# Patient Record
Sex: Male | Born: 1960 | Race: White | Hispanic: No | Smoking: Former smoker
Health system: Southern US, Community
[De-identification: ages and names within clinical notes are randomized; demographics above are authoritative.]

## PROBLEM LIST (undated history)

## (undated) DIAGNOSIS — N529 Male erectile dysfunction, unspecified: Secondary | ICD-10-CM

## (undated) DIAGNOSIS — T7840XA Allergy, unspecified, initial encounter: Secondary | ICD-10-CM

## (undated) DIAGNOSIS — M109 Gout, unspecified: Secondary | ICD-10-CM

## (undated) DIAGNOSIS — M1990 Unspecified osteoarthritis, unspecified site: Secondary | ICD-10-CM

## (undated) DIAGNOSIS — I10 Essential (primary) hypertension: Secondary | ICD-10-CM

## (undated) DIAGNOSIS — E785 Hyperlipidemia, unspecified: Secondary | ICD-10-CM

## (undated) DIAGNOSIS — K219 Gastro-esophageal reflux disease without esophagitis: Secondary | ICD-10-CM

## (undated) DIAGNOSIS — C4491 Basal cell carcinoma of skin, unspecified: Secondary | ICD-10-CM

## (undated) DIAGNOSIS — G4700 Insomnia, unspecified: Secondary | ICD-10-CM

## (undated) DIAGNOSIS — F419 Anxiety disorder, unspecified: Secondary | ICD-10-CM

## (undated) DIAGNOSIS — Z9889 Other specified postprocedural states: Secondary | ICD-10-CM

## (undated) DIAGNOSIS — R112 Nausea with vomiting, unspecified: Secondary | ICD-10-CM

## (undated) DIAGNOSIS — M25532 Pain in left wrist: Secondary | ICD-10-CM

## (undated) DIAGNOSIS — C801 Malignant (primary) neoplasm, unspecified: Secondary | ICD-10-CM

## (undated) HISTORY — PX: OTHER SURGICAL HISTORY: SHX169

## (undated) HISTORY — PX: COLONOSCOPY: SHX174

## (undated) HISTORY — DX: Unspecified osteoarthritis, unspecified site: M19.90

## (undated) HISTORY — PX: JOINT REPLACEMENT: SHX530

## (undated) HISTORY — DX: Hyperlipidemia, unspecified: E78.5

## (undated) HISTORY — DX: Male erectile dysfunction, unspecified: N52.9

## (undated) HISTORY — DX: Gastro-esophageal reflux disease without esophagitis: K21.9

## (undated) HISTORY — DX: Pain in left wrist: M25.532

## (undated) HISTORY — DX: Essential (primary) hypertension: I10

## (undated) HISTORY — DX: Insomnia, unspecified: G47.00

## (undated) HISTORY — DX: Allergy, unspecified, initial encounter: T78.40XA

## (undated) HISTORY — PX: TOTAL HIP ARTHROPLASTY: SHX124

## (undated) HISTORY — DX: Malignant (primary) neoplasm, unspecified: C80.1

## (undated) HISTORY — DX: Basal cell carcinoma of skin, unspecified: C44.91

## (undated) HISTORY — DX: Anxiety disorder, unspecified: F41.9

## (undated) HISTORY — DX: Gout, unspecified: M10.9

## (undated) HISTORY — PX: TONSILLECTOMY: SUR1361

---

## 1998-06-10 ENCOUNTER — Encounter (HOSPITAL_COMMUNITY): Admission: RE | Admit: 1998-06-10 | Discharge: 1998-09-08 | Payer: Self-pay | Admitting: Psychiatry

## 2002-03-16 ENCOUNTER — Encounter (INDEPENDENT_AMBULATORY_CARE_PROVIDER_SITE_OTHER): Payer: Self-pay

## 2002-03-16 ENCOUNTER — Ambulatory Visit (HOSPITAL_COMMUNITY): Admission: RE | Admit: 2002-03-16 | Discharge: 2002-03-16 | Payer: Self-pay | Admitting: Gastroenterology

## 2003-03-26 ENCOUNTER — Encounter: Payer: Self-pay | Admitting: Internal Medicine

## 2004-03-15 ENCOUNTER — Ambulatory Visit: Payer: Self-pay | Admitting: Internal Medicine

## 2004-07-05 ENCOUNTER — Ambulatory Visit: Payer: Self-pay | Admitting: Internal Medicine

## 2004-07-20 ENCOUNTER — Ambulatory Visit: Payer: Self-pay | Admitting: Internal Medicine

## 2004-09-13 ENCOUNTER — Ambulatory Visit: Payer: Self-pay | Admitting: Internal Medicine

## 2004-10-09 ENCOUNTER — Ambulatory Visit: Payer: Self-pay | Admitting: Internal Medicine

## 2005-04-19 ENCOUNTER — Encounter: Admission: RE | Admit: 2005-04-19 | Discharge: 2005-04-19 | Payer: Self-pay | Admitting: General Surgery

## 2005-06-08 ENCOUNTER — Ambulatory Visit (HOSPITAL_COMMUNITY): Admission: RE | Admit: 2005-06-08 | Discharge: 2005-06-08 | Payer: Self-pay | Admitting: Surgery

## 2006-04-15 ENCOUNTER — Ambulatory Visit: Payer: Self-pay | Admitting: Internal Medicine

## 2006-04-23 ENCOUNTER — Ambulatory Visit: Payer: Self-pay | Admitting: Internal Medicine

## 2006-04-23 LAB — CONVERTED CEMR LAB
BUN: 15 mg/dL (ref 6–23)
Chol/HDL Ratio, serum: 6.6
Cholesterol: 228 mg/dL (ref 0–200)
Glucose, Bld: 95 mg/dL (ref 70–99)
HDL: 34.4 mg/dL — ABNORMAL LOW (ref >39.0)
LDL DIRECT: 175.7 mg/dL
Triglyceride fasting, serum: 89 mg/dL (ref 0–149)
VLDL: 18 mg/dL (ref 0–40)

## 2006-05-07 HISTORY — PX: HERNIA REPAIR: SHX51

## 2006-05-15 ENCOUNTER — Ambulatory Visit: Payer: Self-pay | Admitting: Internal Medicine

## 2006-05-15 LAB — CONVERTED CEMR LAB: Total CK: 209 units/L (ref 7–195)

## 2006-05-28 ENCOUNTER — Encounter: Admission: RE | Admit: 2006-05-28 | Discharge: 2006-05-28 | Payer: Self-pay | Admitting: Surgery

## 2006-05-30 ENCOUNTER — Ambulatory Visit: Payer: Self-pay | Admitting: Internal Medicine

## 2006-06-07 ENCOUNTER — Encounter: Admission: RE | Admit: 2006-06-07 | Discharge: 2006-06-07 | Payer: Self-pay | Admitting: Surgery

## 2006-07-16 ENCOUNTER — Encounter: Admission: RE | Admit: 2006-07-16 | Discharge: 2006-07-16 | Payer: Self-pay | Admitting: Surgery

## 2006-11-20 ENCOUNTER — Ambulatory Visit: Payer: Self-pay | Admitting: Internal Medicine

## 2006-11-20 DIAGNOSIS — R109 Unspecified abdominal pain: Secondary | ICD-10-CM | POA: Insufficient documentation

## 2006-12-02 ENCOUNTER — Telehealth (INDEPENDENT_AMBULATORY_CARE_PROVIDER_SITE_OTHER): Payer: Self-pay | Admitting: *Deleted

## 2006-12-03 ENCOUNTER — Telehealth (INDEPENDENT_AMBULATORY_CARE_PROVIDER_SITE_OTHER): Payer: Self-pay | Admitting: *Deleted

## 2006-12-05 ENCOUNTER — Encounter: Payer: Self-pay | Admitting: Internal Medicine

## 2006-12-19 ENCOUNTER — Encounter (INDEPENDENT_AMBULATORY_CARE_PROVIDER_SITE_OTHER): Payer: Self-pay | Admitting: *Deleted

## 2007-01-16 ENCOUNTER — Encounter: Payer: Self-pay | Admitting: Internal Medicine

## 2007-04-17 ENCOUNTER — Encounter: Payer: Self-pay | Admitting: Internal Medicine

## 2007-08-07 ENCOUNTER — Telehealth (INDEPENDENT_AMBULATORY_CARE_PROVIDER_SITE_OTHER): Payer: Self-pay | Admitting: *Deleted

## 2008-01-09 ENCOUNTER — Encounter
Admission: RE | Admit: 2008-01-09 | Discharge: 2008-04-08 | Payer: Self-pay | Admitting: Physical Medicine & Rehabilitation

## 2008-01-13 ENCOUNTER — Ambulatory Visit: Payer: Self-pay | Admitting: Physical Medicine & Rehabilitation

## 2008-02-05 ENCOUNTER — Ambulatory Visit: Payer: Self-pay | Admitting: Physical Medicine & Rehabilitation

## 2008-02-26 ENCOUNTER — Encounter: Payer: Self-pay | Admitting: Internal Medicine

## 2008-04-23 ENCOUNTER — Ambulatory Visit: Payer: Self-pay | Admitting: Internal Medicine

## 2008-04-23 DIAGNOSIS — G5600 Carpal tunnel syndrome, unspecified upper limb: Secondary | ICD-10-CM | POA: Insufficient documentation

## 2008-04-23 DIAGNOSIS — E785 Hyperlipidemia, unspecified: Secondary | ICD-10-CM | POA: Insufficient documentation

## 2008-04-23 LAB — CONVERTED CEMR LAB
Free T4: 0.8 ng/dL (ref 0.6–1.6)
TSH: 3 microintl units/mL (ref 0.35–5.50)

## 2008-04-26 ENCOUNTER — Encounter (INDEPENDENT_AMBULATORY_CARE_PROVIDER_SITE_OTHER): Payer: Self-pay | Admitting: *Deleted

## 2008-06-09 ENCOUNTER — Ambulatory Visit: Payer: Self-pay | Admitting: Internal Medicine

## 2008-06-09 LAB — CONVERTED CEMR LAB
AST: 20 units/L (ref 0–37)
Alkaline Phosphatase: 64 units/L (ref 39–117)
Bilirubin, Direct: 0.1 mg/dL (ref 0.0–0.3)
Total Protein: 7.2 g/dL (ref 6.0–8.3)
VLDL: 26 mg/dL (ref 0–40)

## 2008-06-15 ENCOUNTER — Ambulatory Visit: Payer: Self-pay | Admitting: Internal Medicine

## 2008-06-15 DIAGNOSIS — F528 Other sexual dysfunction not due to a substance or known physiological condition: Secondary | ICD-10-CM | POA: Insufficient documentation

## 2008-06-15 DIAGNOSIS — M25551 Pain in right hip: Secondary | ICD-10-CM | POA: Insufficient documentation

## 2008-06-15 DIAGNOSIS — M2550 Pain in unspecified joint: Secondary | ICD-10-CM | POA: Insufficient documentation

## 2008-06-15 LAB — CONVERTED CEMR LAB
HDL goal, serum: 40 mg/dL
LDL Goal: 100 mg/dL

## 2008-06-18 ENCOUNTER — Telehealth (INDEPENDENT_AMBULATORY_CARE_PROVIDER_SITE_OTHER): Payer: Self-pay | Admitting: *Deleted

## 2008-06-18 ENCOUNTER — Encounter (INDEPENDENT_AMBULATORY_CARE_PROVIDER_SITE_OTHER): Payer: Self-pay | Admitting: *Deleted

## 2008-06-18 LAB — CONVERTED CEMR LAB
Free T4: 0.8 ng/dL (ref 0.6–1.6)
Sed Rate: 7 mm/hr (ref 0–16)
TSH: 3.39 microintl units/mL (ref 0.35–5.50)
Uric Acid, Serum: 8.4 mg/dL — ABNORMAL HIGH (ref 4.0–7.8)

## 2008-06-21 ENCOUNTER — Telehealth (INDEPENDENT_AMBULATORY_CARE_PROVIDER_SITE_OTHER): Payer: Self-pay | Admitting: *Deleted

## 2008-06-23 ENCOUNTER — Telehealth (INDEPENDENT_AMBULATORY_CARE_PROVIDER_SITE_OTHER): Payer: Self-pay | Admitting: *Deleted

## 2008-09-17 ENCOUNTER — Ambulatory Visit: Payer: Self-pay | Admitting: Internal Medicine

## 2008-09-30 LAB — CONVERTED CEMR LAB
LDL Cholesterol: 134 mg/dL — ABNORMAL HIGH (ref 0–99)
Uric Acid, Serum: 8 mg/dL — ABNORMAL HIGH (ref 4.0–7.8)
VLDL: 34 mg/dL (ref 0.0–40.0)

## 2008-10-07 ENCOUNTER — Telehealth (INDEPENDENT_AMBULATORY_CARE_PROVIDER_SITE_OTHER): Payer: Self-pay | Admitting: *Deleted

## 2008-10-11 ENCOUNTER — Ambulatory Visit: Payer: Self-pay | Admitting: Internal Medicine

## 2008-10-11 DIAGNOSIS — M109 Gout, unspecified: Secondary | ICD-10-CM | POA: Insufficient documentation

## 2008-10-11 LAB — CONVERTED CEMR LAB: LDL Goal: 115 mg/dL

## 2009-04-19 ENCOUNTER — Encounter: Payer: Self-pay | Admitting: Internal Medicine

## 2009-04-19 ENCOUNTER — Telehealth (INDEPENDENT_AMBULATORY_CARE_PROVIDER_SITE_OTHER): Payer: Self-pay | Admitting: *Deleted

## 2009-06-08 ENCOUNTER — Ambulatory Visit: Payer: Self-pay | Admitting: Internal Medicine

## 2009-06-08 DIAGNOSIS — R071 Chest pain on breathing: Secondary | ICD-10-CM | POA: Insufficient documentation

## 2009-06-08 DIAGNOSIS — R1011 Right upper quadrant pain: Secondary | ICD-10-CM | POA: Insufficient documentation

## 2009-06-09 LAB — CONVERTED CEMR LAB
Basophils Absolute: 0 10*3/uL (ref 0.0–0.1)
Lymphocytes Relative: 26.3 % (ref 12.0–46.0)
Monocytes Relative: 9.3 % (ref 3.0–12.0)
Platelets: 135 10*3/uL — ABNORMAL LOW (ref 150.0–400.0)
RDW: 12.4 % (ref 11.5–14.6)

## 2009-07-11 ENCOUNTER — Telehealth (INDEPENDENT_AMBULATORY_CARE_PROVIDER_SITE_OTHER): Payer: Self-pay | Admitting: *Deleted

## 2009-10-16 ENCOUNTER — Encounter: Payer: Self-pay | Admitting: Internal Medicine

## 2009-10-19 ENCOUNTER — Telehealth (INDEPENDENT_AMBULATORY_CARE_PROVIDER_SITE_OTHER): Payer: Self-pay | Admitting: *Deleted

## 2009-10-24 ENCOUNTER — Telehealth (INDEPENDENT_AMBULATORY_CARE_PROVIDER_SITE_OTHER): Payer: Self-pay | Admitting: *Deleted

## 2009-12-19 ENCOUNTER — Telehealth (INDEPENDENT_AMBULATORY_CARE_PROVIDER_SITE_OTHER): Payer: Self-pay | Admitting: *Deleted

## 2009-12-30 ENCOUNTER — Ambulatory Visit: Payer: Self-pay | Admitting: Internal Medicine

## 2009-12-30 ENCOUNTER — Telehealth (INDEPENDENT_AMBULATORY_CARE_PROVIDER_SITE_OTHER): Payer: Self-pay | Admitting: *Deleted

## 2009-12-30 DIAGNOSIS — R079 Chest pain, unspecified: Secondary | ICD-10-CM | POA: Insufficient documentation

## 2009-12-30 DIAGNOSIS — R002 Palpitations: Secondary | ICD-10-CM | POA: Insufficient documentation

## 2010-01-02 ENCOUNTER — Ambulatory Visit: Payer: Self-pay | Admitting: Internal Medicine

## 2010-01-03 LAB — CONVERTED CEMR LAB
Albumin: 4.4 g/dL (ref 3.5–5.2)
Alkaline Phosphatase: 61 units/L (ref 39–117)
Basophils Relative: 0.3 % (ref 0.0–3.0)
CO2: 28 meq/L (ref 19–32)
Chloride: 103 meq/L (ref 96–112)
Eosinophils Absolute: 0 10*3/uL (ref 0.0–0.7)
Free T4: 0.9 ng/dL (ref 0.60–1.60)
Hemoglobin: 14.9 g/dL (ref 13.0–17.0)
Lymphocytes Relative: 35.2 % (ref 12.0–46.0)
MCHC: 34.4 g/dL (ref 30.0–36.0)
MCV: 95.2 fL (ref 78.0–100.0)
Monocytes Absolute: 0.4 10*3/uL (ref 0.1–1.0)
Neutro Abs: 2.2 10*3/uL (ref 1.4–7.7)
Potassium: 3.9 meq/L (ref 3.5–5.1)
RBC: 4.54 M/uL (ref 4.22–5.81)
Sodium: 141 meq/L (ref 135–145)
Total CHOL/HDL Ratio: 6
Total Protein: 6.6 g/dL (ref 6.0–8.3)
Triglycerides: 118 mg/dL (ref 0.0–149.0)

## 2010-01-20 ENCOUNTER — Telehealth (INDEPENDENT_AMBULATORY_CARE_PROVIDER_SITE_OTHER): Payer: Self-pay | Admitting: *Deleted

## 2010-03-11 ENCOUNTER — Encounter: Payer: Self-pay | Admitting: Internal Medicine

## 2010-05-27 ENCOUNTER — Encounter: Payer: Self-pay | Admitting: General Surgery

## 2010-06-08 NOTE — Progress Notes (Signed)
Summary: refuse ED appt today  Phone Note Call from Patient   Caller: Patient Complaint: Urinary/GYN Problems Summary of Call: pt c/o dizziness, sob, chest pain x1week. pt denies any numbness,or tingling. pt BP today was 150/80. pt advise ED/UC pt decline stating that he would like to schedule ov with dr hopper early this week. Pt states that he does not feel that it is an emergency and in the past he has had issue with acid reflux. Advise pt with symptoms he needs to be seen some where today. . pt schedule for ov today with hopper......................Marland KitchenFelecia Deloach CMA  December 30, 2009 2:33 PM   Follow-up for Phone Call        Noted, patient in office Follow-up by: Shonna Chock CMA,  December 30, 2009 3:13 PM

## 2010-06-08 NOTE — Medication Information (Signed)
Summary: Noncompliance with Crestor/BCBS  Noncompliance with Crestor/BCBS   Imported By: Lanelle Bal 11/28/2009 12:46:31  _____________________________________________________________________  External Attachment:    Type:   Image     Comment:   External Document

## 2010-06-08 NOTE — Progress Notes (Signed)
Summary: REFILL REQUEST  Phone Note Refill Request Call back at [phone] Message from:  Pharmacy on December 19, 2009 9:48 AM  Refills Requested: Medication #1:  CRESTOR 20 MG TABS 1 tab once daily**ADDITIONAL REFILLS REQUIRE AN APPOINTMENT**   Dosage confirmed as above?Dosage Confirmed   Supply Requested: 1 month   Last Refilled: 10/24/2009 [facility]  Next Appointment Scheduled: NONE Initial call taken by: Lavell Islam,  December 19, 2009 9:49 AM    Prescriptions: CRESTOR 20 MG TABS (ROSUVASTATIN CALCIUM) 1 tab once daily**ADDITIONAL REFILLS REQUIRE AN APPOINTMENT**  #30 x 0   Entered by:   Shonna Chock CMA   Authorized by:   Marga Melnick MD   Signed by:   Shonna Chock CMA on 12/19/2009   Method used:   Electronically to        [facility]* (retail)       [address]       [place], [place]  [id number]       Ph: [phone]       Fax: [phone]   RxID:   [id number]

## 2010-06-08 NOTE — Progress Notes (Signed)
Summary: refill  Phone Note Refill Request Message from:  Fax from Pharmacy on January 20, 2010 9:28 AM  Refills Requested: Medication #1:  CRESTOR 20 MG TABS 1 tab once daily**ADDITIONAL REFILLS REQUIRE AN APPOINTMENT** [place] - fax [phone]  Next Appointment Scheduled: none Initial call taken by: Okey Regal Spring,  January 20, 2010 9:29 AM    New/Updated Medications: CRESTOR 20 MG TABS (ROSUVASTATIN CALCIUM) 1 tab once daily Prescriptions: CRESTOR 20 MG TABS (ROSUVASTATIN CALCIUM) 1 tab once daily  #30 x 11   Entered by:   Shonna Chock CMA   Authorized by:   Marga Melnick MD   Signed by:   Shonna Chock CMA on 01/20/2010   Method used:   Electronically to        [facility]* (retail)       [address]       [place], [place]  [id number]       Ph: [phone]       Fax: [phone]   RxID:   [id number]

## 2010-06-08 NOTE — Medication Information (Signed)
Summary: Noncompliance with Crestor/BCBS  Noncompliance with Crestor/BCBS   Imported By: Lanelle Bal 04/07/2010 09:55:16  _____________________________________________________________________  External Attachment:    Type:   Image     Comment:   External Document

## 2010-06-08 NOTE — Progress Notes (Signed)
Summary: refill  Phone Note Refill Request Message from:  Fax from Pharmacy on October 19, 2009 12:46 PM  Refills Requested: Medication #1:  VIAGRA 100 MG TABS 1/2 to 1 once daily as needed [place] [id number] ---phone [phone]  Initial call taken by: Okey Regal Spring,  October 19, 2009 12:48 PM    Prescriptions: VIAGRA 100 MG TABS (SILDENAFIL CITRATE) 1/2 to 1 once daily as needed  #6 Each x 2   Entered by:   Shonna Chock   Authorized by:   Marga Melnick MD   Signed by:   Shonna Chock on 10/19/2009   Method used:   Electronically to        [facility]* (retail)       [address]       [place], [place]  [id number]       Ph: [phone]       Fax: [phone]   RxID:   [id number]

## 2010-06-08 NOTE — Progress Notes (Signed)
Summary: refill  Phone Note Refill Request Message from:  Fax from Pharmacy on July 11, 2009 8:23 AM  Refills Requested: Medication #1:  VIAGRA 100 MG TABS 1/2 to 1 once daily as needed [place] fax [phone]   Method Requested: Fax to Local Pharmacy Next Appointment Scheduled: no appt Initial call taken by: Barb Merino,  July 11, 2009 8:24 AM    Prescriptions: VIAGRA 100 MG TABS (SILDENAFIL CITRATE) 1/2 to 1 once daily as needed  #6 Each x 0   Entered by:   Shonna Chock   Authorized by:   Marga Melnick MD   Signed by:   Shonna Chock on 07/11/2009   Method used:   Electronically to        [facility]* (retail)       [address]       [place], [place]  [address]       Ph: [phone]       Fax: [phone]   RxID:   [id number]
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
## 2010-06-08 NOTE — Assessment & Plan Note (Signed)
Summary: ABDOMINAL PAIN/KDC   Vital Signs:  Patient profile:   50 year old male Weight:      190 pounds Temp:     98.6 degrees F oral Pulse rate:   72 / minute Resp:     16 per minute BP sitting:   130 / 80  (left arm)  Vitals Entered By: Jeremy Johann CMA (June 08, 2009 12:38 PM) CC: fell hurt rt side skiing, tenderness to touch, no bruising or swelling, Abdominal pain Comments REVIEWED MED LIST, PATIENT AGREED DOSE AND INSTRUCTION CORRECT     CC:  fell hurt rt side skiing, tenderness to touch, no bruising or swelling, and Abdominal pain.  History of Present Illness:  Abdominal Pain      This is a 50 year old man who presents with Abdominal pain which began 1 week ago. He fell skiing & bruised R buttock 2weeks ago. Dermatologist removed lesion from RUQ   16  ago, "probably not related".  The patient denies nausea, vomiting, diarrhea, constipation, melena, hematochezia, anorexia, and hematemesis.  The location of the pain is right upper quadrant.  The pain is described as constant and sharp.  Associated symptoms include chest pain as of 01/27 with inspiratory pain.  The patient denies the following symptoms: fever, weight loss, dysuria, jaundice, and dark urine.  The pain is worse with sitting &  jarring of the abdomen.Pain better standing  & with NSAIDS  Allergies: 1)  Prozac 2)  Effexor 3)  Lipitor 4)  * Vytorin 5)  * Crestor 6)  * Citalopram  Review of Systems General:  Denies chills, fever, and sweats. Resp:  Complains of chest pain with inspiration and pleuritic; denies coughing up blood, shortness of breath, and wheezing. GU:  Denies discharge and hematuria.  Physical Exam  General:  Well-developed,well-nourished,in no acute distress; alert,appropriate and cooperative throughout examination Eyes:  No corneal or conjunctival inflammation noted.No icterus. Perrla. Mouth:  Oral mucosa and oropharynx without lesions or exudates.  Teeth in good repair. No pharyngeal  erythema.   Chest Wall:  Very tender R inferior rib cage Lungs:  Normal respiratory effort, chest expands symmetrically but some splinting. Lungs are clear to auscultation, no crackles or wheezes. Heart:  regular rhythm and bradycardia.  S4 Abdomen:  Bowel sounds positive,abdomen soft but MINIMALLY tender RUQ  without masses, organomegaly or hernias noted. Msk:  No deformity or scoliosis noted of thoracic or lumbar spine.  No flank tenderness Skin:  15X 8 cm resolving ecchymosis R lateral thigh Cervical Nodes:  No lymphadenopathy noted Axillary Nodes:  No palpable lymphadenopathy Psych:  memory intact for recent and remote, normally interactive, and good eye contact.     Impression & Recommendations:  Problem # 1:  CHEST WALL PAIN, ACUTE (ICD-786.52) R/O fracture His updated medication list for this problem includes:    Aleve 220 Mg Tabs (Naproxen sodium) .Marland Kitchen... Take 2 tab once daily  Problem # 2:  ABDOMINAL PAIN, RIGHT UPPER QUADRANT (ICD-789.01)  probably referred from #1  Orders: T-Acute Abdomen (2 view w/ PA & Chest (16109UE) Venipuncture (45409) TLB-CBC Platelet - w/Differential (85025-CBCD) TLB-Udip ONLY (81003-UDIP)  Complete Medication List: 1)  Crestor 20 Mg Tabs (Rosuvastatin calcium) .Marland Kitchen.. 1 tab once daily 2)  Viagra 100 Mg Tabs (Sildenafil citrate) .... 1/2 to 1 once daily as needed 3)  Aleve 220 Mg Tabs (Naproxen sodium) .... Take 2 tab once daily 4)  Hydrocodone-acetaminophen 5-500 Mg Tabs (Hydrocodone-acetaminophen) .Marland Kitchen.. 1-2 q 4-6 hrs as needed  Patient  Instructions: 1)  Report Warning Signs as discussed. Prescriptions: HYDROCODONE-ACETAMINOPHEN 5-500 MG TABS (HYDROCODONE-ACETAMINOPHEN) 1-2 q 4-6 hrs as needed  #30 x 0   Entered and Authorized by:   Marga Melnick MD   Signed by:   Marga Melnick MD on 06/08/2009   Method used:   Print then Give to Patient   RxID:   [id number]   Appended Document: ABDOMINAL PAIN/KDC  Laboratory Results   Urine  Tests   Date/Time Reported: June 08, 2009 1:46 PM   Routine Urinalysis   Color: yellow Appearance: Clear Glucose: negative   (Normal Range: Negative) Bilirubin: negative   (Normal Range: Negative) Ketone: negative   (Normal Range: Negative) Spec. Gravity: <1.005   (Normal Range: 1.003-1.035) Blood: negative   (Normal Range: Negative) pH: 7.0   (Normal Range: 5.0-8.0) Protein: negative   (Normal Range: Negative) Urobilinogen: negative   (Normal Range: 0-1) Nitrite: negative   (Normal Range: Negative) Leukocyte Esterace: negative   (Normal Range: Negative)    Comments: Floydene Flock  June 08, 2009 1:46 PM

## 2010-06-08 NOTE — Progress Notes (Signed)
Summary: Refill Request  Phone Note Refill Request Call back at [phone] Message from:  Pharmacy on October 24, 2009 1:42 PM  Refills Requested: Medication #1:  CRESTOR 20 MG TABS 1 tab once daily   Dosage confirmed as above?Dosage Confirmed   Supply Requested: 1 month   Last Refilled: 09/17/2009 [facility]  Next Appointment Scheduled: none Initial call taken by: Lavell Islam,  October 24, 2009 1:42 PM  Follow-up for Phone Call        Patient needs to schedule a CPX Follow-up by: Shonna Chock,  October 24, 2009 1:53 PM    New/Updated Medications: CRESTOR 20 MG TABS (ROSUVASTATIN CALCIUM) 1 tab once daily**ADDITIONAL REFILLS REQUIRE AN APPOINTMENT** Prescriptions: CRESTOR 20 MG TABS (ROSUVASTATIN CALCIUM) 1 tab once daily**ADDITIONAL REFILLS REQUIRE AN APPOINTMENT**  #30 x 0   Entered by:   Shonna Chock   Authorized by:   Marga Melnick MD   Signed by:   Shonna Chock on 10/24/2009   Method used:   Electronically to        [facility]* (retail)       [address]       [place], [place]  [id number]       Ph: [phone]       Fax: [phone]   RxID:   [id number]

## 2010-09-19 NOTE — Assessment & Plan Note (Signed)
The patient is scheduled for iliohypogastric ilioinguinal nerve block.  The patient signed consent.  Area marked and prepped with Betadine.  Sterile drape.  While infiltrating an area 1-inch medial and inferior  today, as the patient became nauseated and stated that he could not go  through the procedure, we did inject approximately 3 mL of 1% lidocaine  infiltrating in that area.  We terminated the procedure.   I went over treatment options, which really at this point, just include  the topical agents including his Lidoderm and Voltaren gel.  I will see  him back on a p.r.n. basis.  He has reached maximum medical improvement  from what I can do for him.      Erick Colace, M.D.  Electronically Signed     AEK/MedQ  D:  02/05/2008 10:03:33  T:  02/05/2008 23:30:30  Job #:  [id number]   cc:   Martina Sinner, MD  Fax: [phone]

## 2010-09-19 NOTE — Group Therapy Note (Signed)
CONSULT REQUESTED BY:  Martina Sinner, MD   REASON FOR CONSULTATION:  Consult requested for evaluation of left  scrotal and groin pain.   CHIEF COMPLAINT:  Left-sided scrotal and groin pain as well as lower  abdominal pain on the left and proximal thigh pain on the left.   HISTORY:  This is a 50 year old male who underwent arthroscopic inguinal  hernia repair in January 2007.  He did well for about a year, but then  had a lifting injury at work, where he was pulling on something, and he  had onset of pain in the left hemiscrotum.  He has had pain that is  described as dull, stabbing, and aching.  This occurs intermittently and  it may go 3-4 weeks between flare-ups and his flare-ups will last 5 days  to weeks.  When they are bad, they are around 6/10 and interfere with  intercourse, they interfere with enjoyment of life.  It is difficult to  lay down flat.  He uses pressure or heat on that area to make it feel  better.   He has had urologic evaluation.  He has had bladder scan residual on a 3  mL with UA negative.  Testicular ultrasound, which is normal.  He has  had abdominal MRI showing no evidence of recurrent inguinal hernia.  He  did get reevaluated by Dr. Corliss Skains from [facility].  He  prescribed anti-inflammatories, but this has caused some gastric  irritation.  He has been trialed on the Lyrica for nerve pain initially  by Dr. Logan Bores.  He has also been trialed on amitriptyline, both of these  causes to have excessive sedation.  He has been trialed on Viagra for  erectile dysfunction, and this has been helpful for him.  He was trialed  on Valium as a low-dose muscle relaxant and this gave him excessive  grogginess.  There was a question whether he may have a pudendal nerve  entrapment syndrome per Dr. Logan Bores.  He was trialed with a cord block,  but he had a hematoma after that.   PAST MEDICAL HISTORY:  The patient gives a past medical history of drug  abuse and  alcohol abuse.  He states he cannot take any kind of narcotic  analgesic or anything else that would be addicted.   He is employed 60 hours a week.  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   He continues to be active in terms of running 12 miles a week.   SOCIAL HISTORY:  He is divorced.  Lives by himself.  Nonsmoker and  nondrinker as noted above.   FAMILY HISTORY:  Heart disease, high blood pressure, and  hypercholesterolemia.   PHYSICAL EXAMINATION:  GENERAL:  Mildly anxious male in no acute  distress.  VITAL SIGNS:  Blood pressure 184/80, pulse 77, respirations 18, and O2  sat 99% on room air.  His gait is normal.  EXTREMITIES:  Without edema.  He has normal coordination.  Normal deep  tendon reflexes.  Normal sensation in the lower extremities.  He has  some hypersensitivity to pinprick over the left hemiscrotum over the  right side.  He has some sensation of the inguinal ligament area on the  left side compared to the right side.  He has normal strength in the  lower extremities.  No pain with resistant hip flexion.  Good hip range  of motion.  BACK:  Full range of  motion with flexion, extension, lateral rotation,  and bending.   IMPRESSION:  1. 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  Also in the differential, my main concern is that his      inguinal pain would be an L1 radiculopathy, although he has no back      pain to believe me to think that this is likely.  2. Given the intermittent nature of the symptoms, this can be      difficult to establish which is the diagnosis.  He will be set up      for an iliohypogastric block under fluoroscopic guidance.  He      really has no type of activities that he can reliably exacerbate      his symptoms, since he does a lot of working, but we will have to      monitor him over at least a month's time.  Should he have      discernable relief and recurrence,  he maybe a candidate for      radiofrequency neurotomy of the iliohypogastric nerve.  3. In terms of medications, he should stay away from oral agents.  We      will trial him on some Lidoderm for the inguinal region and add      some Voltaren gel over the scrotal area.  He will use the gel      q.i.d. and the Lidoderm patch, on 12 and off 12.   Thank you for this interesting consultation.  I will keep you apprised  of his progress.      Erick Colace, M.D.  Electronically Signed     AEK/MedQ  D:  01/13/2008 16:36:21  T:  01/14/2008 06:24:04  Job #:  [id number]

## 2010-09-22 NOTE — Op Note (Signed)
Raymond Long, Raymond Long              ACCOUNT NO.:  [id number]   MEDICAL RECORD NO.:  [id number]          PATIENT TYPE:  AMB   LOCATION:  DAY                          FACILITY:  [facility]   PHYSICIAN:  Wilmon Arms. Corliss Skains, M.D. DATE OF BIRTH:  02-04-1961   DATE OF PROCEDURE:  06/08/2005  DATE OF DISCHARGE:                                 OPERATIVE REPORT   PREOPERATIVE DIAGNOSIS:  Bilateral inguinal hernias.   POSTOPERATIVE DIAGNOSIS:  Bilateral inguinal hernias.   PROCEDURE PERFORMED:  Laparoscopic preperitoneal bilateral inguinal hernia  repairs with mesh.   SURGEON:  Wilmon Arms. Tsuei, M.D.   ANESTHESIA:  General endotracheal.   INDICATIONS:  The patient is a 50 year old male who presented with pain in  his left testicle as well as right lower quadrant. He was examined and was  felt to have bilateral inguinal hernias. He underwent CT scan of the pelvis  which showed a small right inguinal hernia containing only fat. The patient  also had a large left inguinal hernia containing part of the sigmoid colon  with no evidence of incarceration or obstruction. The patient was then  referred to me for consideration of laparoscopic repair.   DESCRIPTION OF PROCEDURE:  The patient was brought to the operating room and  placed supine position operating table. After an adequate level general  endotracheal anesthesia was obtained, a Foley catheter was placed under  sterile technique. The patient's lower abdomen was shaved, prepped with  Betadine and draped in sterile fashion. A time-out was taken to assure the  proper patient and proper procedure. A small incision was created just below  his umbilicus to the right of midline. Dissection was carried down to the  anterior rectus sheath. The rectus sheath was opened transversely. The  rectus muscle was retracted laterally, and the retromuscular preperitoneal  space was bluntly entered. The dissection trocar was inserted towards the  pubic symphysis.  The trocar was then inflated and held in place for several  minutes. The scope was inside the balloon as it was inflated. Despite the  appropriate number of pumps to inflate the balloon, it seemed like the  balloon did not completely unfurl. The balloon was held in place for several  minutes for hemostasis and was deflated. The balloon was removed, and the  insufflation trocar was inserted into the same retromuscular space.  Pneumopreperitoneal was obtained by insufflating CO2, maintaining maximal  pressure of 15 mmHg. The laparoscope was inserted. The balloon had dissected  the preperitoneal space near the midline, but I had not really dissected  laterally at all. Two 5 mL ports were placed in the lower midline. We began  on the left side. Blunt dissection was used to try to dissect the peritoneum  away from the posterior rectus. This peritoneum appeared densely adherent as  would be expected given the size of his hernia. A small tear was created in  the peritoneum, and we were able to visualize the sigmoid colon. This did  not appear to be incarcerated in the inguinal hernia. A 2-0 Vicryl suture on  a ski needle was then used to  close the opening in the peritoneum. The  spermatic cord was circumferentially dissected, and the peritoneal hernia  sac was dissected away from the cord. There was no direct hernia defect  noted. We then turned our attention to the right side. A small amount of  preperitoneal fat was dissected free from the spermatic cord and was  reduced. The inferior epigastric vessels on the right did not adhere to the  anterior surface of the preperitoneal space. Therefore, we had to dissect  this free from the peritoneum and retract them anteriorly. A 3 inches x 6  inches Parietex mesh was inserted and unfurled in the right preperitoneal  space. We made sure to keep the epigastric vessels anterior to the mesh. The  mesh was secured beginning at the symphysis pubis with a  total of four  tacks. The reduced hernia sac and cord lipoma were posterior to the mesh. We  then turned our attention back to the left side. A 3-inch x 6-inch piece of  Parietex mesh was used to cover this area as well and was secured in similar  fashion with four tacks. Pneumopreperitoneum was then released under direct  visualization. Both hernia sacs were seen to be posterior to the mesh. The  trocars were removed. We then used retractors to visualize the posterior  rectus sheath. This was opened, and the peritoneal cava was entered to  release whatever gas had been trapped in the peritoneal space. The fascia  was then closed with a 0 Vicryl suture in running fashion. Four-0 Monocryl  was used to close the skin. All ports sites had previously been infiltrated  0.25% Marcaine. The patient was then extubated and brought to recovery room  in stable condition. All sponge, instrument and needle counts were correct.      Wilmon Arms. Tsuei, M.D.  Electronically Signed     MKT/MEDQ  D:  06/08/2005  T:  06/08/2005  Job:  [id number]

## 2011-02-14 ENCOUNTER — Other Ambulatory Visit: Payer: Self-pay | Admitting: Internal Medicine

## 2011-02-14 NOTE — Telephone Encounter (Signed)
Lipid/Hep 272.4/995.20  

## 2011-03-23 ENCOUNTER — Telehealth: Payer: Self-pay | Admitting: Internal Medicine

## 2011-03-23 NOTE — Telephone Encounter (Signed)
Patient over due for lab per pharmacy - last lab was [phone] - he is scheduled [phone] please place lab order

## 2011-03-23 NOTE — Telephone Encounter (Signed)
Please see refill from 02/14/11 (lab codes listed)

## 2011-03-26 ENCOUNTER — Telehealth: Payer: Self-pay | Admitting: Internal Medicine

## 2011-03-26 ENCOUNTER — Other Ambulatory Visit: Payer: Self-pay | Admitting: Internal Medicine

## 2011-03-26 ENCOUNTER — Other Ambulatory Visit (INDEPENDENT_AMBULATORY_CARE_PROVIDER_SITE_OTHER): Payer: Managed Care, Other (non HMO)

## 2011-03-26 DIAGNOSIS — E785 Hyperlipidemia, unspecified: Secondary | ICD-10-CM

## 2011-03-26 DIAGNOSIS — T887XXA Unspecified adverse effect of drug or medicament, initial encounter: Secondary | ICD-10-CM

## 2011-03-26 LAB — LIPID PANEL
Cholesterol: 180 mg/dL (ref 0–200)
HDL: 34.7 mg/dL — ABNORMAL LOW (ref >39.00)
Total CHOL/HDL Ratio: 5
Triglycerides: 175 mg/dL — ABNORMAL HIGH (ref 0.0–149.0)

## 2011-03-26 LAB — HEPATIC FUNCTION PANEL
ALT: 33 U/L (ref 0–53)
AST: 29 U/L (ref 0–37)
Albumin: 4.5 g/dL (ref 3.5–5.2)
Total Protein: 6.9 g/dL (ref 6.0–8.3)

## 2011-03-26 MED ORDER — ROSUVASTATIN CALCIUM 20 MG PO TABS: ORAL_TABLET | ORAL | Status: DC

## 2011-03-26 NOTE — Telephone Encounter (Signed)
Patient had lab [phone] - he needs rx called in [facility]

## 2011-03-26 NOTE — Telephone Encounter (Signed)
Additional refills to be given once labs results populate in the system

## 2011-03-26 NOTE — Progress Notes (Signed)
12  

## 2011-04-29 ENCOUNTER — Other Ambulatory Visit: Payer: Self-pay | Admitting: Internal Medicine

## 2011-04-30 NOTE — Telephone Encounter (Signed)
Patient needs to schedule a follow-up.

## 2011-05-04 ENCOUNTER — Encounter: Payer: Self-pay | Admitting: Internal Medicine

## 2011-05-04 ENCOUNTER — Ambulatory Visit (INDEPENDENT_AMBULATORY_CARE_PROVIDER_SITE_OTHER): Payer: Managed Care, Other (non HMO) | Admitting: Internal Medicine

## 2011-05-04 DIAGNOSIS — G479 Sleep disorder, unspecified: Secondary | ICD-10-CM

## 2011-05-04 DIAGNOSIS — Z8249 Family history of ischemic heart disease and other diseases of the circulatory system: Secondary | ICD-10-CM

## 2011-05-04 DIAGNOSIS — E785 Hyperlipidemia, unspecified: Secondary | ICD-10-CM

## 2011-05-04 MED ORDER — ROSUVASTATIN CALCIUM 20 MG PO TABS: 20.0000 mg | ORAL_TABLET | Freq: Every day | ORAL | Status: DC

## 2011-05-04 NOTE — Patient Instructions (Signed)
The most common cause of elevated triglycerides is the ingestion of sugar from high fructose corn syrup sources added to processed foods & drinks.  Eat a low-fat diet with lots of fruits and vegetables, up to 7-9 servings per day. Consume less than 40 grams of sugar per day from foods & drinks with High Fructose Corn Syrup (HFCS) sugar as #1,2,3 or # 4 on label.Whole Foods, [employer] & [employer] do not carry products with HFCS. Follow a  low carb nutrition program such as [place] or The New Sugar Busters  to prevent Diabetes progression . White carbohydrates (potatoes, rice, bread, and pasta) have a high spike of sugar and a high load of sugar. For example a  baked potato has a cup of sugar and a  french fry  2 teaspoons of sugar. Yams, wild  rice, whole grained bread &  wheat pasta have been much lower spike and load of  sugar. Portions should be the size of a deck of cards or your palm.  Please  schedule fasting Labs : Boston Heat Lipid Panel in 4-6 months. PLEASE BRING THESE INSTRUCTIONS TO FOLLOW UP  LAB APPOINTMENT.This will guarantee correct labs are drawn, eliminating need for repeat blood sampling ( needle sticks ! ). Diagnoses /Codes: 272.4, V17.3  To prevent sleep dysfunction follow these instructions for sleep hygiene. Do not read, watch TV, or eat in bed. Do not get into bed until you are ready to turn off the light &  to go to sleep. Do not ingest stimulants ( decongestants, diet pills, nicotine, caffeine) after the evening meal.

## 2011-05-04 NOTE — Progress Notes (Signed)
  Subjective:    Patient ID: Raymond Long, male    DOB: 14-Jul-1960, 50 y.o.   MRN: [id number]  HPI Dyslipidemia assessment: Prior Advanced Lipid Testing: NMR Lipoprofile 2004.   Family history of premature CAD/ MI: both parents prematuely .  Nutrition: good diet .  Exercise: runs 20 mp week & gym . Diabetes : no . HTN: no. Smoking history  : quit 2003 .   Meds: Crestor 20 g daily   Lab results reviewed : LDL 110, HDL 34.7.     Review of Systems ROS: fatigue: yes due to sleep issues ; chest pain : no ;claudication: no; palpitations: no; abd pain/bowel changes: LLQ discomfort due to adhesions, better with Acupuncture ; myalgias:no;  syncope : no ; memory loss: no;skin changes: scalp itchy due to folliculitis.  He has chronic sleep issues of early awakening. He denies reported snoring or apnea or restless leg symptoms. Melatonin has been partially beneficial.      Objective:   Physical Exam Gen.: Healthy and well-nourished in appearance. Alert, appropriate and cooperative throughout exam. Eyes: No corneal or conjunctival inflammation noted.  Neck: No deformities, masses, or tenderness noted. Thyroid normal. Lungs: Normal respiratory effort; chest expands symmetrically. Lungs are clear to auscultation without rales, wheezes, or increased work of breathing. Heart: Normal rate and rhythm. Normal S1 and S2. No gallop, click, or rub. S 4 murmur.                                                                                   Musculoskeletal/extremities: No clubbing, cyanosis, edema, or deformity noted. Nail health  good. Vascular: Carotid, radial artery, dorsalis pedis and  posterior tibial pulses are full and equal. No bruits present. Neurologic: Alert and oriented x3. Deep tendon reflexes symmetrical and normal.          Skin: Intact without suspicious lesions or rashes. Lymph: No cervical, axillary lymphadenopathy present. Psych: Mood and affect are normal. Normally interactive                                                                                          Assessment & Plan:   #1 dyslipidemia; dramatic decrease in risk with Crestor at present dose. Some nutritional component suggested. Past history of intolerance to Lipitor. Very strong family history of premature coronary disease.  Plan: Nutritional intervention recommended with no change in medicines. I recommend a fasting Boston Heart Panel after 4-6 months of nutritional changes.

## 2011-05-04 NOTE — Assessment & Plan Note (Signed)
LDL has decreased from a value of 2 04/16/2003 06/07/2008. This represents greater than 100% decrease in risk. His fluctuating triglycerides suggest some dietary component to the present dyslipidemia. Nutritional interventions discussed.

## 2011-06-25 ENCOUNTER — Telehealth: Payer: Self-pay | Admitting: Internal Medicine

## 2011-06-25 ENCOUNTER — Ambulatory Visit (INDEPENDENT_AMBULATORY_CARE_PROVIDER_SITE_OTHER): Payer: Managed Care, Other (non HMO) | Admitting: Family Medicine

## 2011-06-25 ENCOUNTER — Encounter: Payer: Self-pay | Admitting: Family Medicine

## 2011-06-25 VITALS — BP 130/82 | HR 79 | Temp 98.7°F | Wt 197.0 lb

## 2011-06-25 DIAGNOSIS — J40 Bronchitis, not specified as acute or chronic: Secondary | ICD-10-CM

## 2011-06-25 DIAGNOSIS — J329 Chronic sinusitis, unspecified: Secondary | ICD-10-CM

## 2011-06-25 MED ORDER — HYDROCOD POLST-CHLORPHEN POLST 10-8 MG/5ML PO LQCR: ORAL | Status: DC

## 2011-06-25 MED ORDER — CEFUROXIME AXETIL 500 MG PO TABS: 500.0000 mg | ORAL_TABLET | Freq: Two times a day (BID) | ORAL | Status: AC

## 2011-06-25 MED ORDER — FLUTICASONE PROPIONATE 50 MCG/ACT NA SUSP: NASAL | Status: DC

## 2011-06-25 NOTE — Telephone Encounter (Signed)
Patient was seen today by [facility]

## 2011-06-25 NOTE — Progress Notes (Signed)
  Subjective:     Raymond Long is a 51 y.o. male here for evaluation of a cough. Onset of symptoms was 5 days ago. Symptoms have been gradually worsening since that time. The cough is dry and painful and is aggravated by exercise and reclining position. Associated symptoms include: change in voice, postnasal drip, shortness of breath and wheezing. Patient does not have a history of asthma. Patient does not have a history of environmental allergens. Patient has not traveled recently. Patient does not have a history of smoking. Patient has not had a previous chest x-ray. Patient has not had a PPD done.  The following portions of the patient's history were reviewed and updated as appropriate: allergies, current medications, past family history, past medical history, past social history, past surgical history and problem list.  Review of Systems Pertinent items are noted in HPI.    Objective:    Oxygen saturation 98% on room air BP 130/82  Pulse 79  Temp(Src) 98.7 F (37.1 C) (Oral)  Wt 197 lb (89.359 kg)  SpO2 98% General appearance: alert, cooperative, appears stated age and no distress Ears: normal TM's and external ear canals both ears Nose: Nares normal. Septum midline. Mucosa normal. No drainage or sinus tenderness. Throat: abnormal findings: pnd Neck: mild anterior cervical adenopathy, supple, symmetrical, trachea midline and thyroid not enlarged, symmetric, no tenderness/mass/nodules Lungs: diminished breath sounds bilaterally Heart: S1, S2 normal    Assessment:    Acute Bronchitis    Plan:    Antibiotics per medication orders. Antitussives per medication orders. Avoid exposure to tobacco smoke and fumes. Call if shortness of breath worsens, blood in sputum, change in character of cough, development of fever or chills, inability to maintain nutrition and hydration. Avoid exposure to tobacco smoke and fumes. Trial of steroid nasal spray.  Neti pot

## 2011-06-25 NOTE — Patient Instructions (Signed)

## 2011-06-25 NOTE — Telephone Encounter (Signed)
Patient had an appt this afternoon at 1:00--[facility] Triage Call Report Triage Record Num: [id number] Operator: Tomasita Crumble Patient Name: Raymond Long Call Date & Time: 06/25/2011 8:21:11AM Patient Phone: [phone] PCP: Patient Gender: Male PCP Fax : Patient DOB: 22-Jul-1960 Practice Name: [facility] Reason for Call: Caller: Koltin/Patient; PCP: Marga Melnick; CB#: [phone]; ; Call regarding Cough/Congestion; Onset 06/20/11. Relates started off as "typical flu sx". Non productive cough not responsive to OTC meds. Theraflu, Zicam, Robitussin Multi Symptom Cough, Alka -Seltzer, Cepacol Lozenges, Ricola Lozenges, Ibuprofen. Sinus pain / pressure between eyes. Appt at 13:00 w/ Dr. Laury Axon. Home care for the interim per Cough protocol. Protocol(s) Used: Cough - Adult Recommended Outcome per Protocol: See Provider within 72 Hours Reason for Outcome: Had viral illness with a cough within last month that has not responded to home care Care Advice: ~ Use a cool mist humidifier to moisten air. Be sure to clean according to manufacturer's instructions. Increase fluids to 8-12 eight oz (1.6 to 2.4 liters) glasses per day, half of them to be water. Soups, popsicles, fruit juices, non-caffeinated sodas (unless restricting sodium intake), jello, broths, decaf teas, etc. are all okay. Warm fluids can be soothing. ~ ~ Warm fluids may help, or try a mixture of honey and lemon juice in warm tea.

## 2011-07-09 ENCOUNTER — Telehealth: Payer: Self-pay | Admitting: Internal Medicine

## 2011-07-09 DIAGNOSIS — Z8249 Family history of ischemic heart disease and other diseases of the circulatory system: Secondary | ICD-10-CM

## 2011-07-09 DIAGNOSIS — E785 Hyperlipidemia, unspecified: Secondary | ICD-10-CM

## 2011-07-09 MED ORDER — ROSUVASTATIN CALCIUM 20 MG PO TABS: 20.0000 mg | ORAL_TABLET | Freq: Every day | ORAL | Status: DC

## 2011-07-09 NOTE — Telephone Encounter (Signed)
Prescription sent to patient's pharmacy.

## 2011-07-09 NOTE — Telephone Encounter (Signed)
rx refill for     rosuvastatin (CRESTOR) 20 MG tablet    Send to [facility]

## 2011-09-25 ENCOUNTER — Other Ambulatory Visit: Payer: Self-pay | Admitting: Dermatology

## 2011-10-26 ENCOUNTER — Other Ambulatory Visit: Payer: Self-pay | Admitting: Dermatology

## 2011-12-11 ENCOUNTER — Ambulatory Visit (INDEPENDENT_AMBULATORY_CARE_PROVIDER_SITE_OTHER): Payer: Managed Care, Other (non HMO) | Admitting: Internal Medicine

## 2011-12-11 ENCOUNTER — Encounter: Payer: Self-pay | Admitting: Internal Medicine

## 2011-12-11 VITALS — BP 128/84 | HR 78 | Temp 98.0°F | Wt 200.0 lb

## 2011-12-11 DIAGNOSIS — R519 Headache, unspecified: Secondary | ICD-10-CM

## 2011-12-11 DIAGNOSIS — R030 Elevated blood-pressure reading, without diagnosis of hypertension: Secondary | ICD-10-CM

## 2011-12-11 DIAGNOSIS — R350 Frequency of micturition: Secondary | ICD-10-CM

## 2011-12-11 DIAGNOSIS — H9313 Tinnitus, bilateral: Secondary | ICD-10-CM

## 2011-12-11 DIAGNOSIS — T887XXA Unspecified adverse effect of drug or medicament, initial encounter: Secondary | ICD-10-CM | POA: Insufficient documentation

## 2011-12-11 DIAGNOSIS — R51 Headache: Secondary | ICD-10-CM

## 2011-12-11 DIAGNOSIS — H9319 Tinnitus, unspecified ear: Secondary | ICD-10-CM

## 2011-12-11 LAB — POCT URINALYSIS DIPSTICK
Bilirubin, UA: NEGATIVE
Glucose, UA: NEGATIVE
Leukocytes, UA: NEGATIVE
Nitrite, UA: NEGATIVE

## 2011-12-11 LAB — BASIC METABOLIC PANEL
GFR: 60.12 mL/min (ref >60.00)
Potassium: 3.7 mEq/L (ref 3.5–5.1)
Sodium: 140 mEq/L (ref 135–145)

## 2011-12-11 MED ORDER — TRAMADOL HCL 50 MG PO TABS: 50.0000 mg | ORAL_TABLET | Freq: Three times a day (TID) | ORAL | Status: AC | PRN

## 2011-12-11 NOTE — Patient Instructions (Addendum)
Please keep a diary of your headaches . Document  each occurrence on the calendar with notation of : #1 any prodrome ( any non headache symptom such as marked fatigue,visual changes, ,etc ) which precedes actual headache ; #2) severity on 1-10 scale; #3) any triggers ( food/ drink,enviromenntal or weather changes ,physical or emotional stress) in 8-12 hour period prior to the headache; & #4) response to any medications or other intervention. Please review "Headache" @ [facility] for additional information.    Blood Pressure Goal  Ideally is an AVERAGE < 135/85. This AVERAGE should be calculated from @ least 5-7 BP readings taken @ different times of day on different days of week. You should not respond to isolated BP readings , but rather the AVERAGE for that week . Please try to go on My Chart within the next 24 hours to allow me to release the results directly to you.

## 2011-12-11 NOTE — Progress Notes (Signed)
  Subjective:    Patient ID: Raymond Long, male    DOB: 1961/02/01, 51 y.o.   MRN: [id number]  HPI He questioned hypertension because of temporal, dull headaches intermittently almost daily  over the last 6 weeks. He feels the trigger may be stress. These are non-radiating & last 5 - 10 minutes. He has been treating these with increased amounts of Advil. He describes a prodrome of that is prior to the headaches.  He's been checking his blood pressure the drug stores. Blood pressures range from 118/30-190/30-40. The systolic has been as high as 200.    Review of Systems He denies associated nausea, vomiting, photophobia, or phonophobia. He also denies tearing of eyes, rhinitis, fever, chills, sweats, neck stiffness or pain, or weakness or numbness or tingling in extremities. There has been no associated blurred vision, double vision, loss of vision. He does have some swallowing difficulties at times but is not necessarily related to the headaches. There is no personal or family history of migraines. This is a new type of headache. He denies any trauma. He has noted some erectile dysfunction recently.He describes urgency with urination. He has been a vegetarian for almost a year and questions relationship to any symptoms He has not had chest pain, persistent palpitations, dyspnea, or claudication. He denies a constellation of headache, chest pain, flushing,& diarrhea     Objective:   Physical Exam  Gen. appearance: Well-nourished, in no distress Eyes: Extraocular motion intact, field of vision normal, vision grossly intact, no nystagmus. Fundi revealed arteriolar narrowing Nose: septum deviated to L ENT: Canals clear, tympanic membranes normal, tuning fork exam normal, hearing grossly normal Neck: Normal range of motion, no masses, normal thyroid Cardiovascular: Rate and rhythm normal; no murmurs, gallops or extra heart sounds. Accentuated S1 Muscle skeletal: Range of motion, tone, &   strength normal Neuro:no cranial nerve deficit, deep tendon  reflexes normal, gait normal. Rhomberg & finger to nose bialaterally Lymph: No cervical or axillary LA Skin: Warm and dry without suspicious lesions or rashes Psych: no anxiety or mood change. Normally interactive and cooperative.         Assessment & Plan:  #1 bitemporal headaches; excess NSAID use. Nonsteroidals could be causing the chronic daily headache.  #2 elevated blood pressure at the drugstore without diagnosis of hypertension. Blood pressure today is excellent  #3 tinnitus  #4 urinary frequency  Plan: It is critical for him to obtain a blood pressure cuff and monitor this at different times of the day and week. See labs and orders.

## 2012-01-29 ENCOUNTER — Other Ambulatory Visit: Payer: Self-pay | Admitting: Dermatology

## 2012-01-29 ENCOUNTER — Other Ambulatory Visit: Payer: Self-pay | Admitting: Internal Medicine

## 2012-01-30 ENCOUNTER — Other Ambulatory Visit: Payer: Self-pay

## 2012-01-30 MED ORDER — SILDENAFIL CITRATE 100 MG PO TABS: 100.0000 mg | ORAL_TABLET | Freq: Every day | ORAL | Status: DC | PRN

## 2012-01-30 NOTE — Telephone Encounter (Signed)
Rx sent.    MW 

## 2012-02-11 ENCOUNTER — Ambulatory Visit (INDEPENDENT_AMBULATORY_CARE_PROVIDER_SITE_OTHER): Payer: Managed Care, Other (non HMO) | Admitting: Family Medicine

## 2012-02-11 ENCOUNTER — Encounter: Payer: Self-pay | Admitting: Family Medicine

## 2012-02-11 VITALS — BP 144/90 | HR 75 | Temp 98.5°F | Resp 16 | Ht 70.0 in | Wt 199.0 lb

## 2012-02-11 DIAGNOSIS — IMO0001 Reserved for inherently not codable concepts without codable children: Secondary | ICD-10-CM

## 2012-02-11 DIAGNOSIS — R1032 Left lower quadrant pain: Secondary | ICD-10-CM
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
## 2012-02-25 ENCOUNTER — Encounter (INDEPENDENT_AMBULATORY_CARE_PROVIDER_SITE_OTHER): Payer: Self-pay | Admitting: Surgery

## 2012-03-03 ENCOUNTER — Ambulatory Visit (INDEPENDENT_AMBULATORY_CARE_PROVIDER_SITE_OTHER): Payer: BC Managed Care – PPO | Admitting: General Surgery

## 2012-03-03 ENCOUNTER — Encounter (INDEPENDENT_AMBULATORY_CARE_PROVIDER_SITE_OTHER): Payer: Self-pay | Admitting: General Surgery

## 2012-03-03 VITALS — BP 160/92 | HR 71 | Temp 98.1°F | Resp 16 | Ht 72.0 in | Wt 201.8 lb

## 2012-03-03 DIAGNOSIS — R1032 Left lower quadrant pain: Secondary | ICD-10-CM

## 2012-03-03 MED ORDER — CIPROFLOXACIN HCL 500 MG PO TABS: 500.0000 mg | ORAL_TABLET | Freq: Two times a day (BID) | ORAL | Status: AC

## 2012-03-03 NOTE — Progress Notes (Signed)
Patient ID: Raymond Long, male   DOB: [birth year], 51 y.o.   MRN: [id number]  No chief complaint on file.   HPI Raymond Long is a 51 y.o. male.   HPI  He is referred by Dr. Albertine Patricia for evaluation of left groin pain and possible left inguinal hernia. He has been having intermittent pelvic fullness for about the past year or two.  This has been associated with some pain and drying up of the left testicle during sexual intercourse and ejaculation. The left testicle feels  swollen and feels firm to him.  It is riding higher.  He has changed his activities and that he started do much more strenuous activity and become involved some competitive sports. He has had a change in his urination as well.  He denies blood in his urine. Dr. Chilton Si felt he may have a recurrent left inguinal hernia and has sent him here for further evaluation. He did have a laparoscopic bilateral inguinal hernia repair back in 2008 by Dr. Corliss Skains.  Past Medical History  Diagnosis Date  . Hyperlipidemia   . Cancer     Skin  . Gout     Foot  . Erectile dysfunction   . Arthralgia of wrist, left   . Insomnia     Past Surgical History  Procedure Date  . Hernia repair 2008    Bilateral  . Tonsillectomy age 57    No family history on file.  Social History History  Substance Use Topics  . Smoking status: Former Games developer  . Smokeless tobacco: Not on file   Comment: 9 years ago as of 2012   . Alcohol Use: No    Allergies  Allergen Reactions  . Atorvastatin   . Citalopram   . Ezetimibe-Simvastatin     REACTION: up cpk  . Fluoxetine Hcl   . Rosuvastatin     REACTION: up cpk  . Venlafaxine     Current Outpatient Prescriptions  Medication Sig Dispense Refill  . doxycycline (VIBRAMYCIN) 50 MG capsule Take 50 mg by mouth daily.      . hydrOXYzine (ATARAX/VISTARIL) 25 MG tablet Take 25 mg by mouth at bedtime.      . Melatonin 3 MG CAPS Take by mouth at bedtime.        . rosuvastatin (CRESTOR) 20 MG tablet  Take 1 tablet (20 mg total) by mouth daily.  90 tablet  2  . sildenafil (VIAGRA) 100 MG tablet Take 1 tablet (100 mg total) by mouth daily as needed.  10 tablet  1  . ciprofloxacin (CIPRO) 500 MG tablet Take 1 tablet (500 mg total) by mouth 2 (two) times daily.  20 tablet  0    Review of Systems Review of Systems  Constitutional: Negative for fever and chills.  Gastrointestinal: Negative.   Genitourinary: Positive for difficulty urinating and testicular pain (left).       Pelvic pain  Neurological: Positive for headaches.    Blood pressure 160/92, pulse 71, temperature 98.1 F (36.7 C), temperature source Temporal, resp. rate 16, height 6' (1.829 m), weight 201 lb 12.8 oz (91.536 kg).  Physical Exam Physical Exam  Constitutional: He appears well-developed and well-nourished. No distress.  HENT:  Head: Normocephalic and atraumatic.  Abdominal: Soft. He exhibits no distension and no mass. There is no tenderness.       Small subumbilical scars  Genitourinary:       Right testicle is normal without masses. No right inguinal bulge. Left testicle  is higher than right testicle and is tender. No left inguinal bulge.  On rectal exam, no masses are felt, the prostate is firm but is not significantly tender.    Data Reviewed Dr. Thomasene Lot note.  Assessment    Bilateral pelvic pain left side is more prominent than the right side. Also has left testicular discomfort.  No evidence of inguinal hernia. He has some change in his urinary habits.  Question whether this might be prostatitis.    Plan    Start empiric ciprofloxacin. Start moderate activities.  Refer to Urology.       Brendyn Mclaren J 03/03/2012, 2:47 PM

## 2012-03-03 NOTE — Addendum Note (Signed)
Addended by: Milas Hock on: 03/03/2012 03:06 PM   Modules accepted: Orders

## 2012-03-03 NOTE — Patient Instructions (Signed)
Avoid dairy heavy or very strenuous activities as we discussed. Stop taking doxycycline while you are taking ciprofloxacin.

## 2012-04-21 ENCOUNTER — Telehealth: Payer: Self-pay | Admitting: Internal Medicine

## 2012-04-21 DIAGNOSIS — E785 Hyperlipidemia, unspecified: Secondary | ICD-10-CM

## 2012-04-21 DIAGNOSIS — Z8249 Family history of ischemic heart disease and other diseases of the circulatory system: Secondary | ICD-10-CM

## 2012-04-21 DIAGNOSIS — T887XXA Unspecified adverse effect of drug or medicament, initial encounter: Secondary | ICD-10-CM

## 2012-04-21 MED ORDER — ROSUVASTATIN CALCIUM 20 MG PO TABS: ORAL_TABLET | ORAL | Status: DC

## 2012-04-21 MED ORDER — ROSUVASTATIN CALCIUM 20 MG PO TABS: 20.0000 mg | ORAL_TABLET | Freq: Every day | ORAL | Status: DC

## 2012-04-21 NOTE — Telephone Encounter (Signed)
OK X1 but additional refills require fasting labs & office visit to update medical history.  Please  schedule fasting Labs : BMET,Lipids, hepatic panel, CK, TSH. Codes: [phone]

## 2012-04-21 NOTE — Telephone Encounter (Signed)
Dr.Hopper please advise on request for Crestor for it is listed on Allergy/adverse reaction list (up CK, while on medication)

## 2012-04-21 NOTE — Telephone Encounter (Signed)
Refill: Crestor 20 mg tablet. Take 1 tablet once daily. Qty 30. Last fill 03-23-12

## 2012-04-21 NOTE — Telephone Encounter (Signed)
RX printed in error (rx was voided/shredded), RX #30/0 refills, indication labs due sent to [facility] (Future orders placed)

## 2012-05-30 ENCOUNTER — Other Ambulatory Visit: Payer: Self-pay | Admitting: Internal Medicine

## 2012-06-21 ENCOUNTER — Other Ambulatory Visit: Payer: Self-pay

## 2012-07-01 ENCOUNTER — Other Ambulatory Visit: Payer: Self-pay | Admitting: Internal Medicine

## 2012-07-01 NOTE — Telephone Encounter (Signed)
Future orders placed 

## 2012-07-14 ENCOUNTER — Other Ambulatory Visit: Payer: Self-pay | Admitting: Dermatology

## 2012-07-22 ENCOUNTER — Telehealth: Payer: Self-pay | Admitting: *Deleted

## 2012-07-22 NOTE — Telephone Encounter (Signed)
Patient left message on triage requesting refill on viagra, states insur company requires prior review. Contact number for insurance company [phone].

## 2012-07-23 NOTE — Telephone Encounter (Signed)
I called to initiate PA, #6 pills allowed but still requires PA  Approved until March 19,2015 Approval Number: [id number]

## 2012-07-23 NOTE — Telephone Encounter (Signed)
I called patient and left message on VM for patient to return call when available. Reason for call ( not left on VM) PA approved through July 23, 2013, ? If patient would like rx sent to [facility]

## 2012-07-24 ENCOUNTER — Other Ambulatory Visit: Payer: Self-pay | Admitting: Internal Medicine

## 2012-07-24 MED ORDER — SILDENAFIL CITRATE 100 MG PO TABS: 100.0000 mg | ORAL_TABLET | Freq: Every day | ORAL | Status: DC | PRN

## 2012-07-24 NOTE — Telephone Encounter (Signed)
Patient aware medication approved, rx sent to [facility]

## 2012-07-24 NOTE — Addendum Note (Signed)
Addended by: Maurice Small on: 07/24/2012 09:28 AM   Modules accepted: Orders

## 2012-09-16 ENCOUNTER — Other Ambulatory Visit: Payer: Self-pay | Admitting: Internal Medicine

## 2012-09-16 NOTE — Telephone Encounter (Signed)
FUTURE ORDER PLACED FOR LABS

## 2012-10-13 ENCOUNTER — Other Ambulatory Visit: Payer: Self-pay | Admitting: Internal Medicine

## 2012-10-23 ENCOUNTER — Other Ambulatory Visit: Payer: Self-pay | Admitting: Internal Medicine

## 2012-10-23 ENCOUNTER — Other Ambulatory Visit (INDEPENDENT_AMBULATORY_CARE_PROVIDER_SITE_OTHER): Payer: BC Managed Care – PPO

## 2012-10-23 DIAGNOSIS — T887XXA Unspecified adverse effect of drug or medicament, initial encounter: Secondary | ICD-10-CM

## 2012-10-23 DIAGNOSIS — E785 Hyperlipidemia, unspecified: Secondary | ICD-10-CM

## 2012-10-23 DIAGNOSIS — R7309 Other abnormal glucose: Secondary | ICD-10-CM

## 2012-10-23 LAB — LIPID PANEL
Cholesterol: 167 mg/dL (ref 0–200)
HDL: 33.5 mg/dL — ABNORMAL LOW (ref >39.00)
Triglycerides: 190 mg/dL — ABNORMAL HIGH (ref 0.0–149.0)
VLDL: 38 mg/dL (ref 0.0–40.0)

## 2012-10-23 LAB — BASIC METABOLIC PANEL
Calcium: 9.3 mg/dL (ref 8.4–10.5)
GFR: 70.86 mL/min (ref >60.00)
Glucose, Bld: 104 mg/dL — ABNORMAL HIGH (ref 70–99)
Potassium: 4.1 mEq/L (ref 3.5–5.1)
Sodium: 141 mEq/L (ref 135–145)

## 2012-10-23 LAB — HEPATIC FUNCTION PANEL
ALT: 28 U/L (ref 0–53)
AST: 22 U/L (ref 0–37)
Albumin: 4.4 g/dL (ref 3.5–5.2)
Total Protein: 7.2 g/dL (ref 6.0–8.3)

## 2012-10-23 LAB — TSH: TSH: 3 u[IU]/mL (ref 0.35–5.50)

## 2012-10-23 LAB — CK: Total CK: 192 U/L (ref 7–232)

## 2012-10-28 ENCOUNTER — Ambulatory Visit (INDEPENDENT_AMBULATORY_CARE_PROVIDER_SITE_OTHER): Payer: BC Managed Care – PPO | Admitting: Internal Medicine

## 2012-10-28 VITALS — BP 140/80 | HR 59 | Wt 201.0 lb

## 2012-10-28 DIAGNOSIS — R7301 Impaired fasting glucose: Secondary | ICD-10-CM

## 2012-10-28 DIAGNOSIS — E785 Hyperlipidemia, unspecified: Secondary | ICD-10-CM

## 2012-10-28 DIAGNOSIS — C4491 Basal cell carcinoma of skin, unspecified: Secondary | ICD-10-CM | POA: Insufficient documentation

## 2012-10-28 MED ORDER — ROSUVASTATIN CALCIUM 20 MG PO TABS: ORAL_TABLET | ORAL | Status: DC

## 2012-10-28 NOTE — Progress Notes (Signed)
Subjective:    Patient ID: Raymond Long, male    DOB: [birth year], 52 y.o.   MRN: [id number]  HPI   The patient is here for followup of  hyperlipidemia and hyperglycemia (FBS 104)  The most recent A1c was 5.3  , which correlates to an average sugar of 106 , and no long-term risk  . Last ophthalmologic examination 3/14  revealed no retinopathy. Diet is  heart healthy. Exercise as running & cycling for 7-8 hrs/ week .  The most recent lipids   reveal LDL 96 ; HDL 33.5 ; and triglycerides  190 . There is medical compliance with the statin; Crestor 20 mg daily  No  medication adverse effects suggested.      Review of Systems  Constitutional: No  significant weight change;weight up 7 #. No  excess fatigue Eyes: No  blurred vision;  double vision ; loss of vision Cardiovascular: no chest pain ;palpitations; racing; irregular rhythm ;syncope; claudication ; edema  Respiratory: No exertional dyspnea;  paroxysmal nocturnal dyspnea Genitourinary: No dysuria; hematuria ; pyuria; frequency;   nocturia Musculoskeletal: No myalgias or muscle cramping  Dermatologic: Multiple basal cell cancers of scalp , thorax, & extremities .No other change in color or temperature of skin Neurologic: No  limb weakness. Positional L thigh tingling when supine @ night for 60 minutes. No numbness or  burning Endocrine: No change in hair, skin, nails other than basal cells. No excessive thirst; excessive hunger;  excessive urination      Objective:   Physical Exam Gen.: Healthy and well-nourished in appearance. Alert, appropriate and cooperative throughout exam. Head: Normocephalic without obvious abnormalities;  pattern alopecia  Eyes: No corneal or conjunctival inflammation noted. Extraocular motion intact. Vision grossly normal with lenses Nose: External nasal exam reveals no deformity or inflammation. Nasal mucosa are pink and moist. No lesions or exudates noted.   Mouth: Oral mucosa and oropharynx  reveal no lesions or exudates. Teeth in good repair. Neck: No deformities, masses, or tenderness noted.  Thyroid normal. Lungs: Normal respiratory effort; chest expands symmetrically. Lungs are clear to auscultation without rales, wheezes, or increased work of breathing. Heart: Normal rate and rhythm. Normal S1 and S2. No gallop, click, or rub.S4 w/o murmur. Abdomen: Bowel sounds normal; abdomen soft and nontender. No masses, organomegaly or hernias noted.                        Musculoskeletal/extremities: No deformity or scoliosis noted of  the thoracic or lumbar spine.  No clubbing, cyanosis, edema, or significant extremity  deformity noted. Range of motion normal .Tone & strength  Normal. Joints normal . Nail health good. Able to lie down & sit up w/o help.  Vascular: Carotid, radial artery, dorsalis pedis and  posterior tibial pulses are full and equal. No bruits present. Neurologic: Alert and oriented x3. Deep tendon reflexes symmetrical and normal.         Skin: Scattered red lesions especially limbs Lymph: No cervical, axillary lymphadenopathy present. Psych: Mood and affect are normal. Normally interactive  Assessment & Plan:  See Current Assessment & Plan in Problem List under specific Diagnosis

## 2012-10-28 NOTE — Assessment & Plan Note (Signed)
Options discussed[facility]

## 2012-10-28 NOTE — Assessment & Plan Note (Signed)
Nutritional interventions discussed; no change in Crestor dose

## 2012-10-28 NOTE — Patient Instructions (Addendum)
Share results with all non [facility] medical staff seen  

## 2012-12-23 ENCOUNTER — Other Ambulatory Visit: Payer: Self-pay | Admitting: Dermatology

## 2013-01-07 ENCOUNTER — Ambulatory Visit (INDEPENDENT_AMBULATORY_CARE_PROVIDER_SITE_OTHER): Payer: BC Managed Care – PPO | Admitting: General Surgery

## 2013-01-07 ENCOUNTER — Encounter (INDEPENDENT_AMBULATORY_CARE_PROVIDER_SITE_OTHER): Payer: Self-pay | Admitting: General Surgery

## 2013-01-07 VITALS — BP 142/80 | HR 64 | Temp 97.9°F | Resp 16 | Ht 73.0 in | Wt 205.0 lb

## 2013-01-07 DIAGNOSIS — C439 Malignant melanoma of skin, unspecified: Secondary | ICD-10-CM

## 2013-01-07 NOTE — Progress Notes (Signed)
Patient ID: Raymond Long, male   DOB: Sep 28, 1960, 52 y.o.   MRN: [id number]  Chief Complaint  Patient presents with  . Follow-up    Lt leg melanoma    HPI Raymond Long is a 52 y.o. male.  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  For about a year he noted a new left thigh mole that over past number of months began bleeding at times.  There was color change associated with this.  He has some left groin pain/discomfort from a prior lap bih.  He underwent biopsy of the left thigh lesion with pathology showing superficial spreading melanoma that is 0.9 mm thick, absent ulceration, lesions goes close to margin, mitotic index less than 1 per mm squared, no LVI, no regression.  He comes in today to discuss options.  Past Medical History  Diagnosis Date  . Hyperlipidemia   . Cancer     Skin  . Gout     Foot  . Erectile dysfunction   . Arthralgia of wrist, left   . Insomnia     Past Surgical History  Procedure Laterality Date  . Hernia repair  2008    Bilateral  . Tonsillectomy  age 8    History reviewed. No pertinent family history.  Social History History  Substance Use Topics  . Smoking status: Former Games developer  . Smokeless tobacco: Not on file     Comment: 9 years ago as of 2012   . Alcohol Use: No    Allergies  Allergen Reactions  . Atorvastatin   . Citalopram   . Ezetimibe-Simvastatin     REACTION: up cpk  . Fluoxetine Hcl   . Rosuvastatin     REACTION: up cpk  . Venlafaxine     Current Outpatient Prescriptions  Medication Sig Dispense Refill  . hydrOXYzine (ATARAX/VISTARIL) 25 MG tablet Take 25 mg by mouth at bedtime.      . Melatonin 3 MG CAPS Take by mouth at bedtime.        . rosuvastatin (CRESTOR) 20 MG tablet 1 by mouth daily  90 tablet  3  . sildenafil (VIAGRA) 100 MG tablet Take 1 tablet (100 mg total) by mouth daily as needed.  6 tablet  5   No  current facility-administered medications for this visit.    Review of Systems Review of Systems  Constitutional: Negative for fever, chills and unexpected weight change.  HENT: Negative for hearing loss, congestion, sore throat, trouble swallowing and voice change.   Eyes: Negative for visual disturbance.  Respiratory: Negative for cough and wheezing.   Cardiovascular: Negative for chest pain, palpitations and leg swelling.  Gastrointestinal: Negative for nausea, vomiting, abdominal pain, diarrhea, constipation, blood in stool, abdominal distention, anal bleeding and rectal pain.  Genitourinary: Negative for hematuria and difficulty urinating.  Musculoskeletal: Negative for arthralgias.  Skin: Negative for rash and wound.  Neurological: Negative for seizures, syncope, weakness and headaches.  Hematological: Negative for adenopathy. Does not bruise/bleed easily.  Psychiatric/Behavioral: Negative for confusion.    Blood pressure 142/80, pulse 64, temperature 97.9 F (36.6 C), temperature source Temporal, resp. rate 16, height 6\' 1"  (1.854 m), weight 205 lb (92.987 kg), SpO2 96.00%.  Physical Exam Physical Exam  Constitutional: He appears well-developed and well-nourished.  Neck: Neck supple.  Cardiovascular: Normal rate, regular rhythm and normal heart sounds.   Pulmonary/Chest: Effort normal and breath  sounds normal. He has no wheezes. He has no rales.  Musculoskeletal:       Legs: Lymphadenopathy:    He has no cervical adenopathy.       Right: No inguinal adenopathy present.       Left: No inguinal adenopathy present.    Data Reviewed Path reviewed  Assessment    T1c superficial spreading left thigh melanoma    Plan    WLE left thigh melanoma, left inguinal sentinel node biopsy    We discussed pathology results.  We will plan on WLE with 1 cm margins and sentinel node biopsy.  We discussed risks/benefits of procedure and time off afterwards.  Will plan on doing this  in next couple weeks.    Raymond Long 01/07/2013, 10:43 PM

## 2013-01-09 ENCOUNTER — Encounter (HOSPITAL_BASED_OUTPATIENT_CLINIC_OR_DEPARTMENT_OTHER): Payer: Self-pay | Admitting: *Deleted

## 2013-01-09 NOTE — Progress Notes (Signed)
Pt to come in for [facility] labs

## 2013-01-12 ENCOUNTER — Encounter (HOSPITAL_BASED_OUTPATIENT_CLINIC_OR_DEPARTMENT_OTHER): Payer: Self-pay | Admitting: Cardiology

## 2013-01-12 ENCOUNTER — Encounter (HOSPITAL_BASED_OUTPATIENT_CLINIC_OR_DEPARTMENT_OTHER)
Admission: RE | Admit: 2013-01-12 | Discharge: 2013-01-12 | Disposition: A | Discharge status: Home / Self Care | Payer: BC Managed Care – PPO | Source: Ambulatory Visit | Attending: General Surgery | Admitting: General Surgery

## 2013-01-12 LAB — CBC WITH DIFFERENTIAL/PLATELET
Basophils Relative: 0 % (ref 0–1)
Eosinophils Absolute: 0.1 10*3/uL (ref 0.0–0.7)
HCT: 42.9 % (ref 39.0–52.0)
Hemoglobin: 15.2 g/dL (ref 13.0–17.0)
Lymphs Abs: 1.7 10*3/uL (ref 0.7–4.0)
MCH: 32.1 pg (ref 26.0–34.0)
MCHC: 35.4 g/dL (ref 30.0–36.0)
Monocytes Absolute: 0.5 10*3/uL (ref 0.1–1.0)
Monocytes Relative: 9 % (ref 3–12)
Neutrophils Relative %: 59 % (ref 43–77)
RBC: 4.74 MIL/uL (ref 4.22–5.81)

## 2013-01-12 LAB — BASIC METABOLIC PANEL
BUN: 14 mg/dL (ref 6–23)
CO2: 26 mEq/L (ref 19–32)
Chloride: 102 mEq/L (ref 96–112)
GFR calc non Af Amer: 71 mL/min — ABNORMAL LOW (ref >90)
Glucose, Bld: 89 mg/dL (ref 70–99)
Potassium: 3.9 mEq/L (ref 3.5–5.1)
Sodium: 138 mEq/L (ref 135–145)

## 2013-01-15 ENCOUNTER — Encounter (HOSPITAL_BASED_OUTPATIENT_CLINIC_OR_DEPARTMENT_OTHER)
Admission: RE | Disposition: A | Discharge status: Home / Self Care | Payer: Self-pay | Source: Ambulatory Visit | Attending: General Surgery

## 2013-01-15 ENCOUNTER — Ambulatory Visit (HOSPITAL_BASED_OUTPATIENT_CLINIC_OR_DEPARTMENT_OTHER): Payer: BC Managed Care – PPO | Admitting: Anesthesiology

## 2013-01-15 ENCOUNTER — Encounter (HOSPITAL_BASED_OUTPATIENT_CLINIC_OR_DEPARTMENT_OTHER): Payer: Self-pay | Admitting: *Deleted

## 2013-01-15 ENCOUNTER — Ambulatory Visit (HOSPITAL_BASED_OUTPATIENT_CLINIC_OR_DEPARTMENT_OTHER)
Admission: RE | Admit: 2013-01-15 | Discharge: 2013-01-15 | Disposition: A | Discharge status: Home / Self Care | Payer: BC Managed Care – PPO | Source: Ambulatory Visit | Attending: General Surgery | Admitting: General Surgery

## 2013-01-15 ENCOUNTER — Ambulatory Visit (HOSPITAL_COMMUNITY)
Admission: RE | Admit: 2013-01-15 | Discharge: 2013-01-15 | Disposition: A | Discharge status: Home / Self Care | Payer: BC Managed Care – PPO | Source: Ambulatory Visit | Attending: General Surgery | Admitting: General Surgery

## 2013-01-15 ENCOUNTER — Encounter (HOSPITAL_BASED_OUTPATIENT_CLINIC_OR_DEPARTMENT_OTHER): Payer: Self-pay | Admitting: Anesthesiology

## 2013-01-15 DIAGNOSIS — C4370 Malignant melanoma of unspecified lower limb, including hip: Secondary | ICD-10-CM | POA: Insufficient documentation

## 2013-01-15 DIAGNOSIS — E785 Hyperlipidemia, unspecified: Secondary | ICD-10-CM | POA: Insufficient documentation

## 2013-01-15 DIAGNOSIS — C439 Malignant melanoma of skin, unspecified: Secondary | ICD-10-CM

## 2013-01-15 HISTORY — DX: Other specified postprocedural states: Z98.890

## 2013-01-15 HISTORY — PX: MELANOMA EXCISION WITH SENTINEL LYMPH NODE BIOPSY: SHX5267

## 2013-01-15 HISTORY — DX: Other specified postprocedural states: R11.2

## 2013-01-15 SURGERY — MELANOMA EXCISION WITH SENTINEL LYMPH NODE BIOPSY
Anesthesia: General | Site: Leg Upper | Laterality: Left | Wound class: Clean

## 2013-01-15 MED ORDER — OXYCODONE-ACETAMINOPHEN 10-325 MG PO TABS: 1.0000 | ORAL_TABLET | Freq: Four times a day (QID) | ORAL | Status: DC | PRN

## 2013-01-15 MED ORDER — HYDROMORPHONE HCL PF 1 MG/ML IJ SOLN
0.2500 mg | INTRAMUSCULAR | Status: DC | PRN
  Administered 2013-01-15 (×2): 0.5 mg via INTRAVENOUS

## 2013-01-15 MED ORDER — SCOPOLAMINE 1 MG/3DAYS TD PT72
1.0000 | MEDICATED_PATCH | TRANSDERMAL | Status: DC
  Administered 2013-01-15: 1.5 mg via TRANSDERMAL

## 2013-01-15 MED ORDER — LIDOCAINE HCL (CARDIAC) 20 MG/ML IV SOLN
INTRAVENOUS | Status: DC | PRN
  Administered 2013-01-15: 60 mg via INTRAVENOUS

## 2013-01-15 MED ORDER — SODIUM CHLORIDE 0.9 % IJ SOLN
INTRAMUSCULAR | Status: DC | PRN
  Administered 2013-01-15: 16:00:00

## 2013-01-15 MED ORDER — OXYCODONE HCL 5 MG PO TABS
5.0000 mg | ORAL_TABLET | Freq: Once | ORAL | Status: AC | PRN
  Administered 2013-01-15: 5 mg via ORAL

## 2013-01-15 MED ORDER — DEXAMETHASONE SODIUM PHOSPHATE 4 MG/ML IJ SOLN
INTRAMUSCULAR | Status: DC | PRN
  Administered 2013-01-15: 10 mg via INTRAVENOUS

## 2013-01-15 MED ORDER — SCOPOLAMINE 1 MG/3DAYS TD PT72: 1.0000 | MEDICATED_PATCH | TRANSDERMAL | Status: DC

## 2013-01-15 MED ORDER — PROPOFOL 10 MG/ML IV BOLUS
INTRAVENOUS | Status: DC | PRN
  Administered 2013-01-15: 200 mg via INTRAVENOUS

## 2013-01-15 MED ORDER — MIDAZOLAM HCL 2 MG/2ML IJ SOLN
1.0000 mg | INTRAMUSCULAR | Status: DC | PRN
  Administered 2013-01-15: 2 mg via INTRAVENOUS

## 2013-01-15 MED ORDER — CEFAZOLIN SODIUM-DEXTROSE 2-3 GM-% IV SOLR
2.0000 g | INTRAVENOUS | Status: AC
  Administered 2013-01-15: 2 g via INTRAVENOUS

## 2013-01-15 MED ORDER — TECHNETIUM TC 99M SULFUR COLLOID FILTERED
0.5000 | Freq: Once | INTRAVENOUS | Status: AC | PRN
  Administered 2013-01-15: 0.5 via INTRADERMAL

## 2013-01-15 MED ORDER — FENTANYL CITRATE 0.05 MG/ML IJ SOLN
50.0000 ug | INTRAMUSCULAR | Status: DC | PRN
  Administered 2013-01-15: 100 ug via INTRAVENOUS

## 2013-01-15 MED ORDER — FENTANYL CITRATE 0.05 MG/ML IJ SOLN
INTRAMUSCULAR | Status: DC | PRN
  Administered 2013-01-15 (×2): 50 ug via INTRAVENOUS

## 2013-01-15 MED ORDER — PROMETHAZINE HCL 25 MG/ML IJ SOLN: 6.2500 mg | Freq: Once | INTRAMUSCULAR | Status: DC

## 2013-01-15 MED ORDER — LACTATED RINGERS IV SOLN
INTRAVENOUS | Status: DC
  Administered 2013-01-15 (×2): via INTRAVENOUS

## 2013-01-15 MED ORDER — ONDANSETRON HCL 4 MG/2ML IJ SOLN
INTRAMUSCULAR | Status: DC | PRN
  Administered 2013-01-15: 4 mg via INTRAVENOUS

## 2013-01-15 MED ORDER — OXYCODONE HCL 5 MG/5ML PO SOLN: 5.0000 mg | Freq: Once | ORAL | Status: AC | PRN

## 2013-01-15 MED ORDER — BUPIVACAINE HCL (PF) 0.25 % IJ SOLN
INTRAMUSCULAR | Status: DC | PRN
  Administered 2013-01-15: 10 mL

## 2013-01-15 MED ORDER — SUCCINYLCHOLINE CHLORIDE 20 MG/ML IJ SOLN
INTRAMUSCULAR | Status: DC | PRN
  Administered 2013-01-15: 100 mg via INTRAVENOUS

## 2013-01-15 MED ORDER — BACITRACIN ZINC 500 UNIT/GM EX OINT
TOPICAL_OINTMENT | CUTANEOUS | Status: DC | PRN
  Administered 2013-01-15: 1 via TOPICAL

## 2013-01-15 MED ORDER — GLYCOPYRROLATE 0.2 MG/ML IJ SOLN
INTRAMUSCULAR | Status: DC | PRN
  Administered 2013-01-15: 0.2 mg via INTRAVENOUS

## 2013-01-15 SURGICAL SUPPLY — 64 items
ADH SKN CLS APL DERMABOND .7 (GAUZE/BANDAGES/DRESSINGS)
APL SKNCLS STERI-STRIP NONHPOA (GAUZE/BANDAGES/DRESSINGS) ×1
APPLIER CLIP 9.375 MED OPEN (MISCELLANEOUS)
APR CLP MED 9.3 20 MLT OPN (MISCELLANEOUS)
BANDAGE ELASTIC 6 VELCRO ST LF (GAUZE/BANDAGES/DRESSINGS) ×1 IMPLANT
BANDAGE GAUZE ELAST BULKY 4 IN (GAUZE/BANDAGES/DRESSINGS) ×1 IMPLANT
BENZOIN TINCTURE PRP APPL 2/3 (GAUZE/BANDAGES/DRESSINGS) ×1 IMPLANT
BLADE HEX COATED 2.75 (ELECTRODE) ×2 IMPLANT
BLADE SURG 15 STRL LF DISP TIS (BLADE) ×1 IMPLANT
BLADE SURG 15 STRL SS (BLADE) ×2
BNDG COHESIVE 4X5 TAN STRL (GAUZE/BANDAGES/DRESSINGS) IMPLANT
CANISTER SUCTION 1200CC (MISCELLANEOUS) IMPLANT
CHLORAPREP W/TINT 26ML (MISCELLANEOUS) ×2 IMPLANT
CLIP APPLIE 9.375 MED OPEN (MISCELLANEOUS) IMPLANT
CLOTH BEACON ORANGE TIMEOUT ST (SAFETY) ×2 IMPLANT
COVER MAYO STAND STRL (DRAPES) ×2 IMPLANT
COVER PROBE W GEL 5X96 (DRAPES) ×2 IMPLANT
COVER TABLE BACK 60X90 (DRAPES) ×2 IMPLANT
DECANTER SPIKE VIAL GLASS SM (MISCELLANEOUS) IMPLANT
DERMABOND ADVANCED (GAUZE/BANDAGES/DRESSINGS)
DERMABOND ADVANCED .7 DNX12 (GAUZE/BANDAGES/DRESSINGS) IMPLANT
DRAPE LAPAROSCOPIC ABDOMINAL (DRAPES) ×1 IMPLANT
DRAPE U-SHAPE 76X120 STRL (DRAPES) IMPLANT
DRSG TEGADERM 2-3/8X2-3/4 SM (GAUZE/BANDAGES/DRESSINGS) ×1 IMPLANT
DRSG TEGADERM 4X4.75 (GAUZE/BANDAGES/DRESSINGS) ×1 IMPLANT
ELECT REM PT RETURN 9FT ADLT (ELECTROSURGICAL) ×2
ELECTRODE REM PT RTRN 9FT ADLT (ELECTROSURGICAL) ×1 IMPLANT
GLOVE BIO SURGEON STRL SZ 6.5 (GLOVE) ×1 IMPLANT
GLOVE BIO SURGEON STRL SZ7 (GLOVE) ×2 IMPLANT
GLOVE BIOGEL PI IND STRL 7.0 (GLOVE) IMPLANT
GLOVE BIOGEL PI IND STRL 7.5 (GLOVE) ×1 IMPLANT
GLOVE BIOGEL PI INDICATOR 7.0 (GLOVE) ×1
GLOVE BIOGEL PI INDICATOR 7.5 (GLOVE) ×1
GOWN PREVENTION PLUS XLARGE (GOWN DISPOSABLE) ×3 IMPLANT
NDL HYPO 25X1 1.5 SAFETY (NEEDLE) ×2 IMPLANT
NDL SAFETY ECLIPSE 18X1.5 (NEEDLE) ×1 IMPLANT
NEEDLE HYPO 18GX1.5 SHARP (NEEDLE) ×2
NEEDLE HYPO 25X1 1.5 SAFETY (NEEDLE) ×4 IMPLANT
NS IRRIG 1000ML POUR BTL (IV SOLUTION) IMPLANT
PACK BASIN DAY SURGERY FS (CUSTOM PROCEDURE TRAY) ×2 IMPLANT
PENCIL BUTTON HOLSTER BLD 10FT (ELECTRODE) ×2 IMPLANT
SHEET MEDIUM DRAPE 40X70 STRL (DRAPES) IMPLANT
SLEEVE SCD COMPRESS KNEE MED (MISCELLANEOUS) ×2 IMPLANT
SPONGE GAUZE 4X4 12PLY (GAUZE/BANDAGES/DRESSINGS) ×1 IMPLANT
SPONGE LAP 4X18 X RAY DECT (DISPOSABLE) ×2 IMPLANT
STAPLER VISISTAT 35W (STAPLE) ×1 IMPLANT
STOCKINETTE IMPERVIOUS LG (DRAPES) IMPLANT
STRIP CLOSURE SKIN 1/2X4 (GAUZE/BANDAGES/DRESSINGS) ×1 IMPLANT
SUT ETHILON 3 0 PS 1 (SUTURE) ×6 IMPLANT
SUT MNCRL AB 4-0 PS2 18 (SUTURE) ×4 IMPLANT
SUT SILK 2 0 FS (SUTURE) ×1 IMPLANT
SUT VIC AB 2-0 SH 27 (SUTURE) ×2
SUT VIC AB 2-0 SH 27XBRD (SUTURE) ×1 IMPLANT
SUT VIC AB 3-0 SH 27 (SUTURE)
SUT VIC AB 3-0 SH 27X BRD (SUTURE) IMPLANT
SUT VICRYL 3-0 CR8 SH (SUTURE) ×1 IMPLANT
SUT VICRYL 4-0 PS2 18IN ABS (SUTURE) ×2 IMPLANT
SUT VICRYL AB 3 0 TIES (SUTURE) IMPLANT
SYR CONTROL 10ML LL (SYRINGE) ×3 IMPLANT
SYR TB 1ML 26GX3/8 SAFETY (SYRINGE) ×1 IMPLANT
TOWEL OR 17X24 6PK STRL BLUE (TOWEL DISPOSABLE) ×2 IMPLANT
TOWEL OR NON WOVEN STRL DISP B (DISPOSABLE) ×2 IMPLANT
TUBE CONNECTING 20X1/4 (TUBING) IMPLANT
YANKAUER SUCT BULB TIP NO VENT (SUCTIONS) IMPLANT

## 2013-01-15 NOTE — Transfer of Care (Signed)
Immediate Anesthesia Transfer of Care Note  Patient: Raymond Long  Procedure(s) Performed: Procedure(s): EXCISION LEFT LEG MELANOMA WITH LEFT INGUINAL SENTINEL NODE BIOPSY (Left)  Patient Location: PACU  Anesthesia Type:General  Level of Consciousness: alert , sedated and patient cooperative  Airway & Oxygen Therapy: Patient Spontanous Breathing and Patient connected to face mask oxygen  Post-op Assessment: Report given to PACU RN and Post -op Vital signs reviewed and stable  Post vital signs: Reviewed and stable  Complications: No apparent anesthesia complications

## 2013-01-15 NOTE — Op Note (Signed)
Preoperative diagnosis: Clinical stage I left thigh melanoma Postoperative diagnosis: Same as above Procedure: #1 wide local excision of T1c left thigh melanoma #2 left inguinal sentinel lymph node biopsy #3 injection of blue dye for sentinel node identification Surgeon: Dr. Harden Mo Anesthesia: Gen. Specimens: #1 left thigh melanoma marked short stitch superior, long stitch lateral, double stitch deep #2 left inguinal sentinel lymph node Complications: None Drains: None Disposition to recovery in stable condition Sponge and count was correct at completion  Indications: This is a 52 year old male who had a shave biopsy of a left thigh melanoma that was a T1c melanoma. There were no real adverse features associated with it. We discussed all of his options and elected to proceed with a wide local excision and a left inguinal sentinel lymph node biopsy.  Procedure: After informed consent was obtained the patient was taken to the operating room. He was given 2 g of intravenous cefazolin. Sequential compression devices were on his legs. He had been administered technetium in a standard periareolar fashion. He was placed under general anesthesia. His left thigh and groin were prepped and draped in the standard sterile surgical fashion.  I marked out a centimeter in each direction from the melanoma on his thigh. I injected a mixture of blue dye and saline around the melanoma for sentinel node identification and massages for 2 minutes. I then proceeded to make a 4 x 12 cm elliptical incision surrounding this melanoma to give me a 1 cm margin from the prior biopsy. This was removed as an entire specimen. I then marked this as above. I then obtained hemostasis. I mobilized the skin in each direction. I then closed this with multiple 3-0 interrupted Vicryls. Then closed with external 3-0 nylon sutures.  I then used the neoprobe to identify the location of the sentinel lymph node. I made a vertical  incision overlying this. There was blue dye present as well as a count of 75. I removed a single sentinel node and passes off the table as a specimen. There is no background radioactivity. I then closed this incision with 3-0 Vicryl, 4 Monocryl. I then placed Steri-Strips on this incision. I infiltrated Marcaine throughout this and place a sterile dressing. On the other incision I placed bacitracin and a sterile dressing and wrapped his leg. He tolerated this well was extubated and transferred to recovery stable.

## 2013-01-15 NOTE — H&P (View-Only) (Signed)
Patient ID: Raymond Long, male   DOB: 06/02/1960, 52 y.o.   MRN: [id number]  Chief Complaint  Patient presents with  . Follow-up    Lt leg melanoma    HPI Raymond Long is a 52 y.o. male.  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  For about a year he noted a new left thigh mole that over past number of months began bleeding at times.  There was color change associated with this.  He has some left groin pain/discomfort from a prior lap bih.  He underwent biopsy of the left thigh lesion with pathology showing superficial spreading melanoma that is 0.9 mm thick, absent ulceration, lesions goes close to margin, mitotic index less than 1 per mm squared, no LVI, no regression.  He comes in today to discuss options.  Past Medical History  Diagnosis Date  . Hyperlipidemia   . Cancer     Skin  . Gout     Foot  . Erectile dysfunction   . Arthralgia of wrist, left   . Insomnia     Past Surgical History  Procedure Laterality Date  . Hernia repair  2008    Bilateral  . Tonsillectomy  age 11    History reviewed. No pertinent family history.  Social History History  Substance Use Topics  . Smoking status: Former Smoker  . Smokeless tobacco: Not on file     Comment: 9 years ago as of 2012   . Alcohol Use: No    Allergies  Allergen Reactions  . Atorvastatin   . Citalopram   . Ezetimibe-Simvastatin     REACTION: up cpk  . Fluoxetine Hcl   . Rosuvastatin     REACTION: up cpk  . Venlafaxine     Current Outpatient Prescriptions  Medication Sig Dispense Refill  . hydrOXYzine (ATARAX/VISTARIL) 25 MG tablet Take 25 mg by mouth at bedtime.      . Melatonin 3 MG CAPS Take by mouth at bedtime.        . rosuvastatin (CRESTOR) 20 MG tablet 1 by mouth daily  90 tablet  3  . sildenafil (VIAGRA) 100 MG tablet Take 1 tablet (100 mg total) by mouth daily as needed.  6 tablet  5   No  current facility-administered medications for this visit.    Review of Systems Review of Systems  Constitutional: Negative for fever, chills and unexpected weight change.  HENT: Negative for hearing loss, congestion, sore throat, trouble swallowing and voice change.   Eyes: Negative for visual disturbance.  Respiratory: Negative for cough and wheezing.   Cardiovascular: Negative for chest pain, palpitations and leg swelling.  Gastrointestinal: Negative for nausea, vomiting, abdominal pain, diarrhea, constipation, blood in stool, abdominal distention, anal bleeding and rectal pain.  Genitourinary: Negative for hematuria and difficulty urinating.  Musculoskeletal: Negative for arthralgias.  Skin: Negative for rash and wound.  Neurological: Negative for seizures, syncope, weakness and headaches.  Hematological: Negative for adenopathy. Does not bruise/bleed easily.  Psychiatric/Behavioral: Negative for confusion.    Blood pressure 142/80, pulse 64, temperature 97.9 F (36.6 C), temperature source Temporal, resp. rate 16, height 6' 1" (1.854 m), weight 205 lb (92.987 kg), SpO2 96.00%.  Physical Exam Physical Exam  Constitutional: He appears well-developed and well-nourished.  Neck: Neck supple.  Cardiovascular: Normal rate, regular rhythm and normal heart sounds.   Pulmonary/Chest: Effort normal and breath   sounds normal. He has no wheezes. He has no rales.  Musculoskeletal:       Legs: Lymphadenopathy:    He has no cervical adenopathy.       Right: No inguinal adenopathy present.       Left: No inguinal adenopathy present.    Data Reviewed Path reviewed  Assessment    T1c superficial spreading left thigh melanoma    Plan    WLE left thigh melanoma, left inguinal sentinel node biopsy    We discussed pathology results.  We will plan on WLE with 1 cm margins and sentinel node biopsy.  We discussed risks/benefits of procedure and time off afterwards.  Will plan on doing this  in next couple weeks.    Raymond Long 01/07/2013, 10:43 PM    

## 2013-01-15 NOTE — Progress Notes (Signed)
Nuc med injection performed by radiology staff. Versed and fentanyl given for sedation. Pt tol well. VSS, see documentation in flowsheet. Jolyn Lent, called to bedside and updated him on status. Emotional support provided to pt and friend.

## 2013-01-15 NOTE — Anesthesia Postprocedure Evaluation (Signed)
  Anesthesia Post-op Note  Patient: Raymond Long  Procedure(s) Performed: Procedure(s): EXCISION LEFT LEG MELANOMA WITH LEFT INGUINAL SENTINEL NODE BIOPSY (Left)  Patient Location: PACU  Anesthesia Type:General  Level of Consciousness: awake, alert  and oriented  Airway and Oxygen Therapy: Patient Spontanous Breathing  Post-op Pain: mild  Post-op Assessment: Post-op Vital signs reviewed, Patient's Cardiovascular Status Stable, Respiratory Function Stable and Patent Airway  Post-op Vital Signs: Reviewed and stable  Complications: No apparent anesthesia complications

## 2013-01-15 NOTE — Anesthesia Preprocedure Evaluation (Addendum)
Anesthesia Evaluation  Patient identified by MRN, date of birth, ID band Patient awake    Reviewed: Allergy & Precautions, H&P , NPO status , Patient's Chart, lab work & pertinent test results  History of Anesthesia Complications (+) PONV  Airway Mallampati: II TM Distance: >3 FB Neck ROM: Full    Dental no notable dental hx. (+) Teeth Intact and Dental Advisory Given   Pulmonary neg pulmonary ROS,  breath sounds clear to auscultation  Pulmonary exam normal       Cardiovascular negative cardio ROS  Rhythm:Regular Rate:Normal     Neuro/Psych negative neurological ROS  negative psych ROS   GI/Hepatic negative GI ROS, Neg liver ROS,   Endo/Other  negative endocrine ROS  Renal/GU negative Renal ROS  negative genitourinary   Musculoskeletal   Abdominal   Peds  Hematology negative hematology ROS (+)   Anesthesia Other Findings   Reproductive/Obstetrics negative OB ROS                          Anesthesia Physical Anesthesia Plan  ASA: II  Anesthesia Plan: General   Post-op Pain Management:    Induction: Intravenous  Airway Management Planned: LMA and Oral ETT  Additional Equipment:   Intra-op Plan:   Post-operative Plan: Extubation in OR  Informed Consent: I have reviewed the patients History and Physical, chart, labs and discussed the procedure including the risks, benefits and alternatives for the proposed anesthesia with the patient or authorized representative who has indicated his/her understanding and acceptance.   Dental advisory given  Plan Discussed with: CRNA  Anesthesia Plan Comments:        Anesthesia Quick Evaluation

## 2013-01-15 NOTE — Interval H&P Note (Signed)
History and Physical Interval Note:  01/15/2013 3:38 PM  Raymond Long  has presented today for surgery, with the diagnosis of remove left leg melanoma with margin, biopsy left groin node(s)  The various methods of treatment have been discussed with the patient and family. After consideration of risks, benefits and other options for treatment, the patient has consented to  Procedure(s): EXCISION LEFT LEG MELANOMA WITH LEFT INGUINAL SENTINEL NODE BIOPSY (Left) as a surgical intervention .  The patient's history has been reviewed, patient examined, no change in status, stable for surgery.  I have reviewed the patient's chart and labs.  Questions were answered to the patient's satisfaction.      [name]

## 2013-01-16 ENCOUNTER — Encounter (HOSPITAL_BASED_OUTPATIENT_CLINIC_OR_DEPARTMENT_OTHER): Payer: Self-pay | Admitting: General Surgery

## 2013-01-16 LAB — POCT HEMOGLOBIN-HEMACUE: Hemoglobin: 16 g/dL (ref 13.0–17.0)

## 2013-01-21 ENCOUNTER — Telehealth (INDEPENDENT_AMBULATORY_CARE_PROVIDER_SITE_OTHER): Payer: Self-pay

## 2013-01-21 NOTE — Telephone Encounter (Signed)
Patient calling Dr Dwain Sarna back regarding pathology. States his phone gets bad reception sometimes. Please try and reach him again at [phone].

## 2013-01-22 ENCOUNTER — Encounter (INDEPENDENT_AMBULATORY_CARE_PROVIDER_SITE_OTHER): Payer: BC Managed Care – PPO | Admitting: General Surgery

## 2013-02-02 ENCOUNTER — Ambulatory Visit (INDEPENDENT_AMBULATORY_CARE_PROVIDER_SITE_OTHER): Payer: BC Managed Care – PPO | Admitting: General Surgery

## 2013-02-02 ENCOUNTER — Encounter (INDEPENDENT_AMBULATORY_CARE_PROVIDER_SITE_OTHER): Payer: Self-pay | Admitting: General Surgery

## 2013-02-02 VITALS — BP 140/84 | HR 79 | Temp 96.6°F | Resp 16 | Ht 73.0 in | Wt 201.8 lb

## 2013-02-02 DIAGNOSIS — Z09 Encounter for follow-up examination after completed treatment for conditions other than malignant neoplasm: Secondary | ICD-10-CM

## 2013-02-02 NOTE — Progress Notes (Signed)
Subjective:     Patient ID: Raymond Long, male   DOB: Jan 12, 1961, 52 y.o.   MRN: [id number]  HPI This is a 52 year old male who underwent a wide local excision of a left thigh melanoma. His pathology returned as a T1 N0 melanoma. We discussed his pathology today. He is doing well and has no complaints since surgery.  Review of Systems     Objective:   Physical Exam Left thigh incision was stitches in place without infection and this is healing well.  Left inguinal incision is without infection, this as a very small fluid collection associated with it    Assessment:     Status post stage I melanoma excision     Plan:     I removed his stitches today and applied Steri-Strips. He's going to increase his activity in 2 weeks. I will see him back in 3 weeks just to make sure he continues to heal well. He is going to followup with his dermatologist also.

## 2013-02-23 ENCOUNTER — Ambulatory Visit (INDEPENDENT_AMBULATORY_CARE_PROVIDER_SITE_OTHER): Payer: BC Managed Care – PPO | Admitting: General Surgery

## 2013-02-23 ENCOUNTER — Encounter (INDEPENDENT_AMBULATORY_CARE_PROVIDER_SITE_OTHER): Payer: Self-pay | Admitting: General Surgery

## 2013-02-23 VITALS — BP 138/90 | HR 76 | Temp 98.2°F | Resp 15 | Ht 73.5 in | Wt 200.8 lb

## 2013-02-23 DIAGNOSIS — Z09 Encounter for follow-up examination after completed treatment for conditions other than malignant neoplasm: Secondary | ICD-10-CM

## 2013-02-23 NOTE — Progress Notes (Signed)
Subjective:     Patient ID: Raymond Long, male   DOB: 1960/07/03, 52 y.o.   MRN: [id number]  HPI This is a 52 year old male who excised a stage I left leg melanoma. I took his sutures out the last time he was here. Also did a sentinel node that was negative. He has done well since I removed his sutures. He has some numbness and tingling around both of these incisions on his thigh. This is slowly getting better. He returns today to see if he can be released to full activity.  Review of Systems     Objective:   Physical Exam Healed left thigh incision status post excision of melanoma Healed left groin incision status post sentinel node biopsy Both of these infections are clean without any evidence of infection    Assessment:     Status post thigh melanoma excision     Plan:     I released him to full activity today. He is going to follow up with his dermatologist for skin checks. I will plan on seeing him back as needed. I told him that the symptoms that he is having around the incision should get better over the next several months and these were normal.

## 2013-04-06 ENCOUNTER — Encounter: Payer: Self-pay | Admitting: Internal Medicine

## 2013-04-06 ENCOUNTER — Ambulatory Visit (INDEPENDENT_AMBULATORY_CARE_PROVIDER_SITE_OTHER): Payer: BC Managed Care – PPO | Admitting: Internal Medicine

## 2013-04-06 VITALS — BP 171/89 | HR 77 | Temp 98.5°F | Resp 13 | Ht 71.25 in | Wt 203.6 lb

## 2013-04-06 DIAGNOSIS — J209 Acute bronchitis, unspecified: Secondary | ICD-10-CM

## 2013-04-06 DIAGNOSIS — R042 Hemoptysis: Secondary | ICD-10-CM

## 2013-04-06 DIAGNOSIS — C4370 Malignant melanoma of unspecified lower limb, including hip: Secondary | ICD-10-CM

## 2013-04-06 DIAGNOSIS — R030 Elevated blood-pressure reading, without diagnosis of hypertension: Secondary | ICD-10-CM

## 2013-04-06 DIAGNOSIS — C4372 Malignant melanoma of left lower limb, including hip: Secondary | ICD-10-CM

## 2013-04-06 MED ORDER — AMOXICILLIN-POT CLAVULANATE 875-125 MG PO TABS: 1.0000 | ORAL_TABLET | Freq: Two times a day (BID) | ORAL | Status: DC

## 2013-04-06 MED ORDER — HYDROCODONE-HOMATROPINE 5-1.5 MG/5ML PO SYRP: 5.0000 mL | ORAL_SOLUTION | Freq: Four times a day (QID) | ORAL | Status: DC | PRN

## 2013-04-06 NOTE — Progress Notes (Signed)
Pre visit review using our clinic review tool, if applicable. No additional management support is needed unless otherwise documented below in the visit note. 

## 2013-04-06 NOTE — Progress Notes (Signed)
   Subjective:    Patient ID: Raymond Long, male    DOB: [birth year], 52 y.o.   MRN: [id number]  HPI Symptoms began 03/29/13 as sore throat followed by headache and ear ache.  By 11/26 he had laryngitis with voice loss. On 11/27 cough was productive of sputum. Initially was clear but subsequently turned yellow. Finally he began to note some streaks of blood in the sputum as of 11/30.  He had low-grade fever up to 100 in the first few days associated with chills and sweats. He describes diffuse myalgias and arthralgias and was bedridden 11/27-11/30.  He is also had associated shortness of breath and wheezing.  He describes otic pain as well as dental pain. He has frontal and maxillary sinus area pain. The nasal secretions have been clear.  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 He has no history of asthma. He has not had the flu shot this year. Melanoma surgery LLE 9/14  Review of Systems There has been no otic discharge. He has no significant extrinsic symptoms of itchy, watery eyes. He has had some sneezing.   He has not had pleuritic chest pain, calf pain, or pedal edema.    Objective:   Physical Exam General appearance:good health ;well nourished; no acute distress or increased work of breathing is present.  No  lymphadenopathy about the head, neck, or axilla noted.   Eyes: No conjunctival inflammation or lid edema is present. There is no scleral icterus.  Ears:  External ear exam shows no significant lesions or deformities.  Otoscopic examination reveals wax on L . R TM normal  Nose:  External nasal examination shows no deformity or inflammation. Nasal mucosa are dry without lesions or exudates. Septal perforation.   Oral exam: Dental hygiene is good; lips and gums are healthy appearing.There is no oropharyngeal erythema or exudate noted. Very hoarse  Neck:  No deformities,  masses, or tenderness noted.     Heart:  Normal rate and regular rhythm. S1 and S2  normal without gallop, murmur, click, rub or other extra sounds. Recheck BP 136/96  Lungs:Chest clear to auscultation; no wheezes, rhonchi,rales ,or rubs present.No increased work of breathing.    Abdomen: bowel sounds normal, soft and non-tender without masses, organomegaly or hernias noted.  No guarding or rebound  Extremities:  No cyanosis, edema, or clubbing  noted    Skin: Warm & dry w/o tenting.         Assessment & Plan:  #1 bronchitis, acute  #2 hemoptysis  #3 upper respiratory tract infection  #4 history of melanoma resection left lower extremity 9/14  #5 HTN on OTC meds   Plan: See orders

## 2013-04-06 NOTE — Patient Instructions (Signed)
Plain Mucinex (NOT D) for thick secretions ;force NON dairy fluids .   Nasal cleansing in the shower as discussed with lather of mild shampoo.After 10 seconds wash off lather while  exhaling through nostrils. Make sure that all residual soap is removed to prevent irritation.  Nasacort AQ OTC  1 spray in each nostril twice a day as needed. Use the "crossover" technique into opposite nostril spraying toward opposite ear @ 45 degree angle, not straight up into nostril.  Use a Neti pot daily only  as needed for significant sinus congestion; going from open side to congested side . Plain Allegra (NOT D )  160 daily , Loratidine 10 mg , OR Zyrtec 10 mg @ bedtime  as needed for itchy eyes & sneezing.   Minimal Blood Pressure Goal= AVERAGE < 140/90;  Ideal is an AVERAGE < 135/85. This can also be checked against the blood pressure device at the pharmacy. Finger or wrist cuffs are not dependable; an arm cuff is. Order for x-rays entered into  the computer; these will be performed at [address] [facility]. across from [facility]. No appointment is necessary. Your next office appointment will be determined based upon review of your pending labs & x-rays. Those instructions will be transmitted to you through My Chart   Please report any significant change in your symptoms.

## 2013-04-07 ENCOUNTER — Ambulatory Visit (INDEPENDENT_AMBULATORY_CARE_PROVIDER_SITE_OTHER)
Admission: RE | Admit: 2013-04-07 | Discharge: 2013-04-07 | Disposition: A | Discharge status: Home / Self Care | Payer: BC Managed Care – PPO | Source: Ambulatory Visit | Attending: Internal Medicine | Admitting: Internal Medicine

## 2013-04-07 ENCOUNTER — Encounter: Payer: Self-pay | Admitting: *Deleted

## 2013-04-07 DIAGNOSIS — J209 Acute bronchitis, unspecified: Secondary | ICD-10-CM

## 2013-04-07 DIAGNOSIS — R042 Hemoptysis: Secondary | ICD-10-CM

## 2013-04-07 LAB — CBC WITH DIFFERENTIAL/PLATELET
Basophils Relative: 0.5 % (ref 0.0–3.0)
Eosinophils Absolute: 0.1 10*3/uL (ref 0.0–0.7)
Eosinophils Relative: 1.6 % (ref 0.0–5.0)
Lymphocytes Relative: 19.5 % (ref 12.0–46.0)
Neutrophils Relative %: 68 % (ref 43.0–77.0)
RBC: 4.32 Mil/uL (ref 4.22–5.81)
WBC: 7.1 10*3/uL (ref 4.5–10.5)

## 2013-04-07 NOTE — Addendum Note (Signed)
Addended by: Silvio Pate D on: 04/07/2013 08:55 AM   Modules accepted: Orders

## 2013-04-08 ENCOUNTER — Encounter: Payer: Self-pay | Admitting: *Deleted

## 2013-10-29 ENCOUNTER — Ambulatory Visit (INDEPENDENT_AMBULATORY_CARE_PROVIDER_SITE_OTHER): Payer: BC Managed Care – PPO | Admitting: Family Medicine

## 2013-10-29 VITALS — BP 150/88 | HR 89 | Temp 98.5°F | Resp 18 | Ht 71.0 in | Wt 195.2 lb

## 2013-10-29 DIAGNOSIS — R109 Unspecified abdominal pain: Secondary | ICD-10-CM

## 2013-10-29 DIAGNOSIS — R1030 Lower abdominal pain, unspecified: Secondary | ICD-10-CM

## 2013-10-29 DIAGNOSIS — R3911 Hesitancy of micturition: Secondary | ICD-10-CM

## 2013-10-29 DIAGNOSIS — K645 Perianal venous thrombosis: Secondary | ICD-10-CM

## 2013-10-29 DIAGNOSIS — K644 Residual hemorrhoidal skin tags: Secondary | ICD-10-CM

## 2013-10-29 DIAGNOSIS — K6289 Other specified diseases of anus and rectum: Secondary | ICD-10-CM

## 2013-10-29 LAB — POCT URINALYSIS DIPSTICK
Bilirubin, UA: NEGATIVE
GLUCOSE UA: NEGATIVE
Ketones, UA: NEGATIVE
LEUKOCYTES UA: NEGATIVE
NITRITE UA: NEGATIVE
PH UA: 5
Protein, UA: NEGATIVE
RBC UA: NEGATIVE
Spec Grav, UA: <=1.005
UROBILINOGEN UA: 0.2

## 2013-10-29 LAB — POCT UA - MICROSCOPIC ONLY
Bacteria, U Microscopic: NEGATIVE
CASTS, UR, LPF, POC: NEGATIVE
Crystals, Ur, HPF, POC: NEGATIVE
MUCUS UA: NEGATIVE
RBC, urine, microscopic: NEGATIVE
YEAST UA: NEGATIVE

## 2013-10-29 MED ORDER — HYDROCORTISONE 2.5 % RE CREA: 1.0000 "application " | TOPICAL_CREAM | Freq: Two times a day (BID) | RECTAL | Status: DC

## 2013-10-29 NOTE — Progress Notes (Addendum)
Subjective:    Patient ID: Raymond Long, male    DOB: 1961-01-25, 53 y.o.   MRN: [id number]   This chart was scribed for Ranell Patrick. Carlota Raspberry, MD by Steva Colder, ED Scribe. The patient was seen in room 10 at 12:03 PM.   Chief Complaint  Patient presents with  . Rectum pain    Pain between his rectum area and testicles, described more like a sharp pain, x4 days    HPI Raymond Long is a 53 y.o. male who presents today complaining of a sharp, rectal pain onset 4 days ago. He states that there is also pain in his testicles. He states that the rectum area currently feels sore.  He states that he had diarrhea a this past weekend, but he currently denies diarrhea.  He states that this morning when he got up it was really sore and painful to get out of bed.   He states that today he used Preparation-H and it aided slightly in his pain and made the area a little less sore. He states that his last bowel movement was yesterday morning and there was no straining. Marland Kitchen He states that he urinates more as of lately and is "Starting and stopping"  with the stream onset a year. He states that with the current symptoms, there is not much change with his urinary issues.    He states that he had melanoma surgery on the left thigh December of last year. He states that he had a biopsy of that area in October of last year. He states that recently, he has had pain in this area onset 1 month ago. He describes the pain as dull sometimes and more intense other times. He states that the biopsy was done at [facility] by Dr. Donne Hazel.  He states that he has a Paediatric nurse that he sees for the melanoma, he last saw the Dermatologist about 5 months ago.   He states that he runs 15-20 miles a week. He states that when he runs, there is pain. He states that he had a prostate exam 2 years ago that was normal.  He denies dysuria, fever, nausea, vomiting, hematuria, hematochezia, blood with wiping, urinary  incontinence, testicle pain, penile discharge, or any new sexual partners. He states that 10 years ago he had hemorrhoids, he states that this current pain is different.    Patient Active Problem List   Diagnosis Date Noted  . 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 Skin cancer, basal cell 10/28/2012  . Fasting hyperglycemia 10/28/2012  . Other abnormal glucose 10/23/2012  . Unspecified adverse effect of unspecified drug, medicinal and biological substance 12/11/2011  . GOUT 10/11/2008  . ERECTILE DYSFUNCTION 06/15/2008  . ARTHRALGIA 06/15/2008  . HYPERLIPIDEMIA 04/23/2008  . CARPAL TUNNEL SYNDROME 04/23/2008  . SYMPTOM, DISTURBANCE, SLEEP NOS 11/20/2006  . INGUINAL PAIN, LEFT 11/20/2006   Past Medical History  Diagnosis Date  . Hyperlipidemia   . Cancer     Skin  . Gout     Foot  . Erectile dysfunction   . Arthralgia of wrist, left   . Insomnia   . PONV (postoperative nausea and vomiting)    Past Surgical History  Procedure Laterality Date  . Tonsillectomy  age 79  . Hernia repair  2008    Bilateral  . Colonoscopy    . Melanoma excision with sentinel lymph node biopsy Left 01/15/2013    Procedure: EXCISION LEFT LEG MELANOMA WITH LEFT  INGUINAL SENTINEL NODE BIOPSY;  Surgeon: Rolm Bookbinder, MD;  Location: [place];  Service: General;  Laterality: Left;   Allergies  Allergen Reactions  . Atorvastatin   . Citalopram   . Ezetimibe-Simvastatin     REACTION: up cpk  . Fluoxetine Hcl   . Rosuvastatin     REACTION: up cpk  . Venlafaxine    Prior to Admission medications   Medication Sig Start Date End Date Taking? Authorizing Provider  beta carotene 25000 UNIT capsule Take 25,000 Units by mouth daily.   Yes Historical Provider, MD  fluorouracil (EFUDEX) 5 % cream Apply 1 drop topically 2 (two) times daily.   Yes Historical Provider, MD  hydrOXYzine (ATARAX/VISTARIL) 25 MG tablet Take 25 mg by mouth at bedtime.   Yes Historical Provider,  MD  ibuprofen (ADVIL,MOTRIN) 800 MG tablet Take 800 mg by mouth every 8 (eight) hours as needed for pain.   Yes Historical Provider, MD  Melatonin 3 MG CAPS Take by mouth at bedtime.     Yes Historical Provider, MD  Multiple Vitamins-Minerals (MULTIVITAMIN WITH MINERALS) tablet Take 1 tablet by mouth daily.   Yes Historical Provider, MD  rosuvastatin (CRESTOR) 20 MG tablet 1 by mouth daily 10/28/12  Yes Hendricks Limes, MD  sildenafil (VIAGRA) 100 MG tablet Take 1 tablet (100 mg total) by mouth daily as needed. 07/24/12  Yes Hendricks Limes, MD  vitamin C (ASCORBIC ACID) 500 MG tablet Take 500 mg by mouth daily.   Yes Historical Provider, MD  vitamin E 400 UNIT capsule Take 400 Units by mouth daily.   Yes Historical Provider, MD    Review of Systems  Constitutional: Negative for fever.  Gastrointestinal: Negative for nausea, vomiting and diarrhea.  Genitourinary: Positive for urgency and difficulty urinating (Urinary hesitancy). Negative for dysuria, hematuria, discharge and testicular pain.       Objective:   Physical Exam  Nursing note and vitals reviewed. Constitutional: He is oriented to person, place, and time. He appears well-developed and well-nourished. No distress.  HENT:  Head: Normocephalic and atraumatic.  Eyes: EOM are normal.  Neck: Neck supple. No tracheal deviation present.  Cardiovascular: Normal rate.   Pulmonary/Chest: Effort normal. No respiratory distress.  Genitourinary: Rectal exam shows external hemorrhoid (about 6 o'clock, firm, ttp hemorrhoid - suspected component of thrombosis. no fissures.  prostate nt, no active bleeding. ). Rectal exam shows no fissure. Prostate is not tender. Right testis shows no swelling and no tenderness. Left testis shows no swelling and no tenderness. No discharge found.  No testicular or epididymal ttp.  No palpable swelling or apparent LAD in L inguinal crease. Skin intact.   Musculoskeletal: Normal range of motion.    Lymphadenopathy:       Right: No inguinal adenopathy present.       Left: No inguinal adenopathy present.  Neurological: He is alert and oriented to person, place, and time.  Skin: Skin is warm and dry.  Psychiatric: He has a normal mood and affect. His behavior is normal.        Filed Vitals:   10/29/13 1139  BP: 150/88  Pulse: 89  Temp: 98.5 F (36.9 C)  TempSrc: Oral  Resp: 18  Height: 5\' 11"  (1.803 m)  Weight: 195 lb 3.2 oz (88.542 kg)  SpO2: 99%   Results for orders placed in visit on 10/29/13  POCT UA - MICROSCOPIC ONLY      Result Value Ref Range   WBC, Ur, HPF, POC  0-1     RBC, urine, microscopic neg     Bacteria, U Microscopic neg     Mucus, UA neg     Epithelial cells, urine per micros 0-1     Crystals, Ur, HPF, POC neg     Casts, Ur, LPF, POC neg     Yeast, UA neg    POCT URINALYSIS DIPSTICK      Result Value Ref Range   Color, UA yellow     Clarity, UA clear     Glucose, UA neg     Bilirubin, UA neg     Ketones, UA neg     Spec Grav, UA <=1.005     Blood, UA neg     pH, UA 5.0     Protein, UA neg     Urobilinogen, UA 0.2     Nitrite, UA neg     Leukocytes, UA Negative        Assessment & Plan:   Raymond Long is a 53 y.o. male External hemorrhoid - Rectal pain - Plan: POCT UA - Microscopic Only, POCT urinalysis dipstick, PSA,  hydrocortisone (ANUSOL-HC) 2.5 % rectal cream  - suspect element of thrombosis, but declined opening this today. Will  Try sitz baths, sx care as below, and RTC precautions.   Urinary hesitancy - Plan: POCT UA - Microscopic Only, POCT urinalysis dipstick, PSA  psa pending, but reassuring U/a and exam. rtc precautions.   Groin pain, unspecified laterality - Plan: POCT UA - Microscopic Only, POCT urinalysis dipstick, PSA  - no apparent LAD/swelling in this area, but if he feels this area is abnormal over next month - follow up with surgeon as prior melanoma, but by report no lymph node involvement.   Meds ordered this  encounter  Medications  . hydrocortisone (ANUSOL-HC) 2.5 % rectal cream    Sig: Place 1 application rectally 2 (two) times daily.    Dispense:  30 g    Refill:  0   Patient Instructions  See info below for hemorrhoids.  If any worsening of pain - return for possible opening of clot inside hemorrhoid. Warm soaks, and anusol HC as needed.  You should receive a call or letter about your lab results (prostate test) within the next week to 10 days, but return to the clinic or go to the nearest emergency room if any of your symptoms worsen or new symptoms occur.  I did not feel any swollen lymph nodes in the groin today, but if this area still feels full in next few weeks - call your surgeon to have this area checked.   Hemorrhoids Hemorrhoids are swollen veins around the rectum or anus. There are two types of hemorrhoids:   Internal hemorrhoids. These occur in the veins just inside the rectum. They may poke through to the outside and become irritated and painful.  External hemorrhoids. These occur in the veins outside the anus and can be felt as a painful swelling or hard lump near the anus. CAUSES  Pregnancy.   Obesity.   Constipation or diarrhea.   Straining to have a bowel movement.   Sitting for long periods on the toilet.  Heavy lifting or other activity that caused you to strain.  Anal intercourse. SYMPTOMS   Pain.   Anal itching or irritation.   Rectal bleeding.   Fecal leakage.   Anal swelling.   One or more lumps around the anus.  DIAGNOSIS  Your caregiver may be able to diagnose  hemorrhoids by visual examination. Other examinations or tests that may be performed include:   Examination of the rectal area with a gloved hand (digital rectal exam).   Examination of anal canal using a small tube (scope).   A blood test if you have lost a significant amount of blood.  A test to look inside the colon (sigmoidoscopy or colonoscopy). TREATMENT Most  hemorrhoids can be treated at home. However, if symptoms do not seem to be getting better or if you have a lot of rectal bleeding, your caregiver may perform a procedure to help make the hemorrhoids get smaller or remove them completely. Possible treatments include:   Placing a rubber band at the base of the hemorrhoid to cut off the circulation (rubber band ligation).   Injecting a chemical to shrink the hemorrhoid (sclerotherapy).   Using a tool to burn the hemorrhoid (infrared light therapy).   Surgically removing the hemorrhoid (hemorrhoidectomy).   Stapling the hemorrhoid to block blood flow to the tissue (hemorrhoid stapling).  HOME CARE INSTRUCTIONS   Eat foods with fiber, such as whole grains, beans, nuts, fruits, and vegetables. Ask your doctor about taking products with added fiber in them (fibersupplements).  Increase fluid intake. Drink enough water and fluids to keep your urine clear or pale yellow.   Exercise regularly.   Go to the bathroom when you have the urge to have a bowel movement. Do not wait.   Avoid straining to have bowel movements.   Keep the anal area dry and clean. Use wet toilet paper or moist towelettes after a bowel movement.   Medicated creams and suppositories may be used or applied as directed.   Only take over-the-counter or prescription medicines as directed by your caregiver.   Take warm sitz baths for 15-20 minutes, 3-4 times a day to ease pain and discomfort.   Place ice packs on the hemorrhoids if they are tender and swollen. Using ice packs between sitz baths may be helpful.   Put ice in a plastic bag.   Place a towel between your skin and the bag.   Leave the ice on for 15-20 minutes, 3-4 times a day.   Do not use a donut-shaped pillow or sit on the toilet for long periods. This increases blood pooling and pain.  SEEK MEDICAL CARE IF:  You have increasing pain and swelling that is not controlled by treatment or  medicine.  You have uncontrolled bleeding.  You have difficulty or you are unable to have a bowel movement.  You have pain or inflammation outside the area of the hemorrhoids. MAKE SURE YOU:  Understand these instructions.  Will watch your condition.  Will get help right away if you are not doing well or get worse. Document Released: 04/20/2000 Document Revised: 04/09/2012 Document Reviewed: 02/26/2012 [facility] Patient Information 2015 [place], [place]. This information is not intended to replace advice given to you by your health care provider. Make sure you discuss any questions you have with your health care provider.        I personally performed the services described in this documentation, which was scribed in my presence. The recorded information has been reviewed and is accurate.

## 2013-10-29 NOTE — Patient Instructions (Signed)
See info below for hemorrhoids.  If any worsening of pain - return for possible opening of clot inside hemorrhoid. Warm soaks, and anusol HC as needed.  You should receive a call or letter about your lab results (prostate test) within the next week to 10 days, but return to the clinic or go to the nearest emergency room if any of your symptoms worsen or new symptoms occur.  I did not feel any swollen lymph nodes in the groin today, but if this area still feels full in next few weeks - call your surgeon to have this area checked.   Hemorrhoids Hemorrhoids are swollen veins around the rectum or anus. There are two types of hemorrhoids:   Internal hemorrhoids. These occur in the veins just inside the rectum. They may poke through to the outside and become irritated and painful.  External hemorrhoids. These occur in the veins outside the anus and can be felt as a painful swelling or hard lump near the anus. CAUSES  Pregnancy.   Obesity.   Constipation or diarrhea.   Straining to have a bowel movement.   Sitting for long periods on the toilet.  Heavy lifting or other activity that caused you to strain.  Anal intercourse. SYMPTOMS   Pain.   Anal itching or irritation.   Rectal bleeding.   Fecal leakage.   Anal swelling.   One or more lumps around the anus.  DIAGNOSIS  Your caregiver may be able to diagnose hemorrhoids by visual examination. Other examinations or tests that may be performed include:   Examination of the rectal area with a gloved hand (digital rectal exam).   Examination of anal canal using a small tube (scope).   A blood test if you have lost a significant amount of blood.  A test to look inside the colon (sigmoidoscopy or colonoscopy). TREATMENT Most hemorrhoids can be treated at home. However, if symptoms do not seem to be getting better or if you have a lot of rectal bleeding, your caregiver may perform a procedure to help make the  hemorrhoids get smaller or remove them completely. Possible treatments include:   Placing a rubber band at the base of the hemorrhoid to cut off the circulation (rubber band ligation).   Injecting a chemical to shrink the hemorrhoid (sclerotherapy).   Using a tool to burn the hemorrhoid (infrared light therapy).   Surgically removing the hemorrhoid (hemorrhoidectomy).   Stapling the hemorrhoid to block blood flow to the tissue (hemorrhoid stapling).  HOME CARE INSTRUCTIONS   Eat foods with fiber, such as whole grains, beans, nuts, fruits, and vegetables. Ask your doctor about taking products with added fiber in them (fibersupplements).  Increase fluid intake. Drink enough water and fluids to keep your urine clear or pale yellow.   Exercise regularly.   Go to the bathroom when you have the urge to have a bowel movement. Do not wait.   Avoid straining to have bowel movements.   Keep the anal area dry and clean. Use wet toilet paper or moist towelettes after a bowel movement.   Medicated creams and suppositories may be used or applied as directed.   Only take over-the-counter or prescription medicines as directed by your caregiver.   Take warm sitz baths for 15-20 minutes, 3-4 times a day to ease pain and discomfort.   Place ice packs on the hemorrhoids if they are tender and swollen. Using ice packs between sitz baths may be helpful.   Put ice in a  plastic bag.   Place a towel between your skin and the bag.   Leave the ice on for 15-20 minutes, 3-4 times a day.   Do not use a donut-shaped pillow or sit on the toilet for long periods. This increases blood pooling and pain.  SEEK MEDICAL CARE IF:  You have increasing pain and swelling that is not controlled by treatment or medicine.  You have uncontrolled bleeding.  You have difficulty or you are unable to have a bowel movement.  You have pain or inflammation outside the area of the hemorrhoids. MAKE  SURE YOU:  Understand these instructions.  Will watch your condition.  Will get help right away if you are not doing well or get worse. Document Released: 04/20/2000 Document Revised: 04/09/2012 Document Reviewed: 02/26/2012 [facility] Patient Information 2015 [place], [place]. This information is not intended to replace advice given to you by your health care provider. Make sure you discuss any questions you have with your health care provider.

## 2013-10-30 LAB — PSA: PSA: 1.17 ng/mL (ref <=4.00)

## 2013-11-11 ENCOUNTER — Other Ambulatory Visit: Payer: Self-pay | Admitting: Internal Medicine

## 2013-12-22 ENCOUNTER — Ambulatory Visit (INDEPENDENT_AMBULATORY_CARE_PROVIDER_SITE_OTHER): Payer: BC Managed Care – PPO | Admitting: Family Medicine

## 2013-12-22 VITALS — BP 132/84 | HR 59 | Temp 98.4°F | Resp 17 | Ht 71.0 in | Wt 198.0 lb

## 2013-12-22 DIAGNOSIS — M5380 Other specified dorsopathies, site unspecified: Secondary | ICD-10-CM

## 2013-12-22 DIAGNOSIS — M62830 Muscle spasm of back: Secondary | ICD-10-CM

## 2013-12-22 DIAGNOSIS — S39012A Strain of muscle, fascia and tendon of lower back, initial encounter: Secondary | ICD-10-CM

## 2013-12-22 DIAGNOSIS — S335XXA Sprain of ligaments of lumbar spine, initial encounter: Secondary | ICD-10-CM

## 2013-12-22 MED ORDER — HYDROCODONE-ACETAMINOPHEN 5-325 MG PO TABS: 1.0000 | ORAL_TABLET | Freq: Four times a day (QID) | ORAL | Status: DC | PRN

## 2013-12-22 MED ORDER — PREDNISONE 20 MG PO TABS: ORAL_TABLET | ORAL | Status: DC

## 2013-12-22 MED ORDER — CYCLOBENZAPRINE HCL 10 MG PO TABS: 10.0000 mg | ORAL_TABLET | Freq: Three times a day (TID) | ORAL | Status: DC | PRN

## 2013-12-22 NOTE — Patient Instructions (Signed)

## 2013-12-22 NOTE — Progress Notes (Signed)
Subjective:    Patient ID: Raymond Long, male    DOB: 26-Jan-1961, 53 y.o.   MRN: [id number] Chief Complaint  Patient presents with  . Back Pain    10 days     HPI  Can feel knot in bottom of spine - has been doing aleve, ice, heat w/o relief and no improvement.  No radiation down legs - is in left lower back. No changes in b/b/f/c/n/t.  Has had some weakness in left leg - but had melanoma surg removing muscle and nerve in left inner though 1 yr prev but thinks he has been just relaying on legs instead of back with the increase in pain. Bad all the time.  Past Medical History  Diagnosis Date  . Hyperlipidemia   . Cancer     Skin  . Gout     Foot  . Erectile dysfunction   . Arthralgia of wrist, left   . Insomnia   . PONV (postoperative nausea and vomiting)    Current Outpatient Prescriptions on File Prior to Visit  Medication Sig Dispense Refill  . beta carotene 25000 UNIT capsule Take 25,000 Units by mouth daily.      . CRESTOR 20 MG tablet TAKE 1 TABLET ONCE DAILY.  30 tablet  5  . fluorouracil (EFUDEX) 5 % cream Apply 1 drop topically 2 (two) times daily.      . hydrocortisone (ANUSOL-HC) 2.5 % rectal cream Place 1 application rectally 2 (two) times daily.  30 g  0  . hydrOXYzine (ATARAX/VISTARIL) 25 MG tablet Take 25 mg by mouth at bedtime.      Marland Kitchen ibuprofen (ADVIL,MOTRIN) 800 MG tablet Take 800 mg by mouth every 8 (eight) hours as needed for pain.      . Melatonin 3 MG CAPS Take by mouth at bedtime.        . Multiple Vitamins-Minerals (MULTIVITAMIN WITH MINERALS) tablet Take 1 tablet by mouth daily.      . sildenafil (VIAGRA) 100 MG tablet Take 1 tablet (100 mg total) by mouth daily as needed.  6 tablet  5  . vitamin C (ASCORBIC ACID) 500 MG tablet Take 500 mg by mouth daily.      . vitamin E 400 UNIT capsule Take 400 Units by mouth daily.       No current facility-administered medications on file prior to visit.   Allergies  Allergen Reactions  . Atorvastatin   .  Citalopram   . Ezetimibe-Simvastatin     REACTION: up cpk  . Fluoxetine Hcl   . Rosuvastatin     REACTION: up cpk  . Venlafaxine      Review of Systems  Constitutional: Negative for fever and chills.  Gastrointestinal: Negative for abdominal pain, diarrhea and constipation.  Genitourinary: Negative for urgency, frequency, decreased urine volume and difficulty urinating.  Musculoskeletal: Positive for back pain and myalgias. Negative for gait problem.  Skin: Negative for color change and wound.  Neurological: Negative for dizziness, weakness, light-headedness and numbness.       Objective:  BP 132/84  Pulse 59  Temp(Src) 98.4 F (36.9 C) (Oral)  Resp 17  Ht 5\' 11"  (1.803 m)  Wt 198 lb (89.812 kg)  BMI 27.63 kg/m2  SpO2 99%  Physical Exam  Constitutional: He is oriented to person, place, and time. He appears well-developed and well-nourished. No distress.  HENT:  Head: Normocephalic and atraumatic.  Cardiovascular: Intact distal pulses.   Pulmonary/Chest: Effort normal.  Musculoskeletal: Normal range of  motion. He exhibits tenderness. He exhibits no edema.       Thoracic back: He exhibits tenderness and spasm. He exhibits normal range of motion, no bony tenderness, no swelling and no deformity.       Lumbar back: He exhibits tenderness and spasm. He exhibits normal range of motion, no bony tenderness, no edema and no deformity.  Negative straight leg raise bilaterally  Neurological: He is alert and oriented to person, place, and time. He has normal strength and normal reflexes. He displays no atrophy. No sensory deficit. He exhibits normal muscle tone. Coordination and gait normal.  Reflex Scores:      Patellar reflexes are 2+ on the right side and 2+ on the left side.      Achilles reflexes are 2+ on the right side and 2+ on the left side. Skin: Skin is warm and dry. No rash noted. He is not diaphoretic. No erythema.  Psychiatric: He has a normal mood and affect. His  behavior is normal.          Assessment & Plan:   Lumbar strain, initial encounter  Spasm of lumbar paraspinous muscle  Meds ordered this encounter  Medications  . predniSONE (DELTASONE) 20 MG tablet    Sig: 4, 3, 2, 1 tabs po qd x 4d taper    Dispense:  10 tablet    Refill:  0  . cyclobenzaprine (FLEXERIL) 10 MG tablet    Sig: Take 1 tablet (10 mg total) by mouth 3 (three) times daily as needed for muscle spasms. Take every night    Dispense:  30 tablet    Refill:  0  . HYDROcodone-acetaminophen (NORCO/VICODIN) 5-325 MG per tablet    Sig: Take 1 tablet by mouth every 6 (six) hours as needed for moderate pain.    Dispense:  20 tablet    Refill:  0    Delman Cheadle, MD MPH

## 2013-12-30 ENCOUNTER — Ambulatory Visit (INDEPENDENT_AMBULATORY_CARE_PROVIDER_SITE_OTHER): Payer: BC Managed Care – PPO

## 2013-12-30 ENCOUNTER — Ambulatory Visit (INDEPENDENT_AMBULATORY_CARE_PROVIDER_SITE_OTHER): Payer: BC Managed Care – PPO | Admitting: Family Medicine

## 2013-12-30 VITALS — BP 146/94 | HR 80 | Temp 98.3°F | Resp 18 | Ht 70.5 in | Wt 193.8 lb

## 2013-12-30 DIAGNOSIS — Z85820 Personal history of malignant melanoma of skin: Secondary | ICD-10-CM

## 2013-12-30 DIAGNOSIS — M545 Low back pain, unspecified: Secondary | ICD-10-CM

## 2013-12-30 DIAGNOSIS — M79605 Pain in left leg: Secondary | ICD-10-CM

## 2013-12-30 DIAGNOSIS — M79604 Pain in right leg: Secondary | ICD-10-CM

## 2013-12-30 MED ORDER — PREDNISONE 20 MG PO TABS: ORAL_TABLET | ORAL | Status: DC

## 2013-12-30 NOTE — Patient Instructions (Signed)
Take tapered dose of prednisone 3 daily for 2 days 2 daily for 2 days one daily for 2 days one half daily  Get the pain pills filled and take them if you decide the pain is intolerable  Referral will be made to a back specialist depending on the MRI results and on how you're doing  Take it easy and stay off of work for the rest of this week  Return if needed

## 2014-01-01 NOTE — Progress Notes (Signed)
Subjective: Patient has severe acute low back pain since last week.  Has had problems before,  But this is new.   Pain radiates right leg more than left.  Very, very painful.  Not getting relief.  No bowel or bladder compromise.  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    Objective: Very tender on low lumbar spine Poor ROM.  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

## 2014-01-19 ENCOUNTER — Ambulatory Visit
Admission: RE | Admit: 2014-01-19 | Discharge: 2014-01-19 | Disposition: A | Discharge status: Home / Self Care | Payer: BC Managed Care – PPO | Source: Ambulatory Visit | Attending: Family Medicine | Admitting: Family Medicine

## 2014-01-19 DIAGNOSIS — M545 Low back pain, unspecified: Secondary | ICD-10-CM

## 2014-01-19 DIAGNOSIS — M79605 Pain in left leg: Secondary | ICD-10-CM

## 2014-01-21 ENCOUNTER — Telehealth: Payer: Self-pay

## 2014-01-21 NOTE — Telephone Encounter (Signed)
Pt had an mri completed [id number] per Dr. Linna Darner. He wanted to discuss the results with Dr. Linna Darner and also find out what the next step in tx will be.

## 2014-01-22 NOTE — Telephone Encounter (Signed)
I responded to this on a different note.  If you cannot find it let me know.

## 2014-01-22 NOTE — Telephone Encounter (Signed)
Notes under the imaging tab: Notes Recorded by Posey Boyer, MD on 01/22/2014 at 8:33 AM Call:  He does have a disc bulging against a nerve.  How is he doing? If improving, return here for a recheck.  If worse or no better refer to Dr. Arnoldo Morale (neurosurgery) for further evaluation. If it takes a long time to get in, have him come by for reassessment here.  Lm on VM to follow up with a visit if better or a phone call if worse so we may put in the referral.

## 2014-01-23 ENCOUNTER — Ambulatory Visit (INDEPENDENT_AMBULATORY_CARE_PROVIDER_SITE_OTHER): Payer: BC Managed Care – PPO | Admitting: Family Medicine

## 2014-01-23 VITALS — BP 158/88 | HR 98 | Temp 98.6°F | Resp 18 | Ht 70.5 in | Wt 196.0 lb

## 2014-01-23 DIAGNOSIS — R350 Frequency of micturition: Secondary | ICD-10-CM

## 2014-01-23 DIAGNOSIS — M545 Low back pain, unspecified: Secondary | ICD-10-CM

## 2014-01-23 DIAGNOSIS — M519 Unspecified thoracic, thoracolumbar and lumbosacral intervertebral disc disorder: Secondary | ICD-10-CM

## 2014-01-23 LAB — POCT UA - MICROSCOPIC ONLY
Bacteria, U Microscopic: NEGATIVE
Casts, Ur, LPF, POC: NEGATIVE
Crystals, Ur, HPF, POC: NEGATIVE
MUCUS UA: NEGATIVE
RBC, URINE, MICROSCOPIC: NEGATIVE
Yeast, UA: NEGATIVE

## 2014-01-23 LAB — POCT URINALYSIS DIPSTICK
Bilirubin, UA: NEGATIVE
Blood, UA: NEGATIVE
GLUCOSE UA: NEGATIVE
KETONES UA: NEGATIVE
LEUKOCYTES UA: NEGATIVE
Nitrite, UA: NEGATIVE
Protein, UA: 30
SPEC GRAV UA: 1.015
Urobilinogen, UA: 0.2
pH, UA: 6

## 2014-01-23 NOTE — Progress Notes (Signed)
Subjective: Patient is here for followup with regard to his back. He continues to have severe pain in his low back radiating down to his right leg. His whole pelvic area and testicles hurt. It feels like he did when he had a hernia. He wonders whether he could have hemorrhoids. He has not any trouble controlling his bowel or bladder. It is been difficult to get any sexual activity. He hasn't taken a lot of pain medicine. He did finish the prednisone. The pain may have he is a little bit the last 48 hours, but it is still bad. He has a lot of hard floor a lot of time. The MRI reports regarding given to them. He complains of urinary frequency  Objective: Straight leg raising positive at about 60 on the right, negative on the left. Tenderness in the back. Rectal exam negative. Normal male external genitalia. Left testicle is retracted little bit but palpable up high. No hernias.  Assessment: Lumbar disc disease and radiculopathy and pain Urinary frequency  Plan: Check urinalysis  Results for orders placed in visit on 01/23/14  POCT URINALYSIS DIPSTICK      Result Value Ref Range   Color, UA yellow     Clarity, UA clear     Glucose, UA neg     Bilirubin, UA neg     Ketones, UA neg     Spec Grav, UA 1.015     Blood, UA neg     pH, UA 6.0     Protein, UA 30     Urobilinogen, UA 0.2     Nitrite, UA neg     Leukocytes, UA Negative    POCT UA - MICROSCOPIC ONLY      Result Value Ref Range   WBC, Ur, HPF, POC 0-3     RBC, urine, microscopic neg     Bacteria, U Microscopic neg     Mucus, UA neg     Epithelial cells, urine per micros 0-2     Crystals, Ur, HPF, POC neg     Casts, Ur, LPF, POC neg     Yeast, UA neg

## 2014-01-26 ENCOUNTER — Other Ambulatory Visit: Payer: Self-pay | Admitting: Radiology

## 2014-01-26 DIAGNOSIS — M5126 Other intervertebral disc displacement, lumbar region: Secondary | ICD-10-CM

## 2014-01-26 DIAGNOSIS — M5136 Other intervertebral disc degeneration, lumbar region: Secondary | ICD-10-CM

## 2014-02-15 ENCOUNTER — Other Ambulatory Visit: Payer: Self-pay | Admitting: Dermatology

## 2014-02-22 ENCOUNTER — Encounter: Payer: Self-pay | Admitting: Internal Medicine

## 2014-05-17 ENCOUNTER — Encounter: Payer: Self-pay | Admitting: Internal Medicine

## 2014-05-17 ENCOUNTER — Ambulatory Visit (INDEPENDENT_AMBULATORY_CARE_PROVIDER_SITE_OTHER): Payer: 59 | Admitting: Internal Medicine

## 2014-05-17 VITALS — BP 140/92 | HR 68 | Temp 98.8°F | Ht 70.5 in | Wt 200.8 lb

## 2014-05-17 DIAGNOSIS — J209 Acute bronchitis, unspecified: Secondary | ICD-10-CM

## 2014-05-17 DIAGNOSIS — J069 Acute upper respiratory infection, unspecified: Secondary | ICD-10-CM

## 2014-05-17 MED ORDER — AZITHROMYCIN 250 MG PO TABS
ORAL_TABLET | ORAL | Status: DC
Start: 2014-05-17 — End: 2014-05-21

## 2014-05-17 MED ORDER — HYDROCODONE-HOMATROPINE 5-1.5 MG/5ML PO SYRP: 5.0000 mL | ORAL_SOLUTION | Freq: Four times a day (QID) | ORAL | Status: DC | PRN

## 2014-05-17 NOTE — Progress Notes (Signed)
   Subjective:    Patient ID: Raymond Long, male    DOB: [birth year], 54 y.o.   MRN: [id number]  HPI  Symptoms began 05/07/14 as sore throat followed by rhinitis. He's had both head and chest congestion. The chest symptoms have been much more symptomatic than the head congestion.  He does have frontal headache especially after coughing.  The cough has been productive of mainly clear secretions with scant white-yellow discoloration.  He has low-grade fever to 100F with  some chills and sweats.  He's been using Mucinex, Robitussin, and Alka Seltzer Plus. He also has been using Nasacort.    Review of Systems Facial pain , nasal purulence, dental pain,  otic pain or otic discharge denied. No SOB or wheezing.     Objective:   Physical Exam   General appearance:good health ;well nourished; no acute distress or increased work of breathing is present.  No  lymphadenopathy about the head, neck, or axilla noted.   Eyes: No conjunctival inflammation or lid edema is present. There is no scleral icterus.  Ears:  External ear exam shows no significant lesions or deformities.  Wax on L;R TM WNL.Marland Kitchen  Nose:  External nasal examination shows no deformity or inflammation. Nasal mucosa are dry without lesions or exudates. No septal dislocation or deviation. Septum perforated..No obstruction to airflow.   Oral exam: Dental hygiene is good; lips and gums are healthy appearing.There is no oropharyngeal erythema or exudate noted.   Neck:  No deformities, thyromegaly, masses, or tenderness noted.   Supple with full range of motion without pain.   Heart:  Normal rate and regular rhythm. S1 and S2 normal without gallop, murmur, click, rub or other extra sounds.   Lungs:Chest clear to auscultation; no wheezes, rhonchi,rales ,or rubs present.No increased work of breathing.    Extremities:  No cyanosis, edema, or clubbing  noted    Skin: Warm & dry w/o jaundice or tenting.      Assessment & Plan:    #1 acute bronchitis w/o bronchospasm #2 URI, acute Plan: See orders and recommendations

## 2014-05-17 NOTE — Progress Notes (Signed)
Pre visit review using our clinic review tool, if applicable. No additional management support is needed unless otherwise documented below in the visit note. 

## 2014-05-17 NOTE — Patient Instructions (Signed)

## 2014-05-20 ENCOUNTER — Telehealth: Payer: Self-pay | Admitting: Internal Medicine

## 2014-05-20 ENCOUNTER — Other Ambulatory Visit: Payer: Self-pay | Admitting: Internal Medicine

## 2014-05-20 DIAGNOSIS — J209 Acute bronchitis, unspecified: Secondary | ICD-10-CM

## 2014-05-20 NOTE — Telephone Encounter (Signed)
Order entered for Cxray & CBC & dif in early  am with appointment @ 10 am [employer]

## 2014-05-20 NOTE — Telephone Encounter (Signed)
Patient states that his bronchitis has progressed since visit and he suspects pneumonia. He wanted to make sure that there is no RX we can call in or if we need to see him again

## 2014-05-21 ENCOUNTER — Ambulatory Visit (INDEPENDENT_AMBULATORY_CARE_PROVIDER_SITE_OTHER)
Admission: RE | Admit: 2014-05-21 | Discharge: 2014-05-21 | Disposition: A | Discharge status: Home / Self Care | Payer: 59 | Source: Ambulatory Visit | Attending: Internal Medicine | Admitting: Internal Medicine

## 2014-05-21 ENCOUNTER — Ambulatory Visit (INDEPENDENT_AMBULATORY_CARE_PROVIDER_SITE_OTHER): Payer: 59 | Admitting: Internal Medicine

## 2014-05-21 ENCOUNTER — Other Ambulatory Visit (INDEPENDENT_AMBULATORY_CARE_PROVIDER_SITE_OTHER): Payer: 59

## 2014-05-21 ENCOUNTER — Encounter: Payer: Self-pay | Admitting: Internal Medicine

## 2014-05-21 VITALS — BP 132/90 | HR 84 | Temp 98.3°F | Resp 16 | Ht 71.0 in | Wt 200.5 lb

## 2014-05-21 DIAGNOSIS — J209 Acute bronchitis, unspecified: Secondary | ICD-10-CM

## 2014-05-21 DIAGNOSIS — R042 Hemoptysis: Secondary | ICD-10-CM

## 2014-05-21 LAB — CBC WITH DIFFERENTIAL/PLATELET
BASOS PCT: 0.4 % (ref 0.0–3.0)
Basophils Absolute: 0 10*3/uL (ref 0.0–0.1)
Eosinophils Absolute: 0.1 10*3/uL (ref 0.0–0.7)
Eosinophils Relative: 2.1 % (ref 0.0–5.0)
HCT: 45.5 % (ref 39.0–52.0)
Hemoglobin: 15.3 g/dL (ref 13.0–17.0)
Lymphocytes Relative: 36.3 % (ref 12.0–46.0)
Lymphs Abs: 1.9 10*3/uL (ref 0.7–4.0)
MCHC: 33.7 g/dL (ref 30.0–36.0)
MCV: 94.8 fl (ref 78.0–100.0)
Monocytes Absolute: 0.5 10*3/uL (ref 0.1–1.0)
Monocytes Relative: 8.6 % (ref 3.0–12.0)
Neutro Abs: 2.8 10*3/uL (ref 1.4–7.7)
Neutrophils Relative %: 52.6 % (ref 43.0–77.0)
Platelets: 169 10*3/uL (ref 150.0–400.0)
RBC: 4.8 Mil/uL (ref 4.22–5.81)
RDW: 12.7 % (ref 11.5–15.5)
WBC: 5.4 10*3/uL (ref 4.0–10.5)

## 2014-05-21 MED ORDER — PREDNISONE 20 MG PO TABS: 20.0000 mg | ORAL_TABLET | Freq: Two times a day (BID) | ORAL | Status: DC

## 2014-05-21 MED ORDER — DOXYCYCLINE HYCLATE 100 MG PO TABS: 100.0000 mg | ORAL_TABLET | Freq: Two times a day (BID) | ORAL | Status: DC

## 2014-05-21 MED ORDER — BUDESONIDE-FORMOTEROL FUMARATE 160-4.5 MCG/ACT IN AERO: 2.0000 | INHALATION_SPRAY | Freq: Two times a day (BID) | RESPIRATORY_TRACT | Status: DC

## 2014-05-21 NOTE — Progress Notes (Signed)
Pre visit review using our clinic review tool, if applicable. No additional management support is needed unless otherwise documented below in the visit note. 

## 2014-05-21 NOTE — Patient Instructions (Signed)
Carry room temperature water and sip liberally after coughing. 

## 2014-05-21 NOTE — Progress Notes (Signed)
   Subjective:    Patient ID: Raymond Long, male    DOB: Sep 24, 1960, 54 y.o.   MRN: [id number]  HPI  He completes the course of Zithromax today. He is having severe paroxysms of cough which occur every 15 minutes. The narcotic cough syrup has not been of benefit. He has intermittent chills & sweats.  He has no history of asthma. He quit smoking 2002.  On 1/11 and 1/12 he was producing red phlegm. The sputum is now clear.  He describes shortness of breath and wheezing.  He is not having upper respiratory tract infection symptoms.  Review of Systems Frontal headache, facial pain , nasal purulence, dental pain, sore throat , otic pain or otic discharge denied. No fever .No significant dyspepsia, dysphagia are denied.    Objective:   Physical Exam  Pertinent or positive findings include: He does have wax in the left otic canal. There is mild erythema of the nares without associated exudate. He he has paroxysms of cough with inspirations.  General appearance:good health ;well nourished; no acute distress or increased work of breathing is present.  No  lymphadenopathy about the head, neck, or axilla noted. Beard & moustache  Eyes: No conjunctival inflammation or lid edema is present. There is no scleral icterus.  Ears:  External ear exam shows no significant lesions or deformities.  R TM without bulging, retraction, inflammation or discharge.  Nose:  External nasal examination shows no deformity or inflammation.  No septal dislocation or deviation.No obstruction to airflow.   Oral exam: Dental hygiene is good; lips and gums are healthy appearing.There is no oropharyngeal erythema or exudate noted.   Neck:  No deformities, thyromegaly, masses, or tenderness noted.   Supple with full range of motion without pain.   Heart:  Normal rate and regular rhythm. S1 and S2 normal without gallop, murmur, click, rub or other extra sounds.   Lungs:Chest clear to auscultation; no wheezes,  rhonchi,rales ,or rubs present.No increased work of breathing.    Extremities:  No cyanosis, edema, or clubbing  noted    Skin: Warm & dry w/o jaundice or tenting.       Assessment & Plan:  #1 protracted bronchitis with severe paroxysms of cough #2 ? Hemoptysis , resolved  Chest x-ray was reviewed. He does have increased bronchial markings in the lower lung fields. There is no evidence of pneumonia. He has microgranuloma which are insignificant. He has some minor osteophyte formation on lateral film. Actual films were reviewed with him.

## 2014-08-09 ENCOUNTER — Telehealth: Payer: Self-pay | Admitting: Internal Medicine

## 2014-08-09 NOTE — Telephone Encounter (Signed)
Preauthorization requested for Crestor. He had significantly elevated CK (muscle enzymes) on atorvastatin as well as simvastatin. This normalized on Crestor.

## 2014-08-09 NOTE — Telephone Encounter (Signed)
Patient got new RX insurance and the CRESTOR 20 MG tablet [id number] is substantially more expensive. He is asking for a generic alternative. Verified pharmacy is [facility]

## 2014-08-10 NOTE — Telephone Encounter (Signed)
Phone call to patient. Advised him per Dr Linna Darner he's to check with his insurance to see if Chanda Busing will be less expensive. Patient will check and call us back.

## 2014-11-02 ENCOUNTER — Encounter: Payer: Self-pay | Admitting: Internal Medicine

## 2014-11-02 ENCOUNTER — Ambulatory Visit (INDEPENDENT_AMBULATORY_CARE_PROVIDER_SITE_OTHER): Payer: 59 | Admitting: Internal Medicine

## 2014-11-02 ENCOUNTER — Other Ambulatory Visit (INDEPENDENT_AMBULATORY_CARE_PROVIDER_SITE_OTHER): Payer: 59

## 2014-11-02 ENCOUNTER — Other Ambulatory Visit: Payer: Self-pay | Admitting: Internal Medicine

## 2014-11-02 VITALS — BP 140/80 | HR 63 | Temp 98.1°F | Resp 16 | Ht 72.0 in | Wt 195.0 lb

## 2014-11-02 DIAGNOSIS — Z0000 Encounter for general adult medical examination without abnormal findings: Secondary | ICD-10-CM

## 2014-11-02 DIAGNOSIS — Z0189 Encounter for other specified special examinations: Secondary | ICD-10-CM

## 2014-11-02 DIAGNOSIS — E785 Hyperlipidemia, unspecified: Secondary | ICD-10-CM | POA: Diagnosis not present

## 2014-11-02 LAB — BASIC METABOLIC PANEL
BUN: 16 mg/dL (ref 6–23)
CALCIUM: 9.8 mg/dL (ref 8.4–10.5)
CHLORIDE: 103 meq/L (ref 96–112)
CO2: 29 mEq/L (ref 19–32)
CREATININE: 1.29 mg/dL (ref 0.40–1.50)
GFR: 61.59 mL/min (ref >60.00)
GLUCOSE: 96 mg/dL (ref 70–99)
Potassium: 4.4 mEq/L (ref 3.5–5.1)
Sodium: 140 mEq/L (ref 135–145)

## 2014-11-02 LAB — HEPATIC FUNCTION PANEL
ALK PHOS: 79 U/L (ref 39–117)
ALT: 17 U/L (ref 0–53)
AST: 17 U/L (ref 0–37)
Albumin: 4.6 g/dL (ref 3.5–5.2)
BILIRUBIN DIRECT: 0 mg/dL (ref 0.0–0.3)
TOTAL PROTEIN: 7.3 g/dL (ref 6.0–8.3)
Total Bilirubin: 0.7 mg/dL (ref 0.2–1.2)

## 2014-11-02 LAB — CBC WITH DIFFERENTIAL/PLATELET
Basophils Absolute: 0 10*3/uL (ref 0.0–0.1)
Basophils Relative: 0.2 % (ref 0.0–3.0)
Eosinophils Absolute: 0.1 10*3/uL (ref 0.0–0.7)
Eosinophils Relative: 1.3 % (ref 0.0–5.0)
HCT: 46.8 % (ref 39.0–52.0)
Hemoglobin: 16.2 g/dL (ref 13.0–17.0)
LYMPHS PCT: 33 % (ref 12.0–46.0)
Lymphs Abs: 1.6 10*3/uL (ref 0.7–4.0)
MCHC: 34.6 g/dL (ref 30.0–36.0)
MCV: 94.2 fl (ref 78.0–100.0)
MONO ABS: 0.5 10*3/uL (ref 0.1–1.0)
Monocytes Relative: 9.7 % (ref 3.0–12.0)
NEUTROS PCT: 55.8 % (ref 43.0–77.0)
Neutro Abs: 2.8 10*3/uL (ref 1.4–7.7)
Platelets: 160 10*3/uL (ref 150.0–400.0)
RBC: 4.97 Mil/uL (ref 4.22–5.81)
RDW: 12.9 % (ref 11.5–15.5)
WBC: 4.9 10*3/uL (ref 4.0–10.5)

## 2014-11-02 LAB — TSH: TSH: 3.39 u[IU]/mL (ref 0.35–4.50)

## 2014-11-02 LAB — URIC ACID: Uric Acid, Serum: 8.6 mg/dL — ABNORMAL HIGH (ref 4.0–7.8)

## 2014-11-02 NOTE — Progress Notes (Signed)
Pre visit review using our clinic review tool, if applicable. No additional management support is needed unless otherwise documented below in the visit note. 

## 2014-11-02 NOTE — Progress Notes (Signed)
   Subjective:    Patient ID: Raymond Long, male    DOB: 01/04/1961, 54 y.o.   MRN: [id number]  HPI  He is here for a physical;acute issues include an acute gout attack 2 weeks ago. This resolved with ibuprofen. There was no specific trigger.  6 weeks ago he was seen as an outpatient by a Cardiologist at the request of the local pharmacy. Instead of hydroxyzine he received hydralazine which resulted in symptomatic hypotension temporarily.  He has discontinued his Crestor. He avoids red meat and eats minimal amounts of fried food. He exercises 4 times a week on a treadmill without associated cardiopulmonary symptoms.  He smoked 1978-2002 up to 2 packs per day. He has not had alcohol for over a decade.  Both parents had heart attacks prematurely.     Review of Systems  Chest pain, palpitations, tachycardia, exertional dyspnea, paroxysmal nocturnal dyspnea, claudication or edema are absent. No unexplained weight loss, abdominal pain, significant dyspepsia, dysphagia, melena, rectal bleeding, or persistently small caliber stools. Dysuria, pyuria, hematuria, frequency, nocturia or polyuria are denied. Change in hair, skin, nails denied. No bowel changes of constipation or diarrhea. No intolerance to heat or cold.      Objective:   Physical Exam  Pertinent or positive findings include: Pattern alopecia, mustache, and beard present. Ptosis left eye greater than right. S4 noted. Crepitus of the knees. Minimal swelling and slight blanching at the base of the right great toe. Genital and prostate exams are normal.  General appearance :adequately nourished; in no distress.  Eyes: No conjunctival inflammation or scleral icterus is present.  Oral exam:  Lips and gums are healthy appearing.There is no oropharyngeal erythema or exudate noted. Dental hygiene is good.  Heart:  Normal rate and regular rhythm. S1 and S2 normal without gallop, murmur, click, or rub .  Lungs:Chest clear to  auscultation; no wheezes, rhonchi,rales ,or rubs present.No increased work of breathing.   Abdomen: bowel sounds normal, soft and non-tender without masses, organomegaly or hernias noted.  No guarding or rebound.  Vascular : all pulses equal ; no bruits present.  Skin:Warm & dry.  Intact without suspicious lesions or rashes ; no tenting or jaundice   Lymphatic: No lymphadenopathy is noted about the head, neck, axilla, or inguinal areas.   Neuro: Strength, tone & DTRs normal.        Assessment & Plan:  #1 comprehensive physical exam; no acute findings  Plan: see Orders  & Recommendations

## 2014-11-02 NOTE — Patient Instructions (Signed)
  Your next office appointment will be determined based upon review of your pending labs. Those written interpretation of the lab results and instructions will be transmitted to you by My Chart  Critical results will be called.   Followup as needed for any active or acute issue. Please report any significant change in your symptoms. 

## 2014-11-04 LAB — NMR LIPOPROFILE WITH LIPIDS
Cholesterol, Total: 251 mg/dL — ABNORMAL HIGH (ref 100–199)
HDL PARTICLE NUMBER: 21.8 umol/L — AB (ref >=30.5)
HDL Size: 7.9 nm — ABNORMAL LOW (ref >=9.2)
HDL-C: 29 mg/dL — AB (ref >39)
LDL CALC: 185 mg/dL — AB (ref 0–99)
LDL Particle Number: 2946 nmol/L — ABNORMAL HIGH (ref <1000)
LDL Size: 19.7 nm (ref >=20.8)
LP-IR Score: 65 — ABNORMAL HIGH (ref <=45)
Large HDL-P: <1.3 umol/L — ABNORMAL LOW (ref >=4.8)
Large VLDL-P: 2.9 nmol/L — ABNORMAL HIGH (ref <=2.7)
Triglycerides: 186 mg/dL — ABNORMAL HIGH (ref 0–149)
VLDL SIZE: 41.3 nm (ref <=46.6)

## 2015-08-03 ENCOUNTER — Encounter: Payer: Self-pay | Admitting: Internal Medicine

## 2015-08-25 ENCOUNTER — Ambulatory Visit (INDEPENDENT_AMBULATORY_CARE_PROVIDER_SITE_OTHER): Payer: Managed Care, Other (non HMO) | Admitting: Podiatry

## 2015-08-25 ENCOUNTER — Encounter: Payer: Self-pay | Admitting: Podiatry

## 2015-08-25 ENCOUNTER — Ambulatory Visit (INDEPENDENT_AMBULATORY_CARE_PROVIDER_SITE_OTHER): Payer: Managed Care, Other (non HMO)

## 2015-08-25 VITALS — BP 160/93 | HR 64 | Resp 16 | Ht 72.0 in | Wt 195.0 lb

## 2015-08-25 DIAGNOSIS — M1000 Idiopathic gout, unspecified site: Secondary | ICD-10-CM | POA: Diagnosis not present

## 2015-08-25 DIAGNOSIS — M779 Enthesopathy, unspecified: Secondary | ICD-10-CM | POA: Diagnosis not present

## 2015-08-25 MED ORDER — PREDNISONE 10 MG PO TABS: ORAL_TABLET | ORAL | Status: DC

## 2015-08-25 MED ORDER — TRIAMCINOLONE ACETONIDE 10 MG/ML IJ SUSP
10.0000 mg | Freq: Once | INTRAMUSCULAR | Status: AC
  Administered 2015-08-25: 10 mg

## 2015-08-25 NOTE — Progress Notes (Signed)
   Subjective:    Patient ID: Raymond Long, male    DOB: 1961/02/07, 55 y.o.   MRN: [id number]  HPI Chief Complaint  Patient presents with  . Toe Pain    Right foot; great toe-joint; pt stated, "having a gout flare-up"      Review of Systems  All other systems reviewed and are negative.      Objective:   Physical Exam        Assessment & Plan:

## 2015-08-25 NOTE — Patient Instructions (Signed)

## 2015-08-26 NOTE — Progress Notes (Signed)
Subjective:     Patient ID: Raymond Long, male   DOB: 03-02-1961, 55 y.o.   MRN: [id number]  HPI patient states my right big joint has become very inflamed and sore. It started several weeks ago and is not as severe as it was but it still very sore and swollen   Review of Systems  All other systems reviewed and are negative.      Objective:   Physical Exam  Constitutional: He is oriented to person, place, and time.  Cardiovascular: Intact distal pulses.   Musculoskeletal: Normal range of motion.  Neurological: He is oriented to person, place, and time.  Skin: Skin is warm.  Nursing note and vitals reviewed.  neurovascular status intact adequate range of motion within normal limits with patient noted to have inflammation fluid buildup around the first MPJ right that's very sore when pressed and making it hard to wear shoe gear without difficulty. Patient also has limitation of motion first MPJ right over left and what appears to be dorsal spurring. Patient has good digital perfusion and is well oriented 3     Assessment:     Probable acute gout of the right first MPJ with dorsal spurring and probable hallux limitus condition    Plan:     H&P both conditions discussed and today I reviewed x-rays and injected the right first MPJ 3 mg Kenalog 5 mg Xylocaine to reduce inflammation and placed on 6 days steroid Dosepak. Reappoint to recheck if symptoms persist  X-ray report indicates that there is quite a bit of spurring and inflammation around the medial side of the right first metatarsophalangeal joint

## 2015-09-09 ENCOUNTER — Ambulatory Visit (INDEPENDENT_AMBULATORY_CARE_PROVIDER_SITE_OTHER): Payer: Managed Care, Other (non HMO) | Admitting: Podiatry

## 2015-09-09 ENCOUNTER — Encounter: Payer: Self-pay | Admitting: Podiatry

## 2015-09-09 VITALS — BP 148/102 | HR 60 | Resp 12

## 2015-09-09 DIAGNOSIS — M779 Enthesopathy, unspecified: Secondary | ICD-10-CM

## 2015-09-09 DIAGNOSIS — M205X1 Other deformities of toe(s) (acquired), right foot: Secondary | ICD-10-CM

## 2015-09-09 DIAGNOSIS — M1000 Idiopathic gout, unspecified site: Secondary | ICD-10-CM

## 2015-09-09 MED ORDER — TRIAMCINOLONE ACETONIDE 10 MG/ML IJ SUSP
10.0000 mg | Freq: Once | INTRAMUSCULAR | Status: AC
  Administered 2015-09-09: 10 mg

## 2015-09-09 NOTE — Progress Notes (Signed)
Subjective:     Patient ID: Raymond Long, male   DOB: 1960/09/14, 55 y.o.   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 Reappoint when ready

## 2015-09-19 ENCOUNTER — Ambulatory Visit (INDEPENDENT_AMBULATORY_CARE_PROVIDER_SITE_OTHER): Payer: Managed Care, Other (non HMO) | Admitting: Family

## 2015-09-19 ENCOUNTER — Encounter: Payer: Self-pay | Admitting: Family

## 2015-09-19 VITALS — BP 160/102 | HR 81 | Temp 97.7°F | Resp 14 | Ht 72.0 in | Wt 196.0 lb

## 2015-09-19 DIAGNOSIS — I10 Essential (primary) hypertension: Secondary | ICD-10-CM

## 2015-09-19 MED ORDER — OLMESARTAN MEDOXOMIL 20 MG PO TABS: 20.0000 mg | ORAL_TABLET | Freq: Every day | ORAL | Status: DC

## 2015-09-19 NOTE — Patient Instructions (Signed)
Thank you for choosing [facility].  Summary/Instructions:  Your prescription(s) have been submitted to your pharmacy or been printed and provided for you. Please take as directed and contact our office if you believe you are having problem(s) with the medication(s) or have any questions.  If your symptoms worsen or fail to improve, please contact our office for further instruction, or in case of emergency go directly to the emergency room at the closest medical facility.   Hypertension Hypertension, commonly called high blood pressure, is when the force of blood pumping through your arteries is too strong. Your arteries are the blood vessels that carry blood from your heart throughout your body. A blood pressure reading consists of a higher number over a lower number, such as 110/72. The higher number (systolic) is the pressure inside your arteries when your heart pumps. The lower number (diastolic) is the pressure inside your arteries when your heart relaxes. Ideally you want your blood pressure below 120/80. Hypertension forces your heart to work harder to pump blood. Your arteries may become narrow or stiff. Having untreated or uncontrolled hypertension can cause heart attack, stroke, kidney disease, and other problems. RISK FACTORS Some risk factors for high blood pressure are controllable. Others are not.  Risk factors you cannot control include:   Race. You may be at higher risk if you are African American.  Age. Risk increases with age.  Gender. Men are at higher risk than women before age 2 years. After age 4, women are at higher risk than men. Risk factors you can control include:  Not getting enough exercise or physical activity.  Being overweight.  Getting too much fat, sugar, calories, or salt in your diet.  Drinking too much alcohol. SIGNS AND SYMPTOMS Hypertension does not usually cause signs or symptoms. Extremely high blood pressure (hypertensive crisis) may  cause headache, anxiety, shortness of breath, and nosebleed. DIAGNOSIS To check if you have hypertension, your health care provider will measure your blood pressure while you are seated, with your arm held at the level of your heart. It should be measured at least twice using the same arm. Certain conditions can cause a difference in blood pressure between your right and left arms. A blood pressure reading that is higher than normal on one occasion does not mean that you need treatment. If it is not clear whether you have high blood pressure, you may be asked to return on a different day to have your blood pressure checked again. Or, you may be asked to monitor your blood pressure at home for 1 or more weeks. TREATMENT Treating high blood pressure includes making lifestyle changes and possibly taking medicine. Living a healthy lifestyle can help lower high blood pressure. You may need to change some of your habits. Lifestyle changes may include:  Following the DASH diet. This diet is high in fruits, vegetables, and whole grains. It is low in salt, red meat, and added sugars.  Keep your sodium intake below 2,300 mg per day.  Getting at least 30-45 minutes of aerobic exercise at least 4 times per week.  Losing weight if necessary.  Not smoking.  Limiting alcoholic beverages.  Learning ways to reduce stress. Your health care provider may prescribe medicine if lifestyle changes are not enough to get your blood pressure under control, and if one of the following is true:  You are 53-51 years of age and your systolic blood pressure is above 140.  You are 3 years of age or older,  and your systolic blood pressure is above 150.  Your diastolic blood pressure is above 90.  You have diabetes, and your systolic blood pressure is over [id number] or your diastolic blood pressure is over 90.  You have kidney disease and your blood pressure is above 140/90.  You have heart disease and your blood pressure  is above 140/90. Your personal target blood pressure may vary depending on your medical conditions, your age, and other factors. HOME CARE INSTRUCTIONS  Have your blood pressure rechecked as directed by your health care provider.   Take medicines only as directed by your health care provider. Follow the directions carefully. Blood pressure medicines must be taken as prescribed. The medicine does not work as well when you skip doses. Skipping doses also puts you at risk for problems.  Do not smoke.   Monitor your blood pressure at home as directed by your health care provider. SEEK MEDICAL CARE IF:   You think you are having a reaction to medicines taken.  You have recurrent headaches or feel dizzy.  You have swelling in your ankles.  You have trouble with your vision. SEEK IMMEDIATE MEDICAL CARE IF:  You develop a severe headache or confusion.  You have unusual weakness, numbness, or feel faint.  You have severe chest or abdominal pain.  You vomit repeatedly.  You have trouble breathing. MAKE SURE YOU:   Understand these instructions.  Will watch your condition.  Will get help right away if you are not doing well or get worse.   This information is not intended to replace advice given to you by your health care provider. Make sure you discuss any questions you have with your health care provider.   Document Released: 04/23/2005 Document Revised: 09/07/2014 Document Reviewed: 02/13/2013 Elsevier Interactive Patient Education 2016 [place] DASH stands for "Dietary Approaches to Stop Hypertension." The DASH eating plan is a healthy eating plan that has been shown to reduce high blood pressure (hypertension). Additional health benefits may include reducing the risk of type 2 diabetes mellitus, heart disease, and stroke. The DASH eating plan may also help with weight loss. WHAT DO I NEED TO KNOW ABOUT THE DASH EATING PLAN? For the DASH eating plan,  you will follow these general guidelines:  Choose foods with a percent daily value for sodium of less than 5% (as listed on the food label).  Use salt-free seasonings or herbs instead of table salt or sea salt.  Check with your health care provider or pharmacist before using salt substitutes.  Eat lower-sodium products, often labeled as "lower sodium" or "no salt added."  Eat fresh foods.  Eat more vegetables, fruits, and low-fat dairy products.  Choose whole grains. Look for the word "whole" as the first word in the ingredient list.  Choose fish and skinless chicken or [place] more often than red meat. Limit fish, poultry, and meat to 6 oz (170 g) each day.  Limit sweets, desserts, sugars, and sugary drinks.  Choose heart-healthy fats.  Limit cheese to 1 oz (28 g) per day.  Eat more home-cooked food and less restaurant, buffet, and fast food.  Limit fried foods.  Cook foods using methods other than frying.  Limit canned vegetables. If you do use them, rinse them well to decrease the sodium.  When eating at a restaurant, ask that your food be prepared with less salt, or no salt if possible. WHAT FOODS CAN I EAT? Seek help from a dietitian for individual  calorie needs. Grains Whole grain or whole wheat bread. Brown rice. Whole grain or whole wheat pasta. Quinoa, bulgur, and whole grain cereals. Low-sodium cereals. Corn or whole wheat flour tortillas. Whole grain cornbread. Whole grain crackers. Low-sodium crackers. Vegetables Fresh or frozen vegetables (raw, steamed, roasted, or grilled). Low-sodium or reduced-sodium tomato and vegetable juices. Low-sodium or reduced-sodium tomato sauce and paste. Low-sodium or reduced-sodium canned vegetables.  Fruits All fresh, canned (in natural juice), or frozen fruits. Meat and Other Protein Products Ground beef (85% or leaner), grass-fed beef, or beef trimmed of fat. Skinless chicken or [place]. Ground chicken or [place]. Pork trimmed of  fat. All fish and seafood. Eggs. Dried beans, peas, or lentils. Unsalted nuts and seeds. Unsalted canned beans. Dairy Low-fat dairy products, such as skim or 1% milk, 2% or reduced-fat cheeses, low-fat ricotta or cottage cheese, or plain low-fat yogurt. Low-sodium or reduced-sodium cheeses. Fats and Oils Tub margarines without trans fats. Light or reduced-fat mayonnaise and salad dressings (reduced sodium). Avocado. Safflower, olive, or canola oils. Natural peanut or almond butter. Other Unsalted popcorn and pretzels. The items listed above may not be a complete list of recommended foods or beverages. Contact your dietitian for more options. WHAT FOODS ARE NOT RECOMMENDED? Grains White bread. White pasta. White rice. Refined cornbread. Bagels and croissants. Crackers that contain trans fat. Vegetables Creamed or fried vegetables. Vegetables in a cheese sauce. Regular canned vegetables. Regular canned tomato sauce and paste. Regular tomato and vegetable juices. Fruits Dried fruits. Canned fruit in light or heavy syrup. Fruit juice. Meat and Other Protein Products Fatty cuts of meat. Ribs, chicken wings, bacon, sausage, bologna, salami, chitterlings, fatback, hot dogs, bratwurst, and packaged luncheon meats. Salted nuts and seeds. Canned beans with salt. Dairy Whole or 2% milk, cream, half-and-half, and cream cheese. Whole-fat or sweetened yogurt. Full-fat cheeses or blue cheese. Nondairy creamers and whipped toppings. Processed cheese, cheese spreads, or cheese curds. Condiments Onion and garlic salt, seasoned salt, table salt, and sea salt. Canned and packaged gravies. Worcestershire sauce. Tartar sauce. Barbecue sauce. Teriyaki sauce. Soy sauce, including reduced sodium. Steak sauce. Fish sauce. Oyster sauce. Cocktail sauce. Horseradish. Ketchup and mustard. Meat flavorings and tenderizers. Bouillon cubes. Hot sauce. Tabasco sauce. Marinades. Taco seasonings. Relishes. Fats and Oils Butter,  stick margarine, lard, shortening, ghee, and bacon fat. Coconut, palm kernel, or palm oils. Regular salad dressings. Other Pickles and olives. Salted popcorn and pretzels. The items listed above may not be a complete list of foods and beverages to avoid. Contact your dietitian for more information. WHERE CAN I FIND MORE INFORMATION? National Heart, Lung, and Blood Institute: travelstabloid.com   This information is not intended to replace advice given to you by your health care provider. Make sure you discuss any questions you have with your health care provider.   Document Released: 04/12/2011 Document Revised: 05/14/2014 Document Reviewed: 02/25/2013 Elsevier Interactive Patient Education [employer].

## 2015-09-19 NOTE — Progress Notes (Signed)
Subjective:    Patient ID: Raymond Long, male    DOB: [birth year], 55 y.o.   MRN: [id number]  Chief Complaint  Patient presents with  . Establish Care    hypertension    HPI:  Raymond Long is a 55 y.o. male who  has a past medical history of Hyperlipidemia; Cancer ([place]); Gout; Erectile dysfunction; Arthralgia of wrist, left; Insomnia; and PONV (postoperative nausea and vomiting). and presents today for a follow up office visit.   1.) Hypertension -  This is a new problem. Associated symptoms of flushed feeling and ringing in the ears has been going on for several months and was noted to have elevated blood pressures at home 160/102. Has not been diagnosed with hypertension. Not currently on any medication. Describes occasional headaches and may have some chest pain when lying on his back that lasts about 10-15 minutes. Chest pain is described as sharp on occasion. Relieved by sitting up or sitting on his side.  BP Readings from Last 3 Encounters:  09/19/15 160/102  09/09/15 148/102  08/25/15 160/93    Allergies  Allergen Reactions  . Atorvastatin   . Citalopram   . Ezetimibe-Simvastatin     REACTION: up cpk  . Fluoxetine Hcl   . Rosuvastatin     REACTION: up cpk  . Venlafaxine      Current Outpatient Prescriptions on File Prior to Visit  Medication Sig Dispense Refill  . beta carotene 25000 UNIT capsule Take 25,000 Units by mouth daily.    . budesonide-formoterol (SYMBICORT) 160-4.5 MCG/ACT inhaler Inhale 2 puffs into the lungs 2 (two) times daily. 1 Inhaler 3  . fluorouracil (EFUDEX) 5 % cream Apply 1 drop topically 2 (two) times daily.    . hydrOXYzine (ATARAX/VISTARIL) 25 MG tablet Take 25 mg by mouth at bedtime.    Marland Kitchen ibuprofen (ADVIL,MOTRIN) 800 MG tablet Take 800 mg by mouth every 8 (eight) hours as needed for pain.    . Melatonin 3 MG CAPS Take by mouth at bedtime.      . Multiple Vitamins-Minerals (MULTIVITAMIN WITH MINERALS) tablet Take 1 tablet by mouth  daily.    . sildenafil (VIAGRA) 100 MG tablet Take 1 tablet (100 mg total) by mouth daily as needed. 6 tablet 5  . vitamin C (ASCORBIC ACID) 500 MG tablet Take 500 mg by mouth daily.    . vitamin E 400 UNIT capsule Take 400 Units by mouth daily.     No current facility-administered medications on file prior to visit.     Past Surgical History  Procedure Laterality Date  . Tonsillectomy  age 2  . Hernia repair  2008    Bilateral  . Colonoscopy      @ 49 due to rectal symptoms  . Melanoma excision with sentinel lymph node biopsy Left 01/15/2013    Procedure: EXCISION LEFT LEG MELANOMA WITH LEFT INGUINAL SENTINEL NODE BIOPSY;  Surgeon: Rolm Bookbinder, MD;  Location: [facility];  Service: General;  Laterality: Left;     Review of Systems  Constitutional: Negative for fever and chills.  Eyes:       Negative for changes in vision  Respiratory: Negative for cough, chest tightness and wheezing.   Cardiovascular: Negative for chest pain, palpitations and leg swelling.  Neurological: Negative for dizziness, weakness and light-headedness.      Objective:    BP 160/102 mmHg  Pulse 81  Temp(Src) 97.7 F (36.5 C) (Oral)  Resp 14  Ht 6' (1.829  m)  Wt 196 lb (88.905 kg)  BMI 26.58 kg/m2  SpO2 94% Nursing note and vital signs reviewed.  Physical Exam  Constitutional: He is oriented to person, place, and time. He appears well-developed and well-nourished. No distress.  Cardiovascular: Normal rate, regular rhythm, normal heart sounds and intact distal pulses.   Pulmonary/Chest: Effort normal and breath sounds normal.  Neurological: He is alert and oriented to person, place, and time.  Skin: Skin is warm and dry.  Psychiatric: He has a normal mood and affect. His behavior is normal. Judgment and thought content normal.       Assessment & Plan:   Problem List Items Addressed This Visit      Cardiovascular and Mediastinum   Essential hypertension - Primary     Newly diagnosed with hypertension with multiple blood pressure readings greater than 140/90 with the associated symptoms of headaches and occasional ear ringing. Start Benicar. In office ECG shows normal sinus rhythm. Continue to monitor blood pressure at home. Follow up in 2 weeks for blood pressure check or sooner if symptoms worsen or worst headache of life develops.       Relevant Medications   olmesartan (BENICAR) 20 MG tablet   Other Relevant Orders   EKG 12-Lead (Completed)       I have discontinued Mr. Peil's predniSONE. I am also having him start on olmesartan. Additionally, I am having him maintain his Melatonin, hydrOXYzine, sildenafil, multivitamin with minerals, vitamin C, vitamin E, beta carotene, ibuprofen, fluorouracil, and budesonide-formoterol.   Meds ordered this encounter  Medications  . olmesartan (BENICAR) 20 MG tablet    Sig: Take 1 tablet (20 mg total) by mouth daily.    Dispense:  30 tablet    Refill:  1    Order Specific Question:  Supervising Provider    Answer:  Pricilla Holm A [id number]     Follow-up: Return in about 2 weeks (around 10/03/2015) for Blood pressure check - Nurse visit. Mauricio Po, FNP

## 2015-09-19 NOTE — Assessment & Plan Note (Signed)
Newly diagnosed with hypertension with multiple blood pressure readings greater than 140/90 with the associated symptoms of headaches and occasional ear ringing. Start Benicar. In office ECG shows normal sinus rhythm. Continue to monitor blood pressure at home. Follow up in 2 weeks for blood pressure check or sooner if symptoms worsen or worst headache of life develops.

## 2015-09-19 NOTE — Progress Notes (Signed)
Pre visit review using our clinic review tool, if applicable. No additional management support is needed unless otherwise documented below in the visit note. 

## 2015-10-05 ENCOUNTER — Ambulatory Visit: Payer: Managed Care, Other (non HMO) | Admitting: Geriatric Medicine

## 2015-10-05 VITALS — BP 136/92

## 2015-10-05 DIAGNOSIS — I10 Essential (primary) hypertension: Secondary | ICD-10-CM

## 2015-10-12 ENCOUNTER — Encounter: Payer: Self-pay | Admitting: Family

## 2015-10-12 ENCOUNTER — Other Ambulatory Visit (INDEPENDENT_AMBULATORY_CARE_PROVIDER_SITE_OTHER): Payer: Managed Care, Other (non HMO)

## 2015-10-12 ENCOUNTER — Ambulatory Visit (INDEPENDENT_AMBULATORY_CARE_PROVIDER_SITE_OTHER): Payer: Managed Care, Other (non HMO) | Admitting: Family

## 2015-10-12 VITALS — BP 112/82 | HR 63 | Temp 97.7°F | Resp 16 | Ht 72.0 in | Wt 197.0 lb

## 2015-10-12 DIAGNOSIS — Z0000 Encounter for general adult medical examination without abnormal findings: Secondary | ICD-10-CM

## 2015-10-12 DIAGNOSIS — E785 Hyperlipidemia, unspecified: Secondary | ICD-10-CM

## 2015-10-12 LAB — COMPREHENSIVE METABOLIC PANEL
ALBUMIN: 4.5 g/dL (ref 3.5–5.2)
ALT: 22 U/L (ref 0–53)
AST: 14 U/L (ref 0–37)
Alkaline Phosphatase: 68 U/L (ref 39–117)
BUN: 17 mg/dL (ref 6–23)
CALCIUM: 9.3 mg/dL (ref 8.4–10.5)
CHLORIDE: 104 meq/L (ref 96–112)
CO2: 28 mEq/L (ref 19–32)
Creatinine, Ser: 1.32 mg/dL (ref 0.40–1.50)
GFR: 59.77 mL/min — AB (ref >60.00)
Glucose, Bld: 93 mg/dL (ref 70–99)
POTASSIUM: 4.5 meq/L (ref 3.5–5.1)
Sodium: 139 mEq/L (ref 135–145)
TOTAL PROTEIN: 6.6 g/dL (ref 6.0–8.3)
Total Bilirubin: 0.6 mg/dL (ref 0.2–1.2)

## 2015-10-12 LAB — LIPID PANEL
CHOLESTEROL: 283 mg/dL — AB (ref 0–200)
HDL: 33.1 mg/dL — AB (ref >39.00)
LDL Cholesterol: 212 mg/dL — ABNORMAL HIGH (ref 0–99)
NonHDL: 249.65
Total CHOL/HDL Ratio: 9
Triglycerides: 187 mg/dL — ABNORMAL HIGH (ref 0.0–149.0)
VLDL: 37.4 mg/dL (ref 0.0–40.0)

## 2015-10-12 LAB — CBC
HEMATOCRIT: 45.2 % (ref 39.0–52.0)
HEMOGLOBIN: 15.6 g/dL (ref 13.0–17.0)
MCHC: 34.5 g/dL (ref 30.0–36.0)
MCV: 91.3 fl (ref 78.0–100.0)
Platelets: 154 10*3/uL (ref 150.0–400.0)
RBC: 4.95 Mil/uL (ref 4.22–5.81)
RDW: 13.9 % (ref 11.5–15.5)
WBC: 5.4 10*3/uL (ref 4.0–10.5)

## 2015-10-12 LAB — PSA: PSA: 1.95 ng/mL (ref 0.10–4.00)

## 2015-10-12 NOTE — Progress Notes (Signed)
Pre visit review using our clinic review tool, if applicable. No additional management support is needed unless otherwise documented below in the visit note. 

## 2015-10-12 NOTE — Patient Instructions (Signed)
Thank you for choosing [facility].  Summary/Instructions:  Please stop by the lab on the basement level of the building for your blood work. Your results will be released to MyChart (or called to you) after review, usually within 72 hours after test completion. If any changes need to be made, you will be notified at that same time.  Health Maintenance, Male A healthy lifestyle and preventative care can promote health and wellness.  Maintain regular health, dental, and eye exams.  Eat a healthy diet. Foods like vegetables, fruits, whole grains, low-fat dairy products, and lean protein foods contain the nutrients you need and are low in calories. Decrease your intake of foods high in solid fats, added sugars, and salt. Get information about a proper diet from your health care provider, if necessary.  Regular physical exercise is one of the most important things you can do for your health. Most adults should get at least 150 minutes of moderate-intensity exercise (any activity that increases your heart rate and causes you to sweat) each week. In addition, most adults need muscle-strengthening exercises on 2 or more days a week.   Maintain a healthy weight. The body mass index (BMI) is a screening tool to identify possible weight problems. It provides an estimate of body fat based on height and weight. Your health care provider can find your BMI and can help you achieve or maintain a healthy weight. For males 20 years and older:  A BMI below 18.5 is considered underweight.  A BMI of 18.5 to 24.9 is normal.  A BMI of 25 to 29.9 is considered overweight.  A BMI of 30 and above is considered obese.  Maintain normal blood lipids and cholesterol by exercising and minimizing your intake of saturated fat. Eat a balanced diet with plenty of fruits and vegetables. Blood tests for lipids and cholesterol should begin at age 20 and be repeated every 5 years. If your lipid or cholesterol levels are  high, you are over age 50, or you are at high risk for heart disease, you may need your cholesterol levels checked more frequently.Ongoing high lipid and cholesterol levels should be treated with medicines if diet and exercise are not working.  If you smoke, find out from your health care provider how to quit. If you do not use tobacco, do not start.  Lung cancer screening is recommended for adults aged 55-80 years who are at high risk for developing lung cancer because of a history of smoking. A yearly low-dose CT scan of the lungs is recommended for people who have at least a 30-pack-year history of smoking and are current smokers or have quit within the past 15 years. A pack year of smoking is smoking an average of 1 pack of cigarettes a day for 1 year (for example, a 30-pack-year history of smoking could mean smoking 1 pack a day for 30 years or 2 packs a day for 15 years). Yearly screening should continue until the smoker has stopped smoking for at least 15 years. Yearly screening should be stopped for people who develop a health problem that would prevent them from having lung cancer treatment.  If you choose to drink alcohol, do not have more than 2 drinks per day. One drink is considered to be 12 oz (360 mL) of beer, 5 oz (150 mL) of wine, or 1.5 oz (45 mL) of liquor.  Avoid the use of street drugs. Do not share needles with anyone. Ask for help if you need   support or instructions about stopping the use of drugs.  High blood pressure causes heart disease and increases the risk of stroke. High blood pressure is more likely to develop in:  People who have blood pressure in the end of the normal range (100-139/85-89 mm Hg).  People who are overweight or obese.  People who are African American.  If you are 18-39 years of age, have your blood pressure checked every 3-5 years. If you are 40 years of age or older, have your blood pressure checked every year. You should have your blood pressure  measured twice--once when you are at a hospital or clinic, and once when you are not at a hospital or clinic. Record the average of the two measurements. To check your blood pressure when you are not at a hospital or clinic, you can use:  An automated blood pressure machine at a pharmacy.  A home blood pressure monitor.  If you are 45-79 years old, ask your health care provider if you should take aspirin to prevent heart disease.  Diabetes screening involves taking a blood sample to check your fasting blood sugar level. This should be done once every 3 years after age 45 if you are at a normal weight and without risk factors for diabetes. Testing should be considered at a younger age or be carried out more frequently if you are overweight and have at least 1 risk factor for diabetes.  Colorectal cancer can be detected and often prevented. Most routine colorectal cancer screening begins at the age of 50 and continues through age 75. However, your health care provider may recommend screening at an earlier age if you have risk factors for colon cancer. On a yearly basis, your health care provider may provide home test kits to check for hidden blood in the stool. A small camera at the end of a tube may be used to directly examine the colon (sigmoidoscopy or colonoscopy) to detect the earliest forms of colorectal cancer. Talk to your health care provider about this at age 50 when routine screening begins. A direct exam of the colon should be repeated every 5-10 years through age 75, unless early forms of precancerous polyps or small growths are found.  People who are at an increased risk for hepatitis B should be screened for this virus. You are considered at high risk for hepatitis B if:  You were born in a country where hepatitis B occurs often. Talk with your health care provider about which countries are considered high risk.  Your parents were born in a high-risk country and you have not received a  shot to protect against hepatitis B (hepatitis B vaccine).  You have HIV or AIDS.  You use needles to inject street drugs.  You live with, or have sex with, someone who has hepatitis B.  You are a man who has sex with other men (MSM).  You get hemodialysis treatment.  You take certain medicines for conditions like cancer, organ transplantation, and autoimmune conditions.  Hepatitis C blood testing is recommended for all people born from 1945 through 1965 and any individual with known risk factors for hepatitis C.  Healthy men should no longer receive prostate-specific antigen (PSA) blood tests as part of routine cancer screening. Talk to your health care provider about prostate cancer screening.  Testicular cancer screening is not recommended for adolescents or adult males who have no symptoms. Screening includes self-exam, a health care provider exam, and other screening tests. Consult with your   health care provider about any symptoms you have or any concerns you have about testicular cancer.  Practice safe sex. Use condoms and avoid high-risk sexual practices to reduce the spread of sexually transmitted infections (STIs).  You should be screened for STIs, including gonorrhea and chlamydia if:  You are sexually active and are younger than 24 years.  You are older than 24 years, and your health care provider tells you that you are at risk for this type of infection.  Your sexual activity has changed since you were last screened, and you are at an increased risk for chlamydia or gonorrhea. Ask your health care provider if you are at risk.  If you are at risk of being infected with HIV, it is recommended that you take a prescription medicine daily to prevent HIV infection. This is called pre-exposure prophylaxis (PrEP). You are considered at risk if:  You are a man who has sex with other men (MSM).  You are a heterosexual man who is sexually active with multiple partners.  You take  drugs by injection.  You are sexually active with a partner who has HIV.  Talk with your health care provider about whether you are at high risk of being infected with HIV. If you choose to begin PrEP, you should first be tested for HIV. You should then be tested every 3 months for as long as you are taking PrEP.  Use sunscreen. Apply sunscreen liberally and repeatedly throughout the day. You should seek shade when your shadow is shorter than you. Protect yourself by wearing long sleeves, pants, a wide-brimmed hat, and sunglasses year round whenever you are outdoors.  Tell your health care provider of new moles or changes in moles, especially if there is a change in shape or color. Also, tell your health care provider if a mole is larger than the size of a pencil eraser.  A one-time screening for abdominal aortic aneurysm (AAA) and surgical repair of large AAAs by ultrasound is recommended for men aged 65-75 years who are current or former smokers.  Stay current with your vaccines (immunizations).   This information is not intended to replace advice given to you by your health care provider. Make sure you discuss any questions you have with your health care provider.   Document Released: 10/20/2007 Document Revised: 05/14/2014 Document Reviewed: 09/18/2010 Elsevier Interactive Patient Education 2016 [employer]  

## 2015-10-12 NOTE — Assessment & Plan Note (Signed)
1) Anticipatory Guidance: Discussed importance of wearing a seatbelt while driving and not texting while driving; changing batteries in smoke detector at least once annually; wearing suntan lotion when outside; eating a balanced and moderate diet; getting physical activity at least 30 minutes per day.  2) Immunizations / Screenings / Labs:  Declines tetanus. All other immunizations are up to date per recommendations. Obtain Hepatitis C antibody for Hepatitis C screening. Obtain PSA for prostate cancer screening. All other screenings are up to date per recommendations. Obtain CBC, CMET, and  Lipid profile.  Overall well exam with risk factors for cardiovascular disease including hypertension. Hypertension is well controlled with current regimen. BMI indicates overweight, however based on physical exam he is of good weight, eats well and exercises regularly. Encouraged to decrease caffeine intake. Continue other healthy lifestyle behaviors and choices. Follow up prevention exam in 1 year with follow up office visit for chronic conditions.

## 2015-10-12 NOTE — Progress Notes (Signed)
Subjective:    Patient ID: Raymond Long, male    DOB: 1960/12/23, 55 y.o.   MRN: [id number]  Chief Complaint  Patient presents with  . CPE    fasting    HPI:  Raymond Long is a 55 y.o. male who presents today for an annual wellness visit.   1) Health Maintenance -   Diet - Averages about 4 meals per day consisting of fruits, vegetables, legumes, eggs, fish, and occasional red meat and pork; 8-10 cups of caffeine daily between coffee and tea  Exercise - Has been been running and goes to the gym 3x per week    2) Preventative Exams / Immunizations:  Dental -- Up to date  Vision -- Up to date   Health Maintenance  Topic Date Due  . Hepatitis C Screening  [birth year]  . HIV Screening  05/23/1975  . TETANUS/TDAP  01/20/2016 (Originally 05/23/1979)  . INFLUENZA VACCINE  12/06/2015  . COLONOSCOPY  04/07/2019     There is no immunization history on file for this patient.  Allergies  Allergen Reactions  . Atorvastatin   . Citalopram   . Ezetimibe-Simvastatin     REACTION: up cpk  . Fluoxetine Hcl   . Rosuvastatin     REACTION: up cpk  . Venlafaxine      Outpatient Prescriptions Prior to Visit  Medication Sig Dispense Refill  . beta carotene 25000 UNIT capsule Take 25,000 Units by mouth daily.    . budesonide-formoterol (SYMBICORT) 160-4.5 MCG/ACT inhaler Inhale 2 puffs into the lungs 2 (two) times daily. 1 Inhaler 3  . fluorouracil (EFUDEX) 5 % cream Apply 1 drop topically 2 (two) times daily.    . hydrOXYzine (ATARAX/VISTARIL) 25 MG tablet Take 25 mg by mouth at bedtime.    Marland Kitchen ibuprofen (ADVIL,MOTRIN) 800 MG tablet Take 800 mg by mouth every 8 (eight) hours as needed for pain.    . Melatonin 3 MG CAPS Take by mouth at bedtime.      . Multiple Vitamins-Minerals (MULTIVITAMIN WITH MINERALS) tablet Take 1 tablet by mouth daily.    Marland Kitchen olmesartan (BENICAR) 20 MG tablet Take 1 tablet (20 mg total) by mouth daily. 30 tablet 1  . sildenafil (VIAGRA) 100 MG tablet  Take 1 tablet (100 mg total) by mouth daily as needed. 6 tablet 5  . vitamin C (ASCORBIC ACID) 500 MG tablet Take 500 mg by mouth daily.    . vitamin E 400 UNIT capsule Take 400 Units by mouth daily.     No facility-administered medications prior to visit.     Past Medical History  Diagnosis Date  . Hyperlipidemia   . Cancer (HCC)     Skin  . Gout     Foot  . Erectile dysfunction   . Arthralgia of wrist, left   . Insomnia   . PONV (postoperative nausea and vomiting)      Past Surgical History  Procedure Laterality Date  . Tonsillectomy  age 23  . Hernia repair  2008    Bilateral  . Colonoscopy      @ 49 due to rectal symptoms  . Melanoma excision with sentinel lymph node biopsy Left 01/15/2013    Procedure: EXCISION LEFT LEG MELANOMA WITH LEFT INGUINAL SENTINEL NODE BIOPSY;  Surgeon: Rolm Bookbinder, MD;  Location: [facility];  Service: General;  Laterality: Left;     Family History  Problem Relation Age of Onset  . Heart attack Father 49  . Heart  attack Mother     < 64  . Dementia Mother      Social History   Social History  . Marital Status: Married    Spouse Name: N/A  . Number of Children: N/A  . Years of Education: N/A   Occupational History  . Not on file.   Social History Main Topics  . Smoking status: Former Smoker    Quit date: 01/09/2001  . Smokeless tobacco: Not on file     Comment: 1978-2002, up to 2 ppd  . Alcohol Use: No     Comment:  in AA  . Drug Use: No  . Sexual Activity: Not on file   Other Topics Concern  . Not on file   Social History Narrative    Review of Systems  Constitutional: Denies fever, chills, fatigue, or significant weight gain/loss. HENT: Head: Denies headache or neck pain Ears: Denies changes in hearing, ringing in ears, earache, drainage Nose: Denies discharge, stuffiness, itching, nosebleed, sinus pain Throat: Denies sore throat, hoarseness, dry mouth, sores, thrush Eyes: Denies  loss/changes in vision, pain, redness, blurry/double vision, flashing lights Cardiovascular: Denies chest pain/discomfort, tightness, palpitations, shortness of breath with activity, difficulty lying down, swelling, sudden awakening with shortness of breath Respiratory: Denies shortness of breath, cough, sputum production, wheezing Gastrointestinal: Denies dysphasia, heartburn, change in appetite, nausea, change in bowel habits, rectal bleeding, constipation, diarrhea, yellow skin or eyes Genitourinary: Denies frequency, urgency, burning/pain, blood in urine, incontinence, change in urinary strength. Musculoskeletal: Denies muscle/joint pain, stiffness, back pain, redness or swelling of joints, trauma Skin: Denies rashes, lumps, itching, dryness, color changes, or hair/nail changes Neurological: Denies dizziness, fainting, seizures, weakness, numbness, tingling, tremor Psychiatric - Denies nervousness, stress, depression or memory loss Endocrine: Denies heat or cold intolerance, sweating, frequent urination, excessive thirst, changes in appetite Hematologic: Denies ease of bruising or bleeding     Objective:     BP 112/82 mmHg  Pulse 63  Temp(Src) 97.7 F (36.5 C) (Oral)  Resp 16  Ht 6' (1.829 m)  Wt 197 lb (89.359 kg)  BMI 26.71 kg/m2  SpO2 98% Nursing note and vital signs reviewed.  Physical Exam  Constitutional: He is oriented to person, place, and time. He appears well-developed and well-nourished. No distress.  HENT:  Head: Normocephalic.  Right Ear: Hearing, tympanic membrane, external ear and ear canal normal.  Left Ear: Hearing, tympanic membrane, external ear and ear canal normal.  Nose: Nose normal.  Mouth/Throat: Uvula is midline, oropharynx is clear and moist and mucous membranes are normal.  Eyes: Conjunctivae and EOM are normal. Pupils are equal, round, and reactive to light.  Neck: Neck supple. No JVD present. No tracheal deviation present. No thyromegaly present.    Cardiovascular: Normal rate, regular rhythm, normal heart sounds and intact distal pulses.   Pulmonary/Chest: Effort normal and breath sounds normal.  Abdominal: Soft. Bowel sounds are normal. He exhibits no distension and no mass. There is no tenderness. There is no rebound and no guarding.  Musculoskeletal: Normal range of motion. He exhibits no edema or tenderness.  Lymphadenopathy:    He has no cervical adenopathy.  Neurological: He is alert and oriented to person, place, and time. He has normal reflexes. No cranial nerve deficit. He exhibits normal muscle tone. Coordination normal.  Skin: Skin is warm and dry.  Psychiatric: He has a normal mood and affect. His behavior is normal. Judgment and thought content normal.       Assessment & Plan:   Problem List  Items Addressed This Visit      Other   Routine general medical examination at a health care facility - Primary    1) Anticipatory Guidance: Discussed importance of wearing a seatbelt while driving and not texting while driving; changing batteries in smoke detector at least once annually; wearing suntan lotion when outside; eating a balanced and moderate diet; getting physical activity at least 30 minutes per day.  2) Immunizations / Screenings / Labs:  Declines tetanus. All other immunizations are up to date per recommendations. Obtain Hepatitis C antibody for Hepatitis C screening. Obtain PSA for prostate cancer screening. All other screenings are up to date per recommendations. Obtain CBC, CMET, and  Lipid profile.  Overall well exam with risk factors for cardiovascular disease including hypertension. Hypertension is well controlled with current regimen. BMI indicates overweight, however based on physical exam he is of good weight, eats well and exercises regularly. Encouraged to decrease caffeine intake. Continue other healthy lifestyle behaviors and choices. Follow up prevention exam in 1 year with follow up office visit for  chronic conditions.         Relevant Orders   Hepatitis C antibody   Comprehensive metabolic panel   PSA   Lipid panel   CBC       I am having Mr. Ozaeta maintain his Melatonin, hydrOXYzine, sildenafil, multivitamin with minerals, vitamin C, vitamin E, beta carotene, ibuprofen, fluorouracil, budesonide-formoterol, and olmesartan.   Follow-up: 6 months or if symptoms worsen or fail to improve.   Mauricio Po, FNP

## 2015-10-13 LAB — HEPATITIS C ANTIBODY: HCV Ab: NEGATIVE

## 2015-10-16 ENCOUNTER — Encounter: Payer: Self-pay | Admitting: Family

## 2015-10-19 ENCOUNTER — Encounter: Payer: Self-pay | Admitting: Family

## 2015-11-02 ENCOUNTER — Ambulatory Visit (INDEPENDENT_AMBULATORY_CARE_PROVIDER_SITE_OTHER): Payer: Managed Care, Other (non HMO) | Admitting: Podiatry

## 2015-11-02 ENCOUNTER — Encounter: Payer: Self-pay | Admitting: Podiatry

## 2015-11-02 DIAGNOSIS — M7661 Achilles tendinitis, right leg: Secondary | ICD-10-CM | POA: Diagnosis not present

## 2015-11-02 DIAGNOSIS — M1000 Idiopathic gout, unspecified site: Secondary | ICD-10-CM

## 2015-11-02 MED ORDER — ALLOPURINOL 100 MG PO TABS: 100.0000 mg | ORAL_TABLET | Freq: Every day | ORAL | Status: DC

## 2015-11-02 MED ORDER — TRIAMCINOLONE ACETONIDE 10 MG/ML IJ SUSP
10.0000 mg | Freq: Once | INTRAMUSCULAR | Status: AC
  Administered 2015-11-02: 10 mg

## 2015-11-02 MED ORDER — PREDNISONE 10 MG PO TABS: ORAL_TABLET | ORAL | Status: DC

## 2015-11-03 NOTE — Progress Notes (Signed)
Subjective:     Patient ID: Raymond Long, male   DOB: 20-Dec-1960, 55 y.o.   MRN: [id number]  HPI patient presents stating I'm doing pretty good with my big toe joint and I have developed a lot of pain in the back of my heel and I'm concerned that this might be gout also. States it started all of a sudden about a week ago and has been intensely discomfort   Review of Systems     Objective:   Physical Exam Neurovascular status intact negative Homans sign was noted with exquisite discomfort in the posterior lateral aspect of the right heel with mild to moderate inflammation. The right big toe joint continues to exhibit significant arthritic issues but is not currently inflamed    Assessment:     Strong probability that this is related to gout and it has created a significant painful acute inflammatory process posterior heel right    Plan:     Explained this condition to him and at this point recommended careful sheath injection explaining chances for rupture associated with this. Patient is willing to accept risk and I carefully injected the posterior lateral Achilles with 3 mg dexamethasone Kenalog 5 mg Xylocaine and I advised him reduced activity. I then went ahead and I started him on a Medrol Dosepak 6 day and also we will start him on allopurinol to reduce his uric acid level. I want to see him back again in 2 months and we will reevaluate and then decide whether or not we need to keep him on medication. 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

## 2015-12-13 HISTORY — PX: MELANOMA EXCISION: SHX5266

## 2016-01-02 ENCOUNTER — Ambulatory Visit (INDEPENDENT_AMBULATORY_CARE_PROVIDER_SITE_OTHER): Payer: Managed Care, Other (non HMO) | Admitting: Podiatry

## 2016-01-02 ENCOUNTER — Encounter: Payer: Self-pay | Admitting: Podiatry

## 2016-01-02 DIAGNOSIS — M779 Enthesopathy, unspecified: Secondary | ICD-10-CM | POA: Diagnosis not present

## 2016-01-02 DIAGNOSIS — M1000 Idiopathic gout, unspecified site: Secondary | ICD-10-CM

## 2016-01-02 MED ORDER — COLCHICINE 0.6 MG PO TABS
0.6000 mg | ORAL_TABLET | Freq: Two times a day (BID) | ORAL | 2 refills | Status: DC
Start: 2016-01-02 — End: 2016-04-26

## 2016-01-03 ENCOUNTER — Other Ambulatory Visit: Payer: Self-pay | Admitting: Family

## 2016-01-03 DIAGNOSIS — I10 Essential (primary) hypertension: Secondary | ICD-10-CM

## 2016-01-03 NOTE — Progress Notes (Signed)
Subjective:     Patient ID: Raymond Long, male   DOB: August 26, 1960, 55 y.o.   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 He will be seen back if any issues should occur

## 2016-01-04 NOTE — Telephone Encounter (Signed)
When trying to send in medication a box popped up saying that this medication is not on formulary. It also had alternatives listed. Please advise

## 2016-01-05 ENCOUNTER — Telehealth: Payer: Self-pay | Admitting: *Deleted

## 2016-01-05 DIAGNOSIS — M10079 Idiopathic gout, unspecified ankle and foot: Secondary | ICD-10-CM

## 2016-01-05 NOTE — Telephone Encounter (Signed)
Faxed referral form, pt clinicals and demographics to [facility].

## 2016-02-06 IMAGING — CR DG LUMBAR SPINE COMPLETE 4+V
4 series · 4 of 4 positions shown · non-contrast
Comparison: None.

CLINICAL DATA: Back pain radiating into the left leg.

EXAM:
LUMBAR SPINE - COMPLETE 4+ VIEW

[AP (1 of 2)]
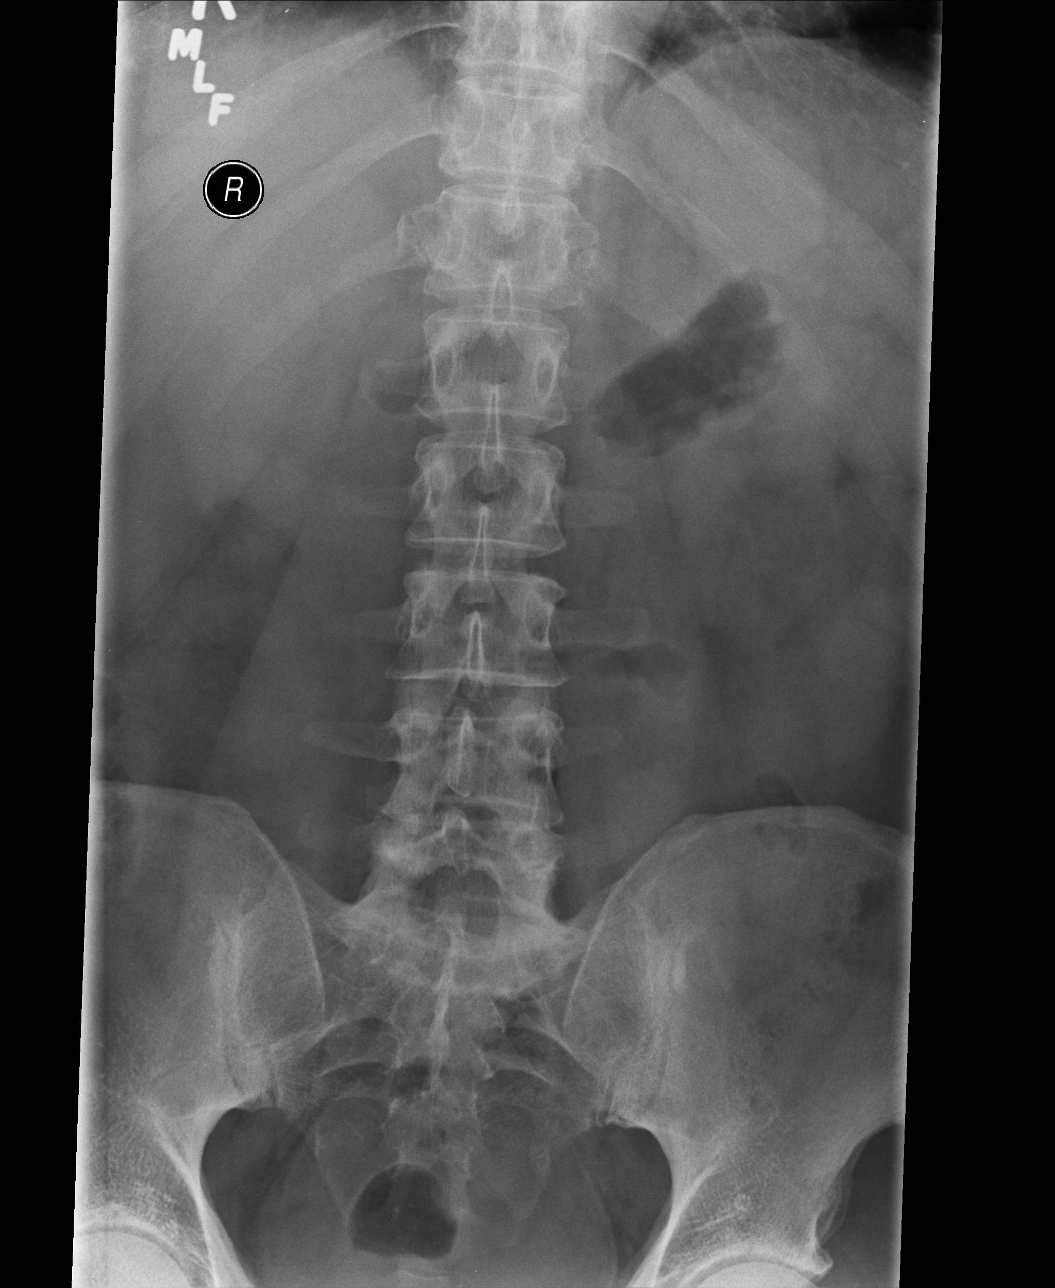

[rpo]
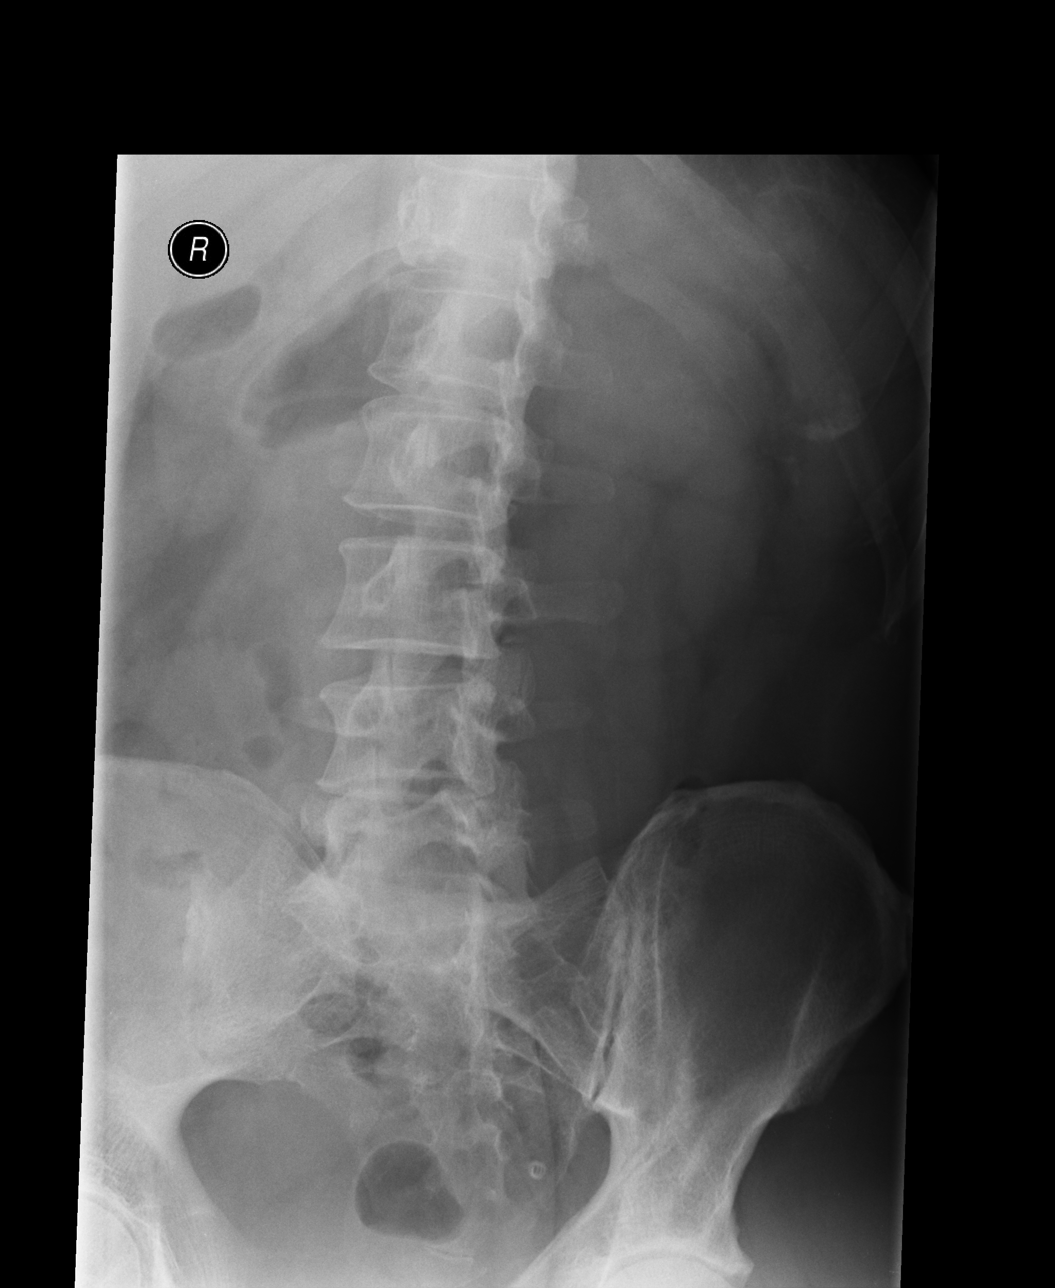

[lpo]
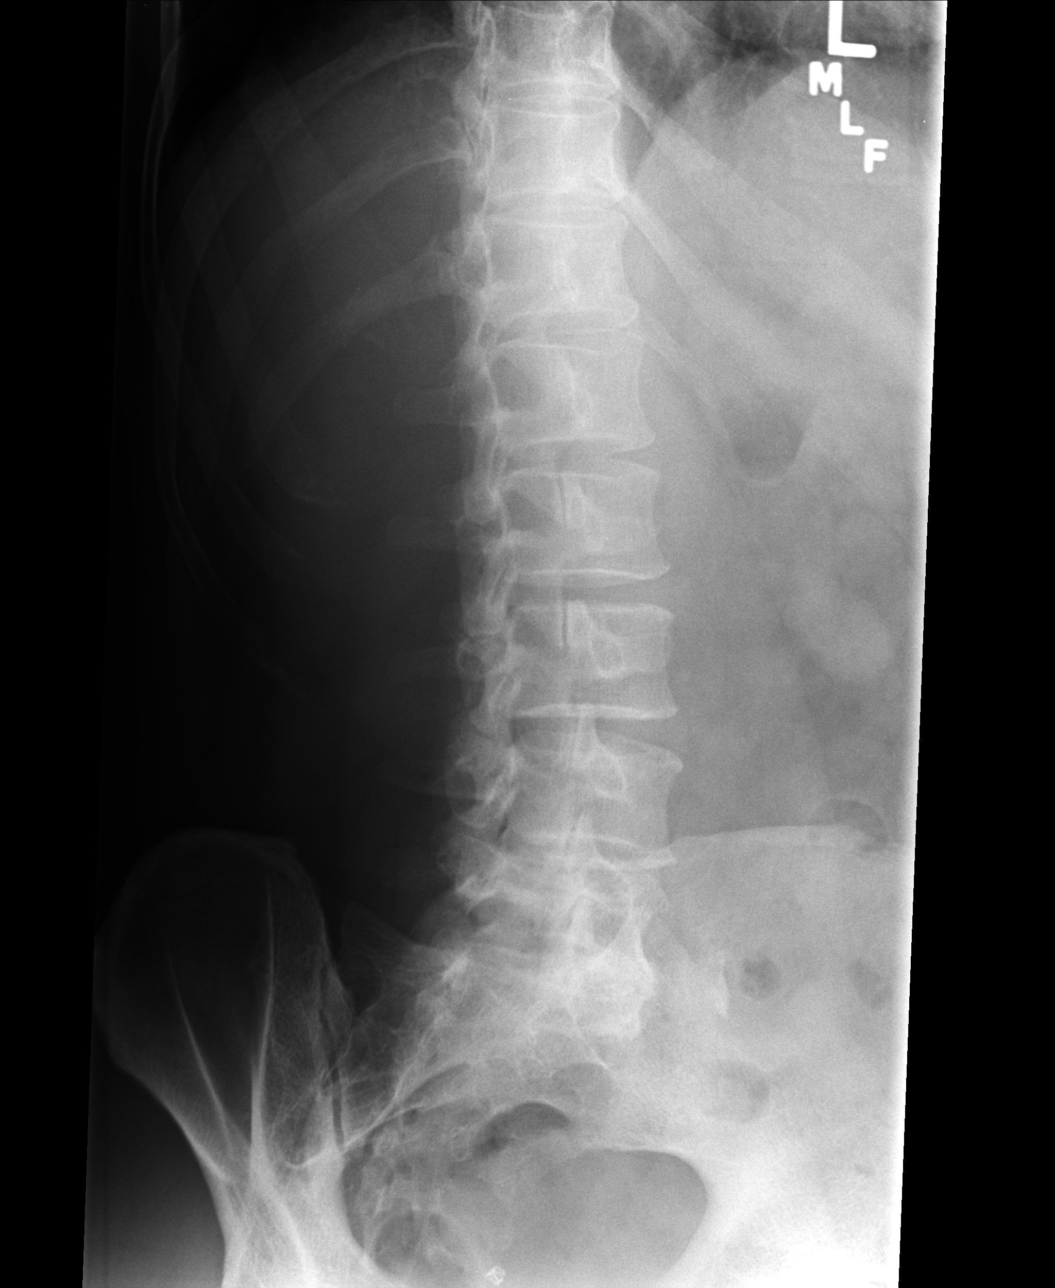

[AP (2 of 2)]
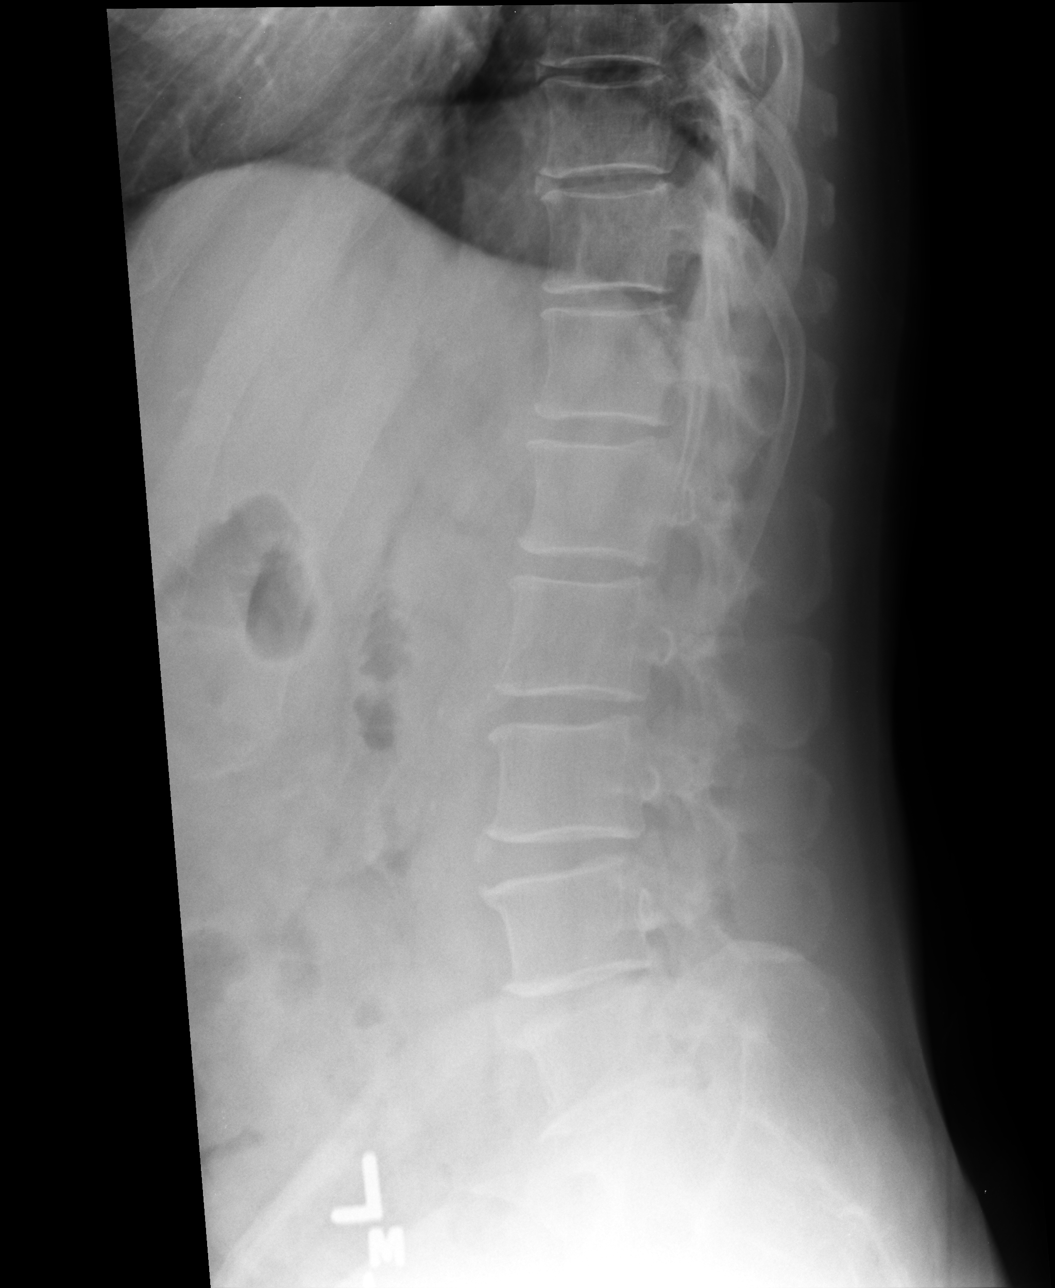

[4 of 4 positions shown; findings below may reference images not displayed]

FINDINGS: Vertebral body height and alignment are maintained. There is some
loss of disc space height at L4-5 and L5-S1. No pars
interarticularis defect is seen. Paraspinal structures are
unremarkable.
IMPRESSION: No acute finding.

Mild appearing degenerative disease L4-5 and L5-S1.

## 2016-04-26 ENCOUNTER — Encounter: Payer: Self-pay | Admitting: Family

## 2016-04-26 ENCOUNTER — Ambulatory Visit (INDEPENDENT_AMBULATORY_CARE_PROVIDER_SITE_OTHER): Payer: Managed Care, Other (non HMO) | Admitting: Family

## 2016-04-26 VITALS — BP 138/88 | HR 85 | Temp 98.9°F | Resp 16 | Ht 72.0 in | Wt 206.0 lb

## 2016-04-26 DIAGNOSIS — J069 Acute upper respiratory infection, unspecified: Secondary | ICD-10-CM

## 2016-04-26 DIAGNOSIS — N50812 Left testicular pain: Secondary | ICD-10-CM | POA: Diagnosis not present

## 2016-04-26 MED ORDER — HYDROCODONE-HOMATROPINE 5-1.5 MG/5ML PO SYRP: 5.0000 mL | ORAL_SOLUTION | Freq: Three times a day (TID) | ORAL | 0 refills | Status: DC | PRN

## 2016-04-26 NOTE — Patient Instructions (Signed)
Thank you for choosing [facility].  SUMMARY AND INSTRUCTIONS:  Medication:  Your prescription(s) have been submitted to your pharmacy or been printed and provided for you. Please take as directed and contact our office if you believe you are having problem(s) with the medication(s) or have any questions.  Follow up:  If your symptoms worsen or fail to improve, please contact our office for further instruction, or in case of emergency go directly to the emergency room at the closest medical facility.   General Recommendations:    Please drink plenty of fluids.  Get plenty of rest   Sleep in humidified air  Use saline nasal sprays  Netti pot   OTC Medications:  Decongestants - helps relieve congestion   Flonase (generic fluticasone) or Nasacort (generic triamcinolone) - please make sure to use the "cross-over" technique at a 45 degree angle towards the opposite eye as opposed to straight up the nasal passageway.   Sudafed (generic pseudoephedrine - Note this is the one that is available behind the pharmacy counter); Products with phenylephrine (-PE) may also be used but is often not as effective as pseudoephedrine.   If you have HIGH BLOOD PRESSURE - Coricidin HBP; AVOID any product that is -D as this contains pseudoephedrine which may increase your blood pressure.  Afrin (oxymetazoline) every 6-8 hours for up to 3 days.   Allergies - helps relieve runny nose, itchy eyes and sneezing   Claritin (generic loratidine), Allegra (fexofenidine), or Zyrtec (generic cyrterizine) for runny nose. These medications should not cause drowsiness.  Note - Benadryl (generic diphenhydramine) may be used however may cause drowsiness  Cough -   Delsym or Robitussin (generic dextromethorphan)  Expectorants - helps loosen mucus to ease removal   Mucinex (generic guaifenesin) as directed on the package.  Headaches / General Aches   Tylenol (generic acetaminophen) - DO NOT  EXCEED 3 grams (3,000 mg) in a 24 hour time period  Advil/Motrin (generic ibuprofen)   Sore Throat -   Salt water gargle   Chloraseptic (generic benzocaine) spray or lozenges / Sucrets (generic dyclonine)       Upper Respiratory Infection, Adult Most upper respiratory infections (URIs) are caused by a virus. A URI affects the nose, throat, and upper air passages. The most common type of URI is often called "the common cold." Follow these instructions at home:  Take medicines only as told by your doctor.  Gargle warm saltwater or take cough drops to comfort your throat as told by your doctor.  Use a warm mist humidifier or inhale steam from a shower to increase air moisture. This may make it easier to breathe.  Drink enough fluid to keep your pee (urine) clear or pale yellow.  Eat soups and other clear broths.  Have a healthy diet.  Rest as needed.  Go back to work when your fever is gone or your doctor says it is okay.  You may need to stay home longer to avoid giving your URI to others.  You can also wear a face mask and wash your hands often to prevent spread of the virus.  Use your inhaler more if you have asthma.  Do not use any tobacco products, including cigarettes, chewing tobacco, or electronic cigarettes. If you need help quitting, ask your doctor. Contact a doctor if:  You are getting worse, not better.  Your symptoms are not helped by medicine.  You have chills.  You are getting more short of breath.  You have brown  or red mucus.  You have yellow or brown discharge from your nose.  You have pain in your face, especially when you bend forward.  You have a fever.  You have puffy (swollen) neck glands.  You have pain while swallowing.  You have white areas in the back of your throat. Get help right away if:  You have very bad or constant:  Headache.  Ear pain.  Pain in your forehead, behind your eyes, and over your cheekbones (sinus  pain).  Chest pain.  You have long-lasting (chronic) lung disease and any of the following:  Wheezing.  Long-lasting cough.  Coughing up blood.  A change in your usual mucus.  You have a stiff neck.  You have changes in your:  Vision.  Hearing.  Thinking.  Mood. This information is not intended to replace advice given to you by your health care provider. Make sure you discuss any questions you have with your health care provider. Document Released: 10/10/2007 Document Revised: 12/25/2015 Document Reviewed: 07/29/2013 Elsevier Interactive Patient Education  2017 [employer].

## 2016-04-26 NOTE — Progress Notes (Signed)
Subjective:    Patient ID: Raymond Long, male    DOB: July 13, 1960, 55 y.o.   MRN: [id number]  Chief Complaint  Patient presents with  . Cough    drainage, cough, congestion x5 days    HPI:  Raymond Long is a 55 y.o. male who  has a past medical history of Arthralgia of wrist, left; Cancer ([place]); Erectile dysfunction; Gout; Hyperlipidemia; Insomnia; and PONV (postoperative nausea and vomiting). and presents today for an acute office visit.  1.) Cold symptoms - This is a new problem. Associated symptoms of drainage, cough, and congestion have been going on for about 5 days. May have had a subjective fever. Modifying factors include Mucinex which helped a little.  Severity of the cough is enough to keep him up at night. No recent antibiotics. Sick contacts in the family.   2.) Testicular pain - This is a new problem. Associated symptom of pain located in his left testicle has been going on for approximately one week following playing football and collision with another player. Expresses occasional discomfort that his gradually improved since initial onset. Denies any blood in urine or hematomas.    Allergies  Allergen Reactions  . Atorvastatin   . Citalopram   . Ezetimibe-Simvastatin     REACTION: up cpk  . Fluoxetine Hcl   . Rosuvastatin     REACTION: up cpk  . Venlafaxine       Outpatient Medications Prior to Visit  Medication Sig Dispense Refill  . fluorouracil (EFUDEX) 5 % cream Apply 1 drop topically 2 (two) times daily.    . hydrOXYzine (ATARAX/VISTARIL) 25 MG tablet Take 25 mg by mouth at bedtime.    . Melatonin 3 MG CAPS Take by mouth at bedtime.      . sildenafil (VIAGRA) 100 MG tablet Take 1 tablet (100 mg total) by mouth daily as needed. 6 tablet 5  . Multiple Vitamins-Minerals (MULTIVITAMIN WITH MINERALS) tablet Take 1 tablet by mouth daily.    Marland Kitchen allopurinol (ZYLOPRIM) 100 MG tablet Take 1 tablet (100 mg total) by mouth daily. 30 tablet 6  . BENICAR 20 MG  tablet TAKE 1 TABLET ONCE DAILY. 30 tablet 0  . beta carotene 25000 UNIT capsule Take 25,000 Units by mouth daily.    . budesonide-formoterol (SYMBICORT) 160-4.5 MCG/ACT inhaler Inhale 2 puffs into the lungs 2 (two) times daily. 1 Inhaler 3  . colchicine 0.6 MG tablet Take 1 tablet (0.6 mg total) by mouth 2 (two) times daily. 30 tablet 2  . ibuprofen (ADVIL,MOTRIN) 800 MG tablet Take 800 mg by mouth every 8 (eight) hours as needed for pain.    . vitamin C (ASCORBIC ACID) 500 MG tablet Take 500 mg by mouth daily.    . vitamin E 400 UNIT capsule Take 400 Units by mouth daily.     No facility-administered medications prior to visit.       Past Surgical History:  Procedure Laterality Date  . COLONOSCOPY     @ 49 due to rectal symptoms  . HERNIA REPAIR  2008   Bilateral  . 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 MELANOMA EXCISION WITH SENTINEL LYMPH NODE BIOPSY Left 01/15/2013   Procedure: EXCISION LEFT LEG MELANOMA WITH LEFT INGUINAL SENTINEL NODE BIOPSY;  Surgeon: Rolm Bookbinder, MD;  Location: [facility];  Service: General;  Laterality: Left;  . TONSILLECTOMY  age 75      Past Medical History:  Diagnosis Date  .  Arthralgia of wrist, left   . 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 Erectile dysfunction   . Gout    Foot  . Hyperlipidemia   . Insomnia   . PONV (postoperative nausea and vomiting)       Review of Systems  Constitutional: Negative for chills and fever.  HENT: Positive for congestion. Negative for sinus pain, sinus pressure and sore throat.   Respiratory: Positive for cough. Negative for chest tightness and shortness of breath.   Genitourinary: Positive for testicular pain. Negative for decreased urine volume, penile pain, penile swelling and scrotal swelling.  Neurological: Negative for headaches.      Objective:    BP 138/88 (BP Location: Left Arm, Patient Position: Sitting, Cuff Size: Large)   Pulse 85   Temp 98.9 F (37.2 C)  (Oral)   Resp 16   Ht 6' (1.829 m)   Wt 206 lb (93.4 kg)   SpO2 95%   BMI 27.94 kg/m  Nursing note and vital signs reviewed.  Physical Exam  Constitutional: He is oriented to person, place, and time. He appears well-developed and well-nourished. No distress.  HENT:  Right Ear: Hearing, tympanic membrane, external ear and ear canal normal.  Left Ear: Hearing, tympanic membrane, external ear and ear canal normal.  Nose: Nose normal.  Mouth/Throat: Uvula is midline, oropharynx is clear and moist and mucous membranes are normal.  Cardiovascular: Normal rate, regular rhythm, normal heart sounds and intact distal pulses.   Pulmonary/Chest: Effort normal and breath sounds normal.  Abdominal: Hernia confirmed negative in the right inguinal area and confirmed negative in the left inguinal area.  Genitourinary: Penis normal. Cremasteric reflex is present. Right testis shows no mass, no swelling and no tenderness. Right testis is descended. Cremasteric reflex is not absent on the right side. Left testis shows tenderness. Left testis shows no mass and no swelling. Left testis is descended. Cremasteric reflex is not absent on the left side.  Lymphadenopathy:       Right: No inguinal adenopathy present.       Left: No inguinal adenopathy present.  Neurological: He is alert and oriented to person, place, and time.  Skin: Skin is warm and dry.  Psychiatric: He has a normal mood and affect. His behavior is normal. Judgment and thought content normal.       Assessment & Plan:   Problem List Items Addressed This Visit      Respiratory   Acute upper respiratory infection    Symptoms and exam consistent with acute upper respiratory infection most likely viral. Recommend over-the-counter medications as needed for symptom relief and supportive care. Start Hycodan as needed for cough and sleep. Follow-up if symptoms worsen or do not improve.      Relevant Medications   HYDROcodone-homatropine  (HYCODAN) 5-1.5 MG/5ML syrup     Other   Testicular pain, left - Primary    Left testicular pain with no evidence of torsion or other red flags. Most likely mild contusion secondary to contact. No urinary symptoms. Recommend watchful waiting and follow up if symptoms worsen or do not improve.           I have discontinued Mr. Smisek multivitamin with minerals, vitamin C, vitamin E, beta carotene, ibuprofen, budesonide-formoterol, allopurinol, colchicine, and BENICAR. I am also having him start on HYDROcodone-homatropine. Additionally, I am having him maintain his Melatonin, hydrOXYzine, sildenafil, and fluorouracil.   Meds ordered this encounter  Medications  . HYDROcodone-homatropine (HYCODAN) 5-1.5 MG/5ML syrup  Sig: Take 5 mLs by mouth every 8 (eight) hours as needed for cough.    Dispense:  180 mL    Refill:  0    Order Specific Question:   Supervising Provider    Answer:   Pricilla Holm A [id number]     Follow-up: Return if symptoms worsen or fail to improve.  Mauricio Po, FNP

## 2016-04-26 NOTE — Assessment & Plan Note (Signed)
Left testicular pain with no evidence of torsion or other red flags. Most likely mild contusion secondary to contact. No urinary symptoms. Recommend watchful waiting and follow up if symptoms worsen or do not improve.

## 2016-04-26 NOTE — Assessment & Plan Note (Signed)
Symptoms and exam consistent with acute upper respiratory infection most likely viral. Recommend over-the-counter medications as needed for symptom relief and supportive care. Start Hycodan as needed for cough and sleep. Follow-up if symptoms worsen or do not improve.

## 2016-10-09 ENCOUNTER — Telehealth: Payer: Self-pay | Admitting: Family

## 2016-10-09 DIAGNOSIS — F419 Anxiety disorder, unspecified: Secondary | ICD-10-CM

## 2016-10-09 NOTE — Telephone Encounter (Signed)
Pt would like a referral to behavioral health, he is having anxiety and would like some counseling as well.

## 2016-10-10 NOTE — Telephone Encounter (Signed)
Referral placed.

## 2017-01-25 ENCOUNTER — Encounter: Payer: Self-pay | Admitting: Family

## 2017-01-25 ENCOUNTER — Ambulatory Visit (INDEPENDENT_AMBULATORY_CARE_PROVIDER_SITE_OTHER): Payer: 59 | Admitting: Family

## 2017-01-25 VITALS — BP 150/78 | HR 60 | Temp 98.4°F | Resp 16 | Ht 72.0 in | Wt 201.8 lb

## 2017-01-25 DIAGNOSIS — G479 Sleep disorder, unspecified: Secondary | ICD-10-CM | POA: Diagnosis not present

## 2017-01-25 DIAGNOSIS — R519 Headache, unspecified: Secondary | ICD-10-CM | POA: Insufficient documentation

## 2017-01-25 DIAGNOSIS — Z23 Encounter for immunization: Secondary | ICD-10-CM

## 2017-01-25 DIAGNOSIS — R51 Headache: Secondary | ICD-10-CM | POA: Diagnosis not present

## 2017-01-25 MED ORDER — TRAZODONE HCL 50 MG PO TABS: 25.0000 mg | ORAL_TABLET | Freq: Every evening | ORAL | 1 refills | Status: DC | PRN

## 2017-01-25 NOTE — Assessment & Plan Note (Signed)
New onset tension type headaches with questionable relation to stress or decreased sleep that resolve in the morning. Does not appear to be consistent with sleep apnea with given no significant symptoms although cannot be ruled out. Sample of Vimovo. Patient requests follow up with neurology secondary to family history of dementia. Referral placed. Follow up if frequency or intensity of headaches increase.

## 2017-01-25 NOTE — Progress Notes (Signed)
Subjective:    Patient ID: Raymond Long, male    DOB: 1960/09/02, 56 y.o.   MRN: [id number]  Chief Complaint  Patient presents with  . Headache    has been having headaches more recently and trouble sleeping, wants to be sent to neurology     HPI:  Raymond Long is a 56 y.o. male who  has a past medical history of Arthralgia of wrist, left; Cancer ([place]); Erectile dysfunction; Gout; Hyperlipidemia; Insomnia; and PONV (postoperative nausea and vomiting). and presents today for an office visit.  This is a new problem. Associated symptom of a headache has been going on for about months. Headache is described as dull and has improved with the modifying factors of Aleve and generally will go away in a few hours after waking up. Denies any head injury or trauma. No changes in vision. Has concern that his family history is significant for dementia that started off with headaches and decreased sleep. Describes going to sleep without problem and then wakes up every 2 hours. Denies snoring or hypersomnolence.  Endorses a high energy, high pressure job. Course of the symptoms have worsened since initial onset. Does have concern for addictive medications as he has a history of substance abuse. Liquid melatonin has helped a little. Has also tried over the counter GABA which helps him sleep but does not send him off to sleep.   Allergies  Allergen Reactions  . Atorvastatin   . Citalopram   . Ezetimibe-Simvastatin     REACTION: up cpk  . Fluoxetine Hcl   . Rosuvastatin     REACTION: up cpk  . Venlafaxine       Outpatient Medications Prior to Visit  Medication Sig Dispense Refill  . fluorouracil (EFUDEX) 5 % cream Apply 1 drop topically 2 (two) times daily.    Marland Kitchen HYDROcodone-homatropine (HYCODAN) 5-1.5 MG/5ML syrup Take 5 mLs by mouth every 8 (eight) hours as needed for cough. 180 mL 0  . hydrOXYzine (ATARAX/VISTARIL) 25 MG tablet Take 25 mg by mouth at bedtime.    . Melatonin 3 MG CAPS  Take by mouth at bedtime.      . sildenafil (VIAGRA) 100 MG tablet Take 1 tablet (100 mg total) by mouth daily as needed. 6 tablet 5   No facility-administered medications prior to visit.       Past Surgical History:  Procedure Laterality Date  . COLONOSCOPY     @ 49 due to rectal symptoms  . HERNIA REPAIR  2008   Bilateral  . 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 MELANOMA EXCISION WITH SENTINEL LYMPH NODE BIOPSY Left 01/15/2013   Procedure: EXCISION LEFT LEG MELANOMA WITH LEFT INGUINAL SENTINEL NODE BIOPSY;  Surgeon: Rolm Bookbinder, MD;  Location: [facility];  Service: General;  Laterality: Left;  . TONSILLECTOMY  age 17      Past Medical History:  Diagnosis Date  . Arthralgia of wrist, left   . 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 Erectile dysfunction   . Gout    Foot  . Hyperlipidemia   . Insomnia   . PONV (postoperative nausea and vomiting)       Review of Systems  Constitutional: Negative for chills and fever.  HENT: Negative for congestion.   Respiratory: Negative for chest tightness, shortness of breath and wheezing.   Cardiovascular: Negative for chest pain, palpitations and leg swelling.  Neurological: Positive for headaches.  Psychiatric/Behavioral: Positive for  sleep disturbance. Negative for dysphoric mood. The patient is not nervous/anxious.       Objective:    BP (!) 150/78 (BP Location: Left Arm, Patient Position: Sitting, Cuff Size: Large)   Pulse 60   Temp 98.4 F (36.9 C) (Oral)   Resp 16   Ht 6' (1.829 m)   Wt 201 lb 12.8 oz (91.5 kg)   SpO2 98%   BMI 27.37 kg/m  Nursing note and vital signs reviewed.  Physical Exam  Constitutional: He is oriented to person, place, and time. He appears well-developed and well-nourished. No distress.  Eyes: Pupils are equal, round, and reactive to light. Conjunctivae and EOM are normal.  Cardiovascular: Normal rate, regular rhythm, normal heart sounds and intact distal pulses.   Exam reveals no gallop and no friction rub.   No murmur heard. Pulmonary/Chest: Effort normal and breath sounds normal. No respiratory distress. He has no wheezes. He has no rales. He exhibits no tenderness.  Neurological: He is alert and oriented to person, place, and time. No cranial nerve deficit.  Skin: Skin is warm and dry.  Psychiatric: He has a normal mood and affect. His behavior is normal. Judgment and thought content normal.       Assessment & Plan:   Problem List Items Addressed This Visit      Other   Disturbance in sleep behavior    Continues to experience sleep disturbance refractory to over-the-counter melatonin. Discussed importance of good sleep hygiene. Patient was concern for any medications allergic to properties given previous history of substance abuse. Start trazodone. Follow-up pending trial of medication or if symptoms worsen or do not improve.      Relevant Orders   Ambulatory referral to Neurology   Generalized headache - Primary    New onset tension type headaches with questionable relation to stress or decreased sleep that resolve in the morning. Does not appear to be consistent with sleep apnea with given no significant symptoms although cannot be ruled out. Sample of Vimovo. Patient requests follow up with neurology secondary to family history of dementia. Referral placed. Follow up if frequency or intensity of headaches increase.       Relevant Medications   traZODone (DESYREL) 50 MG tablet   Other Relevant Orders   Ambulatory referral to Neurology    Other Visit Diagnoses    Need for influenza vaccination       Relevant Orders   Flu Vaccine QUAD 36+ mos IM (Completed)       I am having Raymond Long start on traZODone. I am also having him maintain his Melatonin, hydrOXYzine, sildenafil, fluorouracil, and HYDROcodone-homatropine.   Meds ordered this encounter  Medications  . traZODone (DESYREL) 50 MG tablet    Sig: Take 0.5-1 tablets (25-50 mg  total) by mouth at bedtime as needed for sleep.    Dispense:  30 tablet    Refill:  1    Order Specific Question:   Supervising Provider    Answer:   Pricilla Holm A [[id number]     Follow-up: Return in about 1 month (around 02/24/2017), or if symptoms worsen or fail to improve.  Mauricio Po, FNP

## 2017-01-25 NOTE — Patient Instructions (Addendum)
Thank you for choosing [facility].  SUMMARY AND INSTRUCTIONS:  Start the Trazodone as needed for sleep.  Continue to work on good sleep hygeine.   Start Vimovo as needed for headaches.   A referral to neurology has been placed.  Medication:  Your prescription(s) have been submitted to your pharmacy or been printed and provided for you. Please take as directed and contact our office if you believe you are having problem(s) with the medication(s) or have any questions.  Follow up:  If your symptoms worsen or fail to improve, please contact our office for further instruction, or in case of emergency go directly to the emergency room at the closest medical facility.

## 2017-01-25 NOTE — Assessment & Plan Note (Signed)
Continues to experience sleep disturbance refractory to over-the-counter melatonin. Discussed importance of good sleep hygiene. Patient was concern for any medications allergic to properties given previous history of substance abuse. Start trazodone. Follow-up pending trial of medication or if symptoms worsen or do not improve.

## 2017-02-04 ENCOUNTER — Encounter: Payer: Self-pay | Admitting: Neurology

## 2017-02-04 ENCOUNTER — Ambulatory Visit (INDEPENDENT_AMBULATORY_CARE_PROVIDER_SITE_OTHER): Payer: 59 | Admitting: Neurology

## 2017-02-04 VITALS — BP 157/97 | HR 62 | Ht 72.0 in | Wt 201.0 lb

## 2017-02-04 DIAGNOSIS — F519 Sleep disorder not due to a substance or known physiological condition, unspecified: Secondary | ICD-10-CM

## 2017-02-04 DIAGNOSIS — G4752 REM sleep behavior disorder: Secondary | ICD-10-CM | POA: Diagnosis not present

## 2017-02-04 DIAGNOSIS — F5109 Other insomnia not due to a substance or known physiological condition: Secondary | ICD-10-CM

## 2017-02-04 DIAGNOSIS — G4700 Insomnia, unspecified: Secondary | ICD-10-CM | POA: Diagnosis not present

## 2017-02-04 NOTE — Patient Instructions (Signed)

## 2017-02-04 NOTE — Progress Notes (Signed)
SLEEP MEDICINE CLINIC   Provider:  Larey Seat, M D  Primary Care Physician:  Golden Circle, FNP   Referring Provider: Golden Circle, FNP    Chief Complaint  Patient presents with  . New Patient (Initial Visit)    pt alone. rm 11, over 6 mths he has been having difficulty with sleep, avg 5 hrs or less of sleep. he has been having headaches daily once he wakes up. pt states that he has had some vision blurriness that has developed about the same time that the headaches. pt states that he has some concerns of dementia. pt states that on both sides of his family dementia  is present and they all started experiencing early dementia in their 62's    HPI:  Raymond Long is a 56 y.o. male, caucasian and left handed , seen here as in a referral  from [facility] for a sleep evaluation. I have met him 7 or 8 years ago with his partner.   Chief complaint according to patient : insomnia for the last 8-6 month.   Mr. Creeden reports that he had issues with insomnia for much of his life, and for a while he used alcohol and other nonprescription substances to help him to relax and go to sleep. He became sober and clean about 15 years ago. Over the last 6-8 months he has very great difficulties to initiate sleep and stay asleep, and responded to several sleep aids in various ways. Trazodone gave him palpitations, hydroxyzine seems to leave him groggy in the morning, and he has reported a greater level of stress at the workplace affecting also his ability to put his mind at ease at night.   I reviewed the patient's medications he has been as needed on cough medicine, Atarax at bedtime 25 mg, he takes melatonin OTC at bedtime Viagra as needed, Efudex cream for topical application.   He is to be very careful with hydrocortisone cough medicine giving his history. His medical diagnoses include arthralgia of the left wrist, skin cancer, gout, hyperlipidemia, insomnia.  Sleep habits are as  follows: He does not take his laptop or TV into the bedroom but is usually watching either one within the hour of going to bed. His intended bedtime is between 10 and 10:30 PM, he sleeps on a sleep number bed. His bedroom is cool, quiet and dark. He shares the bedroom with his partner, and he feels that the first 2 hours of sleep are his best, most sound.  Sleeps with pillow between knees, on his sides. After that history changes and is more fragmented. He cannot breath sleeping on a flat bed, elevates the head of bed.  He always wakes up around 1 AM, he looks at the clock, he may go to the bathroom but it is not the urge to urinate but wakes him. He has a dull headache when he wakes up, has palpitations, is some nights clammy. He has vivid dreams , is not sue how often he enacted them.  He takes aleve for headaches. He feels un-rested in AM- he is drained. Wakes at 5.30 AM. Cup of coffee and aleve each day. Sleeps an average less than 5 hours.  On weekends he feels a little better- he is not under the same stress.   Sleep medical history and family sleep history: family hisotry of dementia- all had sleep problems. Older sister died of dementia.  Early onset in their 71s.  4  Maternal uncles all have dementia. Paternal uncle and mother in their 8s. All alcoholics.   Social history:  Married, living with a partner, 2 cats - sleeping in the bed.  Review of Systems: Out of a complete 14 system review, the patient complains of only the following symptoms, and all other reviewed systems are negative.  no snoring.   Epworth score  10 , Fatigue severity score 33  , depression score 1/ 15   Social History   Social History  . Marital status: Soil scientist    Spouse name: N/A  . Number of children: N/A  . Years of education: N/A   Occupational History  . Not on file.   Social History Main Topics  . Smoking status: Former Smoker    Quit date: 01/09/2001  . Smokeless tobacco: Never Used      Comment: 1978-2002, up to 2 ppd  . 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 Drug use: No  . Sexual activity: Not on file   Other Topics Concern  . Not on file   Social History Narrative  . No narrative on file    Family History  Problem Relation Age of Onset  . Heart attack Mother        < 78  . Dementia Mother   . Heart attack Father 28    Past Medical History:  Diagnosis Date  . Arthralgia of wrist, left   . 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 Erectile dysfunction   . Gout    Foot  . Hyperlipidemia   . Insomnia   . PONV (postoperative nausea and vomiting)     Past Surgical History:  Procedure Laterality Date  . COLONOSCOPY     @ 49 due to rectal symptoms  . HERNIA REPAIR  2008   Bilateral  . 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 MELANOMA EXCISION WITH SENTINEL LYMPH NODE BIOPSY Left 01/15/2013   Procedure: EXCISION LEFT LEG MELANOMA WITH LEFT INGUINAL SENTINEL NODE BIOPSY;  Surgeon: Rolm Bookbinder, MD;  Location: [facility];  Service: General;  Laterality: Left;  . TONSILLECTOMY  age 27    Current Outpatient Prescriptions  Medication Sig Dispense Refill  . fluorouracil (EFUDEX) 5 % cream Apply 1 drop topically 2 (two) times daily.    Marland Kitchen HYDROcodone-homatropine (HYCODAN) 5-1.5 MG/5ML syrup Take 5 mLs by mouth every 8 (eight) hours as needed for cough. 180 mL 0  . hydrOXYzine (ATARAX/VISTARIL) 25 MG tablet Take 25 mg by mouth at bedtime.    . Melatonin 3 MG CAPS Take by mouth at bedtime.      . sildenafil (VIAGRA) 100 MG tablet Take 1 tablet (100 mg total) by mouth daily as needed. 6 tablet 5  . traZODone (DESYREL) 50 MG tablet Take 0.5-1 tablets (25-50 mg total) by mouth at bedtime as needed for sleep. (Patient not taking: Reported on 02/04/2017) 30 tablet 1   No current facility-administered medications for this visit.     Allergies as of 02/04/2017 - Review Complete 02/04/2017  Allergen Reaction Noted  . Atorvastatin    .  Citalopram    . Ezetimibe-simvastatin    . Fluoxetine hcl    . Rosuvastatin    . Venlafaxine    . Trazodone and nefazodone Palpitations 02/04/2017    Vitals: BP (!) 157/97   Pulse 62   Ht 6' (1.829 m)   Wt 201 lb (91.2 kg)  BMI 27.26 kg/m  Last Weight:  Wt Readings from Last 1 Encounters:  02/04/17 201 lb (91.2 kg)   UDT:HYHO mass index is 27.26 kg/m.     Last Height:   Ht Readings from Last 1 Encounters:  02/04/17 6' (1.829 m)    Physical exam:  General: The patient is awake, alert and appears not in acute distress. The patient is well groomed. Head: Normocephalic, atraumatic. Neck is supple. Mallampati 3,  neck circumference:17.25. Nasal airflow patent , TMJ is not  evident .  Bruxism, tongue bite. . Retrognathia is not seen.  Cardiovascular:  Regular rate and rhythm , without  murmurs or carotid bruit, and without distended neck veins. Respiratory: Lungs are clear to auscultation. Skin:  Without evidence of edema, or rash Trunk: BMI is . The patient's posture is erect   Neurologic exam : The patient is awake and alert, oriented to place and time.   Memory subjective described as intact.  Reports delays in word finding.  Attention span & concentration ability appears normal.  Speech is fluent,  without dysarthria, dysphonia or aphasia.  Mood and affect are appropriate.  Cranial nerves: Pupils are equal and briskly reactive to light. Funduscopic exam without evidence of pallor or edema. Extraocular movements  in vertical and horizontal planes intact and without nystagmus. Visual fields by finger perimetry are intact. Hearing to finger rub intact.  Facial sensation intact to fine touch. Facial motor strength is symmetric and tongue and uvula move midline. Shoulder shrug was symmetrical.   Motor exam:  Normal tone, muscle bulk and symmetric strength in all extremities. Sensory:  Fine touch, pinprick and vibration were tested in all extremities. Proprioception tested in  the upper extremities was normal. Coordination: Rapid alternating movements in the fingers/hands was normal.Finger-to-nose maneuver normal without evidence of ataxia, dysmetria or tremor. Gait and station: Patient walks without assistive device. Turns with 3 Steps. Romberg testing is negative. Deep tendon reflexes: in the  upper and lower extremities are symmetric and intact. Babinski maneuver response is downgoing.  Assessment:  After physical and neurologic examination, review of laboratory studies,  Personal review of imaging studies, reports of other /same  Imaging studies, results of polysomnography and / or neurophysiology testing and pre-existing records as far as provided in visit., my assessment is   1)  Early morning arousals.  Order sleep study with full EEG-   2)  Worried about dementia- strong family history , he wants to be tested. MOCA and referral to dementia study if necessary. MOCA was 27/30 this time.   3) I will RV after sleep tests are done-    The patient was advised of the nature of the diagnosed disorder , the treatment options and the  risks for general health and wellness arising from not treating the condition.   I spent more than 45 minutes of face to face time with the patient. Greater than 50% of time was spent in counseling and coordination of care. We have discussed the diagnosis and differential and I answered the patient's questions.    Plan:  Treatment plan and additional workup :  MOCA and sleep study  RV in 2 month    Larey Seat, MD 88/11/5795, 28:20 AM  Certified in Neurology by ABPN Certified in [place] by [facility] [address], [place], [place] [address]

## 2017-03-14 ENCOUNTER — Telehealth: Payer: Self-pay | Admitting: Neurology

## 2017-03-14 DIAGNOSIS — G4761 Periodic limb movement disorder: Secondary | ICD-10-CM

## 2017-03-14 DIAGNOSIS — G2581 Restless legs syndrome: Secondary | ICD-10-CM

## 2017-03-14 DIAGNOSIS — G4730 Sleep apnea, unspecified: Secondary | ICD-10-CM

## 2017-03-14 DIAGNOSIS — G4710 Hypersomnia, unspecified: Secondary | ICD-10-CM

## 2017-03-14 NOTE — Addendum Note (Signed)
Addended by: Larey Seat on: 03/14/2017 04:25 PM   Modules accepted: Orders

## 2017-03-14 NOTE — Telephone Encounter (Signed)
[employer] denied Split.

## 2017-04-09 ENCOUNTER — Telehealth: Payer: Self-pay | Admitting: Neurology

## 2017-04-09 NOTE — Telephone Encounter (Signed)
We have attempted to call the patient 2 times to schedule sleep study. Patient has been unavailable at the phone numbers we have on file and has not returned our calls. At this point we will send a letter asking pt to please contact the sleep lab to schedule their sleep study. If patient calls back we will schedule them for their sleep study. ° °

## 2017-05-02 ENCOUNTER — Ambulatory Visit (INDEPENDENT_AMBULATORY_CARE_PROVIDER_SITE_OTHER): Payer: 59 | Admitting: Neurology

## 2017-05-02 DIAGNOSIS — G4733 Obstructive sleep apnea (adult) (pediatric): Secondary | ICD-10-CM | POA: Diagnosis not present

## 2017-05-02 DIAGNOSIS — G4730 Sleep apnea, unspecified: Secondary | ICD-10-CM

## 2017-05-02 DIAGNOSIS — G2581 Restless legs syndrome: Secondary | ICD-10-CM

## 2017-05-02 DIAGNOSIS — G4761 Periodic limb movement disorder: Secondary | ICD-10-CM

## 2017-05-02 DIAGNOSIS — G4710 Hypersomnia, unspecified: Secondary | ICD-10-CM

## 2017-05-03 ENCOUNTER — Other Ambulatory Visit: Payer: Self-pay | Admitting: Neurology

## 2017-05-03 DIAGNOSIS — G4730 Sleep apnea, unspecified: Principal | ICD-10-CM

## 2017-05-03 DIAGNOSIS — R519 Headache, unspecified: Secondary | ICD-10-CM

## 2017-05-03 DIAGNOSIS — R51 Headache: Secondary | ICD-10-CM

## 2017-05-03 DIAGNOSIS — G4700 Insomnia, unspecified: Secondary | ICD-10-CM

## 2017-05-03 NOTE — Procedures (Signed)
[facility] [place], [place] [address] NAME:   Raymond Long                                                       DOB: 1960-08-07 MEDICAL RECORD [id number]                                          DOS: 05/02/2017 REFERRING PHYSICIAN: Mauricio Po, FNP STUDY PERFORMED: HST  HISTORY:  Raymond Long reports that he had issues with insomnia for much of his life, and for a while he used alcohol and other nonprescription substances to help him to relax and go to sleep. He became sober and clean about 15 years ago. Over the last 6-8 months he has very great difficulties to initiate sleep and stay asleep, and responded to several sleep aids in various ways. Trazodone gave him palpitations, hydroxyzine seems to leave him groggy in the morning, and he has reported a greater level of stress at the workplace- affecting his ability to put his mind at ease at night.  He always wakes up around 1 AM. He has a dull headache when he wakes up, has palpitations, is some nights clammy. He has vivid dreams, is not sue how often he enacted them. Sleeps an average less than 5 hours. On weekends he feels a little better- he is not under the same stress.    Epworth Sleepiness score at 10 points. Fatigue severity score at 33 points.  BMI: 27.2  STUDY RESULTS: watch pat device. Total Recording Time:  7 hours, 57 minutes  Total Apnea/Hypopnea Index (AHI):  18.0 /hr. RDI was 29.3/hr. Average Oxygen Saturation:   95%; Lowest Oxygen Desaturation: 87 %  Total Time Oxygen Saturation Below 89%:  0.0 minutes  Average Heart Rate:  56 bpm (46-98 bpm, NSR)  IMPRESSION: A Moderate degree of obstructive sleep apnea was found, associated with loud snoring (RDI is high) but not associated with hypoxemia- no physiological stress on heart rate or rhythm resulted. RECOMMENDATION: Raymond Long can use CPAP to treat apnea and snoring, but his type of apnea can also respond to a dental device.   I usually start with a CPAP auto-titration, 5-15 cm water, mask of choice, heated humidity.    If anxiety or claustrophobia affect his ability to tolerate CPAP, will refer to sleep dental clinic. I certify that I have reviewed the raw data recording prior to the issuance of this report in accordance with the standards of the American Academy of Sleep Medicine (AASM). Larey Seat, M.D.   05-03-2017   Medical Director of [facility], [place] of the ABPN and ABSM, and accredited by AASM

## 2017-05-08 ENCOUNTER — Other Ambulatory Visit: Payer: Self-pay | Admitting: Neurology

## 2017-05-08 ENCOUNTER — Telehealth: Payer: Self-pay | Admitting: Neurology

## 2017-05-08 DIAGNOSIS — G4733 Obstructive sleep apnea (adult) (pediatric): Secondary | ICD-10-CM

## 2017-05-08 NOTE — Telephone Encounter (Signed)
-----   Message from Larey Seat, MD sent at 05/03/2017 12:57 PM EST ----- I left VM for the patient with the sleep study results at home phone. CD  IMPRESSION: A Moderate degree of obstructive sleep apnea was  found, associated with loud snoring (RDI is high) but not  associated with hypoxemia- no physiological stress on heart rate  or rhythm resulted. RECOMMENDATION: Raymond Long can use CPAP to treat apnea and  snoring, but his type of apnea can also respond to a dental  device.  I usually start with a CPAP auto-titration, 5-15 cm water, mask  of choice, heated humidity.   If anxiety or claustrophobia affect his ability to tolerate CPAP,  will refer to sleep dental clinic.

## 2017-05-08 NOTE — Telephone Encounter (Signed)
Called patient to discuss sleep study results. No answer at this time. LVM for the patient to call back.   

## 2017-05-08 NOTE — Telephone Encounter (Signed)
Pt returned call and states that Dr Brett Fairy had actually called him and went over his sleep study. He would like to attempt the dental device and see if his insurance will cover it before using the CPAP. I will place the referral for the dental device. If the insurance denies the dental device the patient will then call us back and will let us know to try the cpap route. Pt was appreciative for the call back.

## 2017-05-29 ENCOUNTER — Encounter: Payer: Self-pay | Admitting: Neurology

## 2017-05-29 ENCOUNTER — Ambulatory Visit (INDEPENDENT_AMBULATORY_CARE_PROVIDER_SITE_OTHER): Payer: 59 | Admitting: Neurology

## 2017-05-29 VITALS — BP 139/87 | HR 68 | Ht 72.0 in | Wt 202.5 lb

## 2017-05-29 DIAGNOSIS — K219 Gastro-esophageal reflux disease without esophagitis: Secondary | ICD-10-CM | POA: Diagnosis not present

## 2017-05-29 DIAGNOSIS — G4733 Obstructive sleep apnea (adult) (pediatric): Secondary | ICD-10-CM

## 2017-05-29 DIAGNOSIS — G4700 Insomnia, unspecified: Secondary | ICD-10-CM | POA: Diagnosis not present

## 2017-05-29 DIAGNOSIS — R0681 Apnea, not elsewhere classified: Secondary | ICD-10-CM

## 2017-05-29 MED ORDER — QUETIAPINE FUMARATE 25 MG PO TABS: 12.5000 mg | ORAL_TABLET | Freq: Every day | ORAL | 5 refills | Status: DC

## 2017-05-29 NOTE — Progress Notes (Signed)
Montreal Cognitive Assessment  05/29/2017  Visuospatial/ Executive (0/5) 4  Naming (0/3) 3  Attention: Read list of digits (0/2) 1  Attention: Read list of letters (0/1) 1  Attention: Serial 7 subtraction starting at 100 (0/3) 3  Language: Repeat phrase (0/2) 1  Language : Fluency (0/1) 1  Abstraction (0/2) 2  Delayed Recall (0/5) 5  Orientation (0/6) 6  Total 27

## 2017-05-29 NOTE — Progress Notes (Addendum)
SLEEP MEDICINE CLINIC   Provider:  Larey Seat, M D  Primary Care Physician:  Golden Circle, FNP   Referring Provider: Golden Circle, FNP    Chief Complaint  Patient presents with  . Follow-up    Patient has not been sleeping and not doing well    HPI:  Raymond Long is a 57 y.o. male, caucasian and left handed , seen here as in a referral  from [facility] [place] for a sleep evaluation.   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  He felt that his mood has changed and is rapidly shifting, that he is more anxious, and cannot be as tolerant of stress of any kind.  His home sleep test documented a total recording time on watch Pat at 7 hours and 57 minutes, but we were not quite sure how much of this is actually sleep.  There was a moderate degree of sleep apnea seen and loud snoring.  But there was no associated hypoxemia and no evidence of physiologic stress on his heart.  His average heart rate was 56 bpm, the average oxygen saturation was 95% his apnea index was 18/h, his respiratory disturbance index which include snoring gasping or choking was 29.3/h. He did not report nocturia but he struggles with insomnia, he gets about sleep 3 hours of sleep before he wakes up and has difficulties to get additional sleep and it seems to be extremely fragmented after that time. Currently he is taking hydroxyzine which is a prescription drug but he also takes melatonin.  He still only estimates 4-1/2 hours of sleep at the most.  My goal would be to use as a Seroquel to help him sleep 6-8 hours, or use a low-dose benzodiazepine which also is anxiolytic. He is scared of substance dependency. His mother had dementia.      Consult visit CD-  Chief complaint according to patient : Insomnia for the last 8-6 month.  Raymond Long reports that he had issues with insomnia for much of his life, and for a while he used alcohol and other nonprescription substances to help him to relax and go to sleep. He became sober and clean about 15 years ago. Over the last 6-8 months he has very great difficulties to initiate sleep and stay asleep, and responded to several sleep aids in various ways. Trazodone gave him palpitations, hydroxyzine seems to leave him groggy in the morning, and he has reported a greater level of stress at the workplace affecting also his ability to put his mind at ease at night.  I reviewed the patient's medications he has been as needed on cough medicine, Atarax at bedtime 25 mg, he takes melatonin OTC at bedtime Viagra as needed, Efudex cream for topical application. He is to be very careful with hydrocortisone cough medicine giving his history. His medical diagnoses include arthralgia of the left wrist, skin cancer, gout, hyperlipidemia, insomnia.  Sleep habits are as follows:He does not take his laptop or TV into the bedroom but is usually watching either one within the hour of going to bed. His intended bedtime is between 10 and 10:30 PM, he sleeps on a sleep number bed. His bedroom is cool,  quiet and dark. He shares the bedroom with his partner, and he feels that the first 2 hours of sleep are his best, most sound.  Sleeps with pillow between knees, on his sides.  After that history changes and is more fragmented. He cannot breath sleeping on a flat bed, elevates the head of bed.  He always wakes up around 1 AM, he looks at the clock, he may go to the bathroom but it is not the urge to urinate but wakes him. He has a dull headache when he wakes up, has palpitations, is some nights clammy. He has vivid dreams , is not sue how often he enacted them.  He takes aleve for headaches. He feels un-rested in AM- he is drained. Wakes at 5.30  AM. Cup of coffee and aleve each day. Sleeps an average less than 5 hours.  On weekends he feels a little better- he is not under the same stress.   Sleep medical history and family sleep history: family hisotry of dementia- all had sleep problems. Older sister died of dementia.  Early onset in their 30s.  4 Maternal uncles all have dementia. Paternal uncle and mother in their 59s. All alcoholics.   Social history: Married, living with a same sex partner, 2 cats - sleeping in the bed.  No flowsheet data found.     Review of Systems: Out of a complete 14 system review, the patient complains of only the following symptoms, and all other reviewed systems are negative.   He is not known to snore and concerned about the RDI interpretation of his HST, GERD - reflux  Apnea. On nexium.  Epworth score 12 from 10 increased  , Fatigue severity score 58 from 33  , depression score 3/ 15 , MOCA twice now 27/30 points.   Social History   Socioeconomic History  . Marital status: Soil scientist    Spouse name: Not on file  . Number of children: Not on file  . Years of education: Not on file  . Highest education level: Not on file  Social Needs  . Financial resource strain: Not on file  . Food insecurity - worry: Not on file  . Food insecurity - inability: Not on file  . Transportation needs - medical: Not on file  . Transportation needs - non-medical: Not on file  Occupational History  . Not on file  Tobacco Use  . Smoking status: Former Smoker    Last attempt to quit: 01/09/2001    Years since quitting: 16.3  . Smokeless tobacco: Never Used  . Tobacco comment: 1978-2002, up to 2 ppd  Substance and Sexual Activity  . 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 Drug use: No  . Sexual activity: Not on file  Other Topics Concern  . Not on file  Social History Narrative  . Not on file    Family History  Problem Relation Age of Onset  . Heart attack Mother        < 50  . Dementia Mother    . Heart attack Father 27    Past Medical History:  Diagnosis Date  . Arthralgia of wrist, left   . 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 Erectile dysfunction   . Gout    Foot  . Hyperlipidemia   . Insomnia   . PONV (postoperative nausea and vomiting)     Past Surgical History:  Procedure Laterality Date  . COLONOSCOPY     @  49 due to rectal symptoms  . HERNIA REPAIR  2008   Bilateral  . 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 MELANOMA EXCISION WITH SENTINEL LYMPH NODE BIOPSY Left 01/15/2013   Procedure: EXCISION LEFT LEG MELANOMA WITH LEFT INGUINAL SENTINEL NODE BIOPSY;  Surgeon: Rolm Bookbinder, MD;  Location: [facility];  Service: General;  Laterality: Left;  . TONSILLECTOMY  age 39    Current Outpatient Medications  Medication Sig Dispense Refill  . fluorouracil (EFUDEX) 5 % cream Apply 1 drop topically 2 (two) times daily.    Marland Kitchen HYDROcodone-homatropine (HYCODAN) 5-1.5 MG/5ML syrup Take 5 mLs by mouth every 8 (eight) hours as needed for cough. 180 mL 0  . hydrOXYzine (ATARAX/VISTARIL) 25 MG tablet Take 25 mg by mouth at bedtime.    . Melatonin 3 MG CAPS Take by mouth at bedtime.      . sildenafil (VIAGRA) 100 MG tablet Take 1 tablet (100 mg total) by mouth daily as needed. 6 tablet 5  . traZODone (DESYREL) 50 MG tablet Take 0.5-1 tablets (25-50 mg total) by mouth at bedtime as needed for sleep. 30 tablet 1   No current facility-administered medications for this visit.     Allergies as of 05/29/2017 - Review Complete 05/29/2017  Allergen Reaction Noted  . Atorvastatin    . Citalopram    . Ezetimibe-simvastatin    . Fluoxetine hcl    . Rosuvastatin    . Venlafaxine    . Trazodone and nefazodone Palpitations 02/04/2017    Vitals: BP 139/87   Pulse 68   Ht 6' (1.829 m)   Wt 202 lb 8 oz (91.9 kg)   BMI 27.46 kg/m  Last Weight:  Wt Readings from Last 1 Encounters:  05/29/17 202 lb 8 oz (91.9 kg)   ZOX:WRUE mass index is 27.46 kg/m.      Last Height:   Ht Readings from Last 1 Encounters:  05/29/17 6' (1.829 m)    Physical exam:  General: The patient is awake, alert and appears anxious, a little hyper  The patient is well groomed. Head: Normocephalic, atraumatic. Neck is supple. Mallampati 3,  neck circumference:17.25. Nasal airflow patent , TMJ is not  evident .  Bruxism, tongue bite. . Retrognathia is not seen.  Cardiovascular:  Regular rate and rhythm , without  murmurs or carotid bruit, and without distended neck veins. Respiratory: Lungs are clear to auscultation.  Trunk: BMI is . The patient's posture is erect   Neurologic exam : The patient is awake and alert, oriented to place and time.   Memory subjective described as intact.  Reports delays in word finding.   MOCA was 27/30 points.  Attention span & concentration ability appears normal.  Speech is fluent,  without dysarthria, dysphonia or aphasia.  Mood and affect are appropriate.  Cranial nerves: Pupils are equal and briskly reactive to light.  Extraocular movements  in vertical and horizontal planes intact and without nystagmus. Visual fields by finger perimetry are intact.Hearing to finger rub intact.  Facial sensation intact to fine touch. Facial motor strength is symmetric and tongue and uvula move midline. Shoulder shrug was symmetrical.  Motor exam:  Normal tone, muscle bulk and symmetric strength in all extremities. Sensory:  Fine touch, pinprick and vibration were tested in all extremities. Proprioception tested in the upper extremities was normal. Coordination: Rapid alternating movements in the fingers/hands was normal.Finger-to-nose maneuver normal without evidence of ataxia, dysmetria or tremor. Gait and station: Patient  walks without assistive device. Turns with 3 Steps. Romberg testing is negative. Deep tendon reflexes: in the  upper and lower extremities are symmetric and intact. Babinski maneuver response is downgoing.  Assessment:  After  physical and neurologic examination, review of laboratory studies,  Personal review of imaging studies, reports of other /same  Imaging studies, results of polysomnography and / or neurophysiology testing and pre-existing records as far as provided in visit., my assessment is   1)  Early morning arousals.  Since the patient had to be evaluated by [employer] protocol, we have no explanation as to why he might have insomnia and she describes clearly that he can sleep for about 3 hours and then the restful sleep is over.  He has only gotten marginal benefit from hydroxyzine and melatonin.  He feels that sleep deprivation now has changed his productivity and his mood.  The home sleep test did indicate the presence of apnea and I will need to treat this degree of apnea, I will order an auto titrated today.  However I will also add an medication for the treatment of insomnia namely Seroquel 25 mg nightly, I advised the patient to start with 1/2 tablet.  These tablets are very tiny and need to be cut with a pill cutter.  25 mg is the lowest produced dose for the medication.  2)    MOCA was 27/30 in November  2018 and again on 05-29-2017 . NOT DEMENTIA.   3) No snoring by history , and his partner agreed- he presents with a high RDI- usually indicating snoring, may be acid related apnea.     The patient was advised of the nature of the diagnosed disorder , the treatment options and the  risks for general health and wellness arising from not treating the condition.  I spent more than 38minutes of face to face time with the patient. Greater than 50% of time was spent in counseling and coordination of care. We have discussed the diagnosis and differential and I answered the patient's questions.    Plan:  Treatment plan and additional workup :  Auto CPAP  6-16 cm water , used with 3 cm EPR. He would like to try a nasal dream wear. Seroquel 25 mg for insomnia.      Larey Seat, MD 5/36/6440, 34:74 AM    Certified in Neurology by [employer] Certified in [place] by [facility] [address], [place], [place] [address]

## 2017-05-29 NOTE — Patient Instructions (Addendum)
Please remember to try to maintain good sleep hygiene, which means: Keep a regular sleep and wake schedule, try not to exercise or have a meal within 2 hours of your bedtime, try to keep your bedroom conducive for sleep, that is, cool and dark, without light distractors such as an illuminated alarm clock, and refrain from watching TV right before sleep or in the middle of the night and do not keep the TV or radio on during the night. Also, try not to use or play on electronic devices at bedtime, such as your cell phone, tablet PC or laptop. If you like to read at bedtime on an electronic device, try to dim the background light as much as possible. Do not eat in the middle of the night.    For chronic insomnia, you are best followed by a psychiatrist and/or sleep psychologist.  We will try to treat your apnea and medication for insomnia.

## 2017-07-01 ENCOUNTER — Telehealth: Payer: Self-pay | Admitting: Neurology

## 2017-07-01 NOTE — Telephone Encounter (Signed)
He gets 4 g hours of deep sleep on Seroquel but suicidal thoughts.  desyrel is not longer taken, just will keep on vystaril- I am researching other medication.

## 2017-07-01 NOTE — Telephone Encounter (Signed)
Pt called today and states that in [place] he was started on Seroquel. He states that he has gave it a month and doesn't feel like this medication is aggreeing with him, he states that he feels out of it, dull, cant think. He would like to know what next treatment plan would be and if he can stop this. I will mention this to Dr Brett Fairy and see what her recommendations are and then give him a call back. Pt verbalized understanding.

## 2017-07-02 ENCOUNTER — Telehealth: Payer: Self-pay | Admitting: Neurology

## 2017-07-02 NOTE — Telephone Encounter (Signed)
Recommend he follow up in the primary care office and establish with new provider as I am no longer with [facility].

## 2017-07-02 NOTE — Telephone Encounter (Signed)
Received a message from [place] in regards to the patient and him not starting CPAP at this time.   "Spoke with patient about getting his CPAP machine. He said that at this time he wants to wait in getting the machine to see how he is for a few months. He advised that if he decides to get the machine in the future that he will give [place] a call back."

## 2017-07-03 NOTE — Telephone Encounter (Signed)
I have left a vm for patient to call our office back to get set up with a provider taking patients at any of our office locations.

## 2017-07-31 ENCOUNTER — Encounter: Payer: Self-pay | Admitting: Adult Health

## 2017-07-31 ENCOUNTER — Ambulatory Visit (INDEPENDENT_AMBULATORY_CARE_PROVIDER_SITE_OTHER): Payer: 59 | Admitting: Adult Health

## 2017-07-31 VITALS — BP 152/90 | Temp 98.9°F | Wt 201.0 lb

## 2017-07-31 DIAGNOSIS — Z7689 Persons encountering health services in other specified circumstances: Secondary | ICD-10-CM

## 2017-07-31 DIAGNOSIS — I10 Essential (primary) hypertension: Secondary | ICD-10-CM

## 2017-07-31 DIAGNOSIS — G4700 Insomnia, unspecified: Secondary | ICD-10-CM | POA: Diagnosis not present

## 2017-07-31 MED ORDER — LISINOPRIL 5 MG PO TABS: 5.0000 mg | ORAL_TABLET | Freq: Every day | ORAL | 1 refills | Status: DC

## 2017-07-31 NOTE — Progress Notes (Signed)
Patient presents to clinic today to establish care. He is a pleasant 57 year old male who  has a past medical history of Arthralgia of wrist, left, Cancer ([facility]), Erectile dysfunction, Gout, Hyperlipidemia, Hypertension, Insomnia, and PONV (postoperative nausea and vomiting).  He is a former patient of Terri Piedra   His last physical was in 10/2015   Acute Concerns: Establish Care  Chronic Issues:  Insomnia - Was prescribed Seroquel 12.5 - 25 mg by Neurology. He feels as though he is sleeping better but feels as though " the medication makes me crazy." He has cut back to 6.25 mg QHS.   Hx of Melanoma - has had three separate surgeries to remove melanoma   Hyperlipidemia  - Has tried multiple statins in the past but did not respond well to them. Had "severe"  myalgias   Lab Results  Component Value Date   CHOL 283 (H) 10/12/2015   HDL 33.10 (L) 10/12/2015   LDLCALC 212 (H) 10/12/2015   LDLDIRECT 176.9 06/09/2008   TRIG 187.0 (H) 10/12/2015   CHOLHDL 9 10/12/2015   Essential Hypertension - Not currently on any medication. He has been prescribed Benicar in the past but did not respond well to it. He felt as though " it was making me drag".  He does check his blood pressure at home and has been getting readings consistent with BP today   BP Readings from Last 3 Encounters:  07/31/17 (!) 152/90  05/29/17 139/87  02/04/17 (!) 157/97   Health Maintenance: Dental -- Routine  Vision -- Routine  Immunizations --  Colonoscopy -- 2010  Diet: Tries to eat healthy.  Exericse: Does not exercise on a regular basis   Treatment Team  - Neurology - Dr. Brett Fairy  - Urology - Dr. Venia Minks - [place] - Dermatology - [facility]    Past Medical History:  Diagnosis Date  . Arthralgia of wrist, left   . 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 Erectile dysfunction   . Gout    Foot  . Hyperlipidemia   . Hypertension   . Insomnia   . PONV (postoperative nausea and vomiting)     Past  Surgical History:  Procedure Laterality Date  . COLONOSCOPY     @ 49 due to rectal symptoms  . HERNIA REPAIR  2008   Bilateral  . 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 MELANOMA EXCISION WITH SENTINEL LYMPH NODE BIOPSY Left 01/15/2013   Procedure: EXCISION LEFT LEG MELANOMA WITH LEFT INGUINAL SENTINEL NODE BIOPSY;  Surgeon: Rolm Bookbinder, MD;  Location: [facility];  Service: General;  Laterality: Left;  . TONSILLECTOMY  age 24    Current Outpatient Medications on File Prior to Visit  Medication Sig Dispense Refill  . fluorouracil (EFUDEX) 5 % cream Apply 1 drop topically 2 (two) times daily.    . hydrOXYzine (ATARAX/VISTARIL) 25 MG tablet Take 25 mg by mouth at bedtime.    . Melatonin 3 MG CAPS Take by mouth at bedtime.      Marland Kitchen QUEtiapine (SEROQUEL) 25 MG tablet Take 0.5-1 tablets (12.5-25 mg total) by mouth at bedtime. 30 tablet 5  . sildenafil (VIAGRA) 100 MG tablet Take 1 tablet (100 mg total) by mouth daily as needed. 6 tablet 5   No current facility-administered medications on file prior to visit.     Allergies  Allergen Reactions  . Atorvastatin   . Citalopram   . Ezetimibe-Simvastatin  REACTION: up cpk  . Fluoxetine Hcl   . Rosuvastatin     REACTION: up cpk  . Venlafaxine   . Trazodone And Nefazodone Palpitations    Family History  Problem Relation Age of Onset  . Heart attack Mother        < 79  . Dementia Mother   . Heart attack Father 62  . COPD Sister   . Breast cancer Sister     Social History   Socioeconomic History  . Marital status: Soil scientist    Spouse name: Not on file  . Number of children: Not on file  . Years of education: Not on file  . Highest education level: Not on file  Occupational History  . Not on file  Social Needs  . Financial resource strain: Not on file  . Food insecurity:    Worry: Not on file    Inability: Not on file  . Transportation needs:    Medical: Not on file     Non-medical: Not on file  Tobacco Use  . Smoking status: Former Smoker    Last attempt to quit: 01/09/2001    Years since quitting: 16.5  . Smokeless tobacco: Never Used  . Tobacco comment: 1978-2002, up to 2 ppd  Substance and Sexual Activity  . 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 Drug use: No  . Sexual activity: Not on file  Lifestyle  . Physical activity:    Days per week: Not on file    Minutes per session: Not on file  . Stress: Not on file  Relationships  . Social connections:    Talks on phone: Not on file    Gets together: Not on file    Attends religious service: Not on file    Active member of club or organization: Not on file    Attends meetings of clubs or organizations: Not on file    Relationship status: Not on file  . Intimate partner violence:    Fear of current or ex partner: Not on file    Emotionally abused: Not on file    Physically abused: Not on file    Forced sexual activity: Not on file  Other Topics Concern  . Not on file  Social History Narrative   He works in Product manager   No kids       [place] to be outside and hike.           Review of Systems  Constitutional: Negative.   Eyes: Negative.   Respiratory: Negative.   Cardiovascular: Negative.   Gastrointestinal: Negative.   Genitourinary: Negative.   Musculoskeletal: Negative.   Skin: Negative.   Neurological: Negative.   Endo/Heme/Allergies: Negative.   Psychiatric/Behavioral: The patient has insomnia.     BP (!) 152/90 (BP Location: Left Arm)   Temp 98.9 F (37.2 C) (Oral)   Wt 201 lb (91.2 kg)   BMI 27.26 kg/m   Physical Exam  Constitutional: He is oriented to person, place, and time and well-developed, well-nourished, and in no distress. No distress.  HENT:  Head: Normocephalic and atraumatic.  Right Ear: External ear normal.  Left Ear: External ear normal.  Nose: Nose normal.  Mouth/Throat: Oropharynx is clear and moist. No oropharyngeal exudate.    Eyes: Pupils are equal, round, and reactive to light. Conjunctivae and EOM are normal. Right eye exhibits no discharge. Left eye exhibits no discharge. No scleral icterus.  Neck:  No JVD present. No tracheal deviation present.  Cardiovascular: Normal rate, regular rhythm, normal heart sounds and intact distal pulses. Exam reveals no gallop and no friction rub.  No murmur heard. Pulmonary/Chest: Effort normal and breath sounds normal. No stridor. No respiratory distress. He has no wheezes. He has no rales. He exhibits no tenderness.  Musculoskeletal: Normal range of motion. He exhibits no edema, tenderness or deformity.  Neurological: He is alert and oriented to person, place, and time. He displays normal reflexes. No cranial nerve deficit. He exhibits normal muscle tone. Coordination normal. GCS score is 15.  Skin: Skin is warm and dry. No rash noted. He is not diaphoretic. No erythema. No pallor.  Psychiatric: Mood, memory, affect and judgment normal.  Nursing note and vitals reviewed.   Assessment/Plan: 1. Encounter to establish care - Follow up in 2 weeks for CPE  - Follow up sooner if needed  2. Essential hypertension - Will trial Lisinopril 5 mg and then work our way up if needed - lisinopril (PRINIVIL,ZESTRIL) 5 MG tablet; Take 1 tablet (5 mg total) by mouth daily.  Dispense: 30 tablet; Refill: 1  3. Insomnia, unspecified type - Continue with current Seroquel  - Will look into other medications    Dorothyann Peng, NP

## 2017-08-07 ENCOUNTER — Ambulatory Visit: Payer: Self-pay | Admitting: *Deleted

## 2017-08-07 NOTE — Telephone Encounter (Signed)
He called in c/o having blood in his stool X1 today.   When he wiped he noticed it was more blood than fecal matter.   Denies any pain, diarrhea or constipation.   This has never happened before.  He also mentioned he was started on Lisinopril 5mg  by Dorothyann Peng last week.   He has been having tingling in the back of his head and a headache intermittently since starting the medication.   Denies being dizzy.  He is also c/o feeling more tired than usual since starting the BP medication.    He has an appt with Dorothyann Peng on April 11th.   Per the protocol he is within the 2 week recommendation of being evaluated.  I instructed him to call us back if when he went to the bathroom again he had a bloody stool, started feeling dizzy, pale, weak, or like he was going to faint or breaking out into a sweat.  He verbalized understanding.   He did not want to make another appt at this point since he has an appt coming up due to cost.  I have routed a note to [facility] letting him know of the above information. Reason for Disposition . [1] Rectal bleeding is minimal (e.g., blood just on toilet paper, few drops, streaks on surface of normal formed BM) AND [2] bleeding recurs 3 or more times on treatment  Answer Assessment - Initial Assessment Questions 1. APPEARANCE of BLOOD: "What color is it?" "Is it passed separately, on the surface of the stool, or mixed in with the stool?"      When I wipe it's more blood than fecal matter.    2. AMOUNT: "How much blood was passed?"      Started today X1.    No pain with BM.   Things not flow as they normally do.   3. FREQUENCY: "How many times has blood been passed with the stools?"      X1 today.   I'm drinking a lot of water especially since start this medication. 4. ONSET: "When was the blood first seen in the stools?" (Days or weeks)      Today just the one time. 5. DIARRHEA: "Is there also some diarrhea?" If so, ask: "How many diarrhea stools were passed in past 24  hours?"      No 6. CONSTIPATION: "Do you have constipation?" If so, "How bad is it?"     No 7. RECURRENT SYMPTOMS: "Have you had blood in your stools before?" If so, ask: "When was the last time?" and "What happened that time?"      No  Never had this before.  Denies abd pain. 8. BLOOD THINNERS: "Do you take any blood thinners?" (e.g., Coumadin/warfarin, Pradaxa/dabigatran, aspirin)     I've been taking Aleve for the HA 9. OTHER SYMPTOMS: "Do you have any other symptoms?"  (e.g., abdominal pain, vomiting, dizziness, fever)     I've been having tingling in the back of my head intermittently and a headache.  Headache also comes and goes.   I'm feeling more tired than usual.    10. PREGNANCY: "Is there any chance you are pregnant?" "When was your last menstrual period?"       N/A  Protocols used: RECTAL BLEEDING-A-AH

## 2017-08-07 NOTE — Telephone Encounter (Signed)
FYI

## 2017-08-15 ENCOUNTER — Encounter: Payer: Self-pay | Admitting: Adult Health

## 2017-08-15 ENCOUNTER — Ambulatory Visit (INDEPENDENT_AMBULATORY_CARE_PROVIDER_SITE_OTHER): Payer: 59 | Admitting: Adult Health

## 2017-08-15 VITALS — BP 138/90 | Temp 98.3°F | Ht 71.25 in | Wt 201.0 lb

## 2017-08-15 DIAGNOSIS — Z0000 Encounter for general adult medical examination without abnormal findings: Secondary | ICD-10-CM | POA: Diagnosis not present

## 2017-08-15 DIAGNOSIS — G4700 Insomnia, unspecified: Secondary | ICD-10-CM

## 2017-08-15 DIAGNOSIS — Z114 Encounter for screening for human immunodeficiency virus [HIV]: Secondary | ICD-10-CM | POA: Diagnosis not present

## 2017-08-15 DIAGNOSIS — I10 Essential (primary) hypertension: Secondary | ICD-10-CM

## 2017-08-15 DIAGNOSIS — Z125 Encounter for screening for malignant neoplasm of prostate: Secondary | ICD-10-CM

## 2017-08-15 DIAGNOSIS — E782 Mixed hyperlipidemia: Secondary | ICD-10-CM

## 2017-08-15 LAB — CBC WITH DIFFERENTIAL/PLATELET
BASOS ABS: 0 10*3/uL (ref 0.0–0.1)
Basophils Relative: 0.2 % (ref 0.0–3.0)
EOS ABS: 0.1 10*3/uL (ref 0.0–0.7)
Eosinophils Relative: 2.2 % (ref 0.0–5.0)
HEMATOCRIT: 43.7 % (ref 39.0–52.0)
HEMOGLOBIN: 15.4 g/dL (ref 13.0–17.0)
Lymphocytes Relative: 38 % (ref 12.0–46.0)
Lymphs Abs: 1.6 10*3/uL (ref 0.7–4.0)
MCHC: 35.1 g/dL (ref 30.0–36.0)
MCV: 92.7 fl (ref 78.0–100.0)
Monocytes Absolute: 0.5 10*3/uL (ref 0.1–1.0)
Monocytes Relative: 11.9 % (ref 3.0–12.0)
Neutro Abs: 2 10*3/uL (ref 1.4–7.7)
Neutrophils Relative %: 47.7 % (ref 43.0–77.0)
Platelets: 137 10*3/uL — ABNORMAL LOW (ref 150.0–400.0)
RBC: 4.72 Mil/uL (ref 4.22–5.81)
RDW: 13 % (ref 11.5–15.5)
WBC: 4.2 10*3/uL (ref 4.0–10.5)

## 2017-08-15 LAB — LIPID PANEL
CHOL/HDL RATIO: 8
Cholesterol: 240 mg/dL — ABNORMAL HIGH (ref 0–200)
HDL: 31.3 mg/dL — ABNORMAL LOW (ref >39.00)
LDL CALC: 180 mg/dL — AB (ref 0–99)
NonHDL: 208.99
Triglycerides: 145 mg/dL (ref 0.0–149.0)
VLDL: 29 mg/dL (ref 0.0–40.0)

## 2017-08-15 LAB — BASIC METABOLIC PANEL
BUN: 20 mg/dL (ref 6–23)
CHLORIDE: 104 meq/L (ref 96–112)
CO2: 27 mEq/L (ref 19–32)
CREATININE: 1.2 mg/dL (ref 0.40–1.50)
Calcium: 9.2 mg/dL (ref 8.4–10.5)
GFR: 66.27 mL/min (ref >60.00)
GLUCOSE: 91 mg/dL (ref 70–99)
Potassium: 4.3 mEq/L (ref 3.5–5.1)
Sodium: 138 mEq/L (ref 135–145)

## 2017-08-15 LAB — HEPATIC FUNCTION PANEL
ALT: 19 U/L (ref 0–53)
AST: 15 U/L (ref 0–37)
Albumin: 4.5 g/dL (ref 3.5–5.2)
Alkaline Phosphatase: 66 U/L (ref 39–117)
BILIRUBIN DIRECT: 0.1 mg/dL (ref 0.0–0.3)
BILIRUBIN TOTAL: 0.7 mg/dL (ref 0.2–1.2)
Total Protein: 6.7 g/dL (ref 6.0–8.3)

## 2017-08-15 LAB — MAGNESIUM: Magnesium: 2 mg/dL (ref 1.5–2.5)

## 2017-08-15 LAB — TSH: TSH: 3.07 u[IU]/mL (ref 0.35–4.50)

## 2017-08-15 LAB — HEMOGLOBIN A1C: Hgb A1c MFr Bld: 5.3 % (ref 4.6–6.5)

## 2017-08-15 LAB — PSA: PSA: 1.83 ng/mL (ref 0.10–4.00)

## 2017-08-15 NOTE — Progress Notes (Signed)
Subjective:    Patient ID: Raymond Long, male    DOB: October 26, 1960, 57 y.o.   MRN: [id number]  HPI  Patient presents for yearly preventative medicine examination. He is a pleasant 57 year old male who  has a past medical history of Arthralgia of wrist, left, Cancer ([place]), Erectile dysfunction, Gout, Hyperlipidemia, Hypertension, Insomnia, and PONV (postoperative nausea and vomiting).  Insomnia - Was prescribed Seroquel 12.5 - 25 mg by Neurology. He feels as though he is sleeping better but feels as though " the medication makes me crazy." He has cut back to 6.25 mg QHS. He reports that he has since come off Seroquel due to side effects. He has since switched to OTC magnesium   Hyperlipidemia  - Has tried multiple statins in the past but did not respond well to them. Had "severe"  myalgias   RecentLabs       Lab Results  Component Value Date   CHOL 283 (H) 10/12/2015   HDL 33.10 (L) 10/12/2015   LDLCALC 212 (H) 10/12/2015   LDLDIRECT 176.9 06/09/2008   TRIG 187.0 (H) 10/12/2015   CHOLHDL 9 10/12/2015     Essential Hypertension -was placed on lisinopril during last office visit less than a month ago. BP Readings from Last 3 Encounters:  08/15/17 138/90  07/31/17 (!) 152/90  05/29/17 139/87    All immunizations and health maintenance protocols were reviewed with the patient and needed orders were placed.  Appropriate screening laboratory values were ordered for the patient including screening of hyperlipidemia, renal function and hepatic function. If indicated by BPH, a PSA was ordered.  Medication reconciliation,  past medical history, social history, problem list and allergies were reviewed in detail with the patient  Goals were established with regard to weight loss, exercise, and  diet in compliance with medications  He does do routine dental and vision screens.  Is up-to-date on his colonoscopy until December 2020   Review of Systems  Constitutional:  Negative.   HENT: Negative.   Eyes: Negative.   Respiratory: Negative.   Cardiovascular: Negative.   Gastrointestinal: Negative.   Endocrine: Negative.   Genitourinary: Negative.   Musculoskeletal: Negative.   Skin: Negative.   Allergic/Immunologic: Negative.   Neurological: Negative.   Hematological: Negative.   Psychiatric/Behavioral: Negative.   All other systems reviewed and are negative.  Past Medical History:  Diagnosis Date  . Arthralgia of wrist, left   . 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 Erectile dysfunction   . Gout    Foot  . Hyperlipidemia   . Hypertension   . Insomnia   . PONV (postoperative nausea and vomiting)     Social History   Socioeconomic History  . Marital status: Soil scientist    Spouse name: Not on file  . Number of children: Not on file  . Years of education: Not on file  . Highest education level: Not on file  Occupational History  . Not on file  Social Needs  . Financial resource strain: Not on file  . Food insecurity:    Worry: Not on file    Inability: Not on file  . Transportation needs:    Medical: Not on file    Non-medical: Not on file  Tobacco Use  . Smoking status: Former Smoker    Last attempt to quit: 01/09/2001    Years since quitting: 16.6  . Smokeless tobacco: Never Used  . Tobacco comment: 1978-2002, up to 2 ppd  Substance and  Sexual Activity  . 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 Drug use: No  . Sexual activity: Not on file  Lifestyle  . Physical activity:    Days per week: Not on file    Minutes per session: Not on file  . Stress: Not on file  Relationships  . Social connections:    Talks on phone: Not on file    Gets together: Not on file    Attends religious service: Not on file    Active member of club or organization: Not on file    Attends meetings of clubs or organizations: Not on file    Relationship status: Not on file  . Intimate partner violence:    Fear of current or ex partner: Not on file     Emotionally abused: Not on file    Physically abused: Not on file    Forced sexual activity: Not on file  Other Topics Concern  . Not on file  Social History Narrative   He works in Product manager   No kids       [place] to be outside and hike.           Past Surgical History:  Procedure Laterality Date  . COLONOSCOPY     @ 49 due to rectal symptoms  . HERNIA REPAIR  2008   Bilateral  . 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 MELANOMA EXCISION WITH SENTINEL LYMPH NODE BIOPSY Left 01/15/2013   Procedure: EXCISION LEFT LEG MELANOMA WITH LEFT INGUINAL SENTINEL NODE BIOPSY;  Surgeon: Rolm Bookbinder, MD;  Location: [place];  Service: General;  Laterality: Left;  . TONSILLECTOMY  age 27    Family History  Problem Relation Age of Onset  . Heart attack Mother        < 42  . Dementia Mother   . Heart attack Father 53  . COPD Sister   . Breast cancer Sister     Allergies  Allergen Reactions  . Atorvastatin   . Citalopram   . Ezetimibe-Simvastatin     REACTION: up cpk  . Fluoxetine Hcl   . Rosuvastatin     REACTION: up cpk  . Venlafaxine   . Seroquel [Quetiapine Fumarate] Palpitations    Racing thoughts and restless sleep  . Trazodone And Nefazodone Palpitations    Current Outpatient Medications on File Prior to Visit  Medication Sig Dispense Refill  . fluorouracil (EFUDEX) 5 % cream Apply 1 drop topically 2 (two) times daily.    . hydrOXYzine (ATARAX/VISTARIL) 25 MG tablet Take 25 mg by mouth at bedtime.    Marland Kitchen lisinopril (PRINIVIL,ZESTRIL) 5 MG tablet Take 1 tablet (5 mg total) by mouth daily. 30 tablet 1  . MAGNESIUM PO Take 1,000 mg elemental calcium/kg/hr by mouth daily.    . Melatonin 3 MG CAPS Take by mouth at bedtime.      . sildenafil (VIAGRA) 100 MG tablet Take 1 tablet (100 mg total) by mouth daily as needed. 6 tablet 5   No current facility-administered medications on file prior to visit.     BP  138/90   Temp 98.3 F (36.8 C) (Oral)   Ht 5' 11.25" (1.81 m)   Wt 201 lb (91.2 kg)   BMI 27.84 kg/m       Objective:   Physical Exam  Constitutional: He is oriented to person, place, and time. He appears well-developed and  well-nourished. No distress.  HENT:  Head: Normocephalic and atraumatic.  Right Ear: External ear normal.  Left Ear: External ear normal.  Nose: Nose normal.  Mouth/Throat: Oropharynx is clear and moist. No oropharyngeal exudate.  Eyes: Pupils are equal, round, and reactive to light. Conjunctivae and EOM are normal. Right eye exhibits no discharge. Left eye exhibits no discharge. No scleral icterus.  Neck: Normal range of motion. Neck supple. No JVD present. No tracheal deviation present. No thyromegaly present.  Cardiovascular: Normal rate, regular rhythm, normal heart sounds and intact distal pulses. Exam reveals no gallop and no friction rub.  No murmur heard. Pulmonary/Chest: Effort normal and breath sounds normal. No stridor. No respiratory distress. He has no wheezes. He has no rales. He exhibits no tenderness.  Abdominal: Soft. Bowel sounds are normal. He exhibits no distension and no mass. There is no tenderness. There is no rebound and no guarding.  Musculoskeletal: Normal range of motion. He exhibits no edema, tenderness or deformity.  Lymphadenopathy:    He has no cervical adenopathy.  Neurological: He is alert and oriented to person, place, and time. He has normal reflexes. He displays normal reflexes. No cranial nerve deficit. He exhibits normal muscle tone. Coordination normal.  Skin: Skin is warm and dry. No rash noted. He is not diaphoretic. No erythema. No pallor.  Psychiatric: He has a normal mood and affect. His behavior is normal. Judgment and thought content normal.  Nursing note and vitals reviewed.     Assessment & Plan:  1. Routine general medical examination at a health care facility - Diet and exercise  - Follow up in 1 year through  diet and exercise  - Basic metabolic panel - CBC with Differential/Platelet - Hemoglobin A1c - Hepatic function panel - Lipid panel - TSH - Magnesium  2. Insomnia, unspecified type - Continue with current regimen   3. Mixed hyperlipidemia - Cannot tolerate statins. Consider Vascepa or Tricor  - Basic metabolic panel - CBC with Differential/Platelet - Hemoglobin A1c - Hepatic function panel - Lipid panel - TSH  4. Essential hypertension - Better controlled. Will have him increase to 7.5  - Basic metabolic panel - CBC with Differential/Platelet - Hemoglobin A1c - Hepatic function panel - Lipid panel - TSH  5. Prostate cancer screening  - PSA  Dorothyann Peng, NP

## 2017-08-16 ENCOUNTER — Other Ambulatory Visit: Payer: Self-pay | Admitting: Adult Health

## 2017-08-16 MED ORDER — FENOFIBRATE 48 MG PO TABS: 48.0000 mg | ORAL_TABLET | Freq: Every day | ORAL | 0 refills | Status: DC

## 2017-09-14 ENCOUNTER — Other Ambulatory Visit: Payer: Self-pay | Admitting: Adult Health

## 2017-09-14 DIAGNOSIS — I10 Essential (primary) hypertension: Secondary | ICD-10-CM

## 2017-09-17 NOTE — Telephone Encounter (Signed)
4. Essential hypertension - Better controlled. Will have him increase to 7.5  - Basic metabolic panel - CBC with Differential/Platelet - Hemoglobin A1c - Hepatic function panel - Lipid panel - TSH   Should pt now be taking 1.5 tabs of lisinopril?

## 2017-09-17 NOTE — Telephone Encounter (Signed)
Sent to the pharmacy by e-scribe. 

## 2017-09-17 NOTE — Telephone Encounter (Signed)
Yes, is to 7.5 mg daily

## 2017-09-26 ENCOUNTER — Ambulatory Visit: Payer: 59 | Admitting: Neurology

## 2018-04-21 ENCOUNTER — Encounter: Payer: Self-pay | Admitting: Adult Health

## 2018-04-21 ENCOUNTER — Ambulatory Visit (INDEPENDENT_AMBULATORY_CARE_PROVIDER_SITE_OTHER): Payer: 59 | Admitting: Adult Health

## 2018-04-21 VITALS — BP 128/92 | Temp 98.0°F | Wt 199.0 lb

## 2018-04-21 DIAGNOSIS — F5101 Primary insomnia: Secondary | ICD-10-CM | POA: Diagnosis not present

## 2018-04-21 DIAGNOSIS — I10 Essential (primary) hypertension: Secondary | ICD-10-CM

## 2018-04-21 MED ORDER — AMLODIPINE BESYLATE 2.5 MG PO TABS: 2.5000 mg | ORAL_TABLET | Freq: Every day | ORAL | 0 refills | Status: DC

## 2018-04-21 MED ORDER — MIRTAZAPINE 7.5 MG PO TABS: 7.5000 mg | ORAL_TABLET | Freq: Every day | ORAL | 0 refills | Status: DC

## 2018-04-21 NOTE — Progress Notes (Signed)
Subjective:    Patient ID: Raymond Long, male    DOB: 03/13/1961, 57 y.o.   MRN: [id number]  HPI 57 year old male who  has a past medical history of Arthralgia of wrist, left, Cancer ([place]), Erectile dysfunction, Gout, Hyperlipidemia, Hypertension, Insomnia, and PONV (postoperative nausea and vomiting).  He presents to the office today for follow up regarding hypertension. He stopped taking his medication about 6-8 weeks because it made him feel " fatigued".  He has been monitoring his blood pressures at home and reports that he is consistently getting blood pressures in the 140s over 90s.  He denies any headaches, blurred vision, headedness or dizziness.  Continues to have issues with insomnia reports that he is only sleeping 3 to 4 hours a night.  Currently he is taking Atarax 25 mg and melatonin at nighttime this helps him get to sleep but he is not staying asleep.  In the past he seen by neurology and was prescribed Seroquel but felt as though " made me crazy".  With lack of sleep he is having mood swings.  He has been on many SSRIs in the past and did not tolerate them well, does not want to go back on any of these.   Review of Systems See HPI   Past Medical History:  Diagnosis Date  . Arthralgia of wrist, left   . 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 Erectile dysfunction   . Gout    Foot  . Hyperlipidemia   . Hypertension   . Insomnia   . PONV (postoperative nausea and vomiting)     Social History   Socioeconomic History  . Marital status: Soil scientist    Spouse name: Not on file  . Number of children: Not on file  . Years of education: Not on file  . Highest education level: Not on file  Occupational History  . Not on file  Social Needs  . Financial resource strain: Not on file  . Food insecurity:    Worry: Not on file    Inability: Not on file  . Transportation needs:    Medical: Not on file    Non-medical: Not on file  Tobacco Use  . Smoking status: Former Smoker      Last attempt to quit: 01/09/2001    Years since quitting: 17.2  . Smokeless tobacco: Never Used  . Tobacco comment: 1978-2002, up to 2 ppd  Substance and Sexual Activity  . 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 Drug use: No  . Sexual activity: Not on file  Lifestyle  . Physical activity:    Days per week: Not on file    Minutes per session: Not on file  . Stress: Not on file  Relationships  . Social connections:    Talks on phone: Not on file    Gets together: Not on file    Attends religious service: Not on file    Active member of club or organization: Not on file    Attends meetings of clubs or organizations: Not on file    Relationship status: Not on file  . Intimate partner violence:    Fear of current or ex partner: Not on file    Emotionally abused: Not on file    Physically abused: Not on file    Forced sexual activity: Not on file  Other Topics Concern  . Not on file  Social History Narrative   He  works in Product manager   No kids       38 to be outside and hike.           Past Surgical History:  Procedure Laterality Date  . COLONOSCOPY     @ 49 due to rectal symptoms  . HERNIA REPAIR  2008   Bilateral  . 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 MELANOMA EXCISION WITH SENTINEL LYMPH NODE BIOPSY Left 01/15/2013   Procedure: EXCISION LEFT LEG MELANOMA WITH LEFT INGUINAL SENTINEL NODE BIOPSY;  Surgeon: Rolm Bookbinder, MD;  Location: [place];  Service: General;  Laterality: Left;  . TONSILLECTOMY  age 89    Family History  Problem Relation Age of Onset  . Heart attack Mother        < 65  . Dementia Mother   . Heart attack Father 52  . COPD Sister   . Breast cancer Sister     Allergies  Allergen Reactions  . Atorvastatin   . Citalopram   . Ezetimibe-Simvastatin     REACTION: up cpk  . Fluoxetine Hcl   . Rosuvastatin     REACTION: up cpk  . Venlafaxine   . Seroquel [Quetiapine  Fumarate] Palpitations    Racing thoughts and restless sleep  . Trazodone And Nefazodone Palpitations    Current Outpatient Medications on File Prior to Visit  Medication Sig Dispense Refill  . fenofibrate (TRICOR) 48 MG tablet TAKE 1 TABLET ONCE DAILY. 90 tablet 3  . fluorouracil (EFUDEX) 5 % cream Apply 1 drop topically 2 (two) times daily.    . hydrOXYzine (ATARAX/VISTARIL) 25 MG tablet Take 25 mg by mouth at bedtime.    Marland Kitchen MAGNESIUM PO Take 1,000 mg elemental calcium/kg/hr by mouth daily.    . Melatonin 3 MG CAPS Take by mouth at bedtime.      . sildenafil (VIAGRA) 100 MG tablet Take 1 tablet (100 mg total) by mouth daily as needed. 6 tablet 5   No current facility-administered medications on file prior to visit.     BP (!) 128/92   Temp 98 F (36.7 C)   Wt 199 lb (90.3 kg)   BMI 27.56 kg/m       Objective:   Physical Exam Vitals signs and nursing note reviewed.  Constitutional:      Appearance: Normal appearance.  Neck:     Musculoskeletal: Normal range of motion.  Cardiovascular:     Rate and Rhythm: Normal rate and regular rhythm.     Pulses: Normal pulses.     Heart sounds: Normal heart sounds. No murmur. No friction rub.  Pulmonary:     Effort: Pulmonary effort is normal. No respiratory distress.     Breath sounds: Normal breath sounds. No stridor.  Skin:    General: Skin is warm and dry.     Capillary Refill: Capillary refill takes less than 2 seconds.  Neurological:     General: No focal deficit present.     Mental Status: He is alert. Mental status is at baseline.  Psychiatric:        Mood and Affect: Mood normal.        Behavior: Behavior normal.        Thought Content: Thought content normal.        Judgment: Judgment normal.       Assessment & Plan:  1. Essential hypertension - amLODipine (NORVASC) 2.5 MG tablet; Take 1 tablet (  2.5 mg total) by mouth daily.  Dispense: 30 tablet; Refill: 0 - Continue to monitor BP at home  - Return precautions  reviewed   2. Primary insomnia - mirtazapine (REMERON) 7.5 MG tablet; Take 1 tablet (7.5 mg total) by mouth at bedtime.  Dispense: 30 tablet; Refill: 0 - may help with mood swings as well - We discussed side effects in detail  - follow up in one month   Dorothyann Peng, NP

## 2018-05-20 ENCOUNTER — Encounter: Payer: Self-pay | Admitting: Adult Health

## 2018-05-20 ENCOUNTER — Ambulatory Visit (INDEPENDENT_AMBULATORY_CARE_PROVIDER_SITE_OTHER): Payer: 59 | Admitting: Adult Health

## 2018-05-20 DIAGNOSIS — I10 Essential (primary) hypertension: Secondary | ICD-10-CM | POA: Diagnosis not present

## 2018-05-20 DIAGNOSIS — F5101 Primary insomnia: Secondary | ICD-10-CM

## 2018-05-20 MED ORDER — AMLODIPINE BESYLATE 2.5 MG PO TABS: 2.5000 mg | ORAL_TABLET | Freq: Every day | ORAL | 3 refills | Status: DC

## 2018-05-20 MED ORDER — MIRTAZAPINE 7.5 MG PO TABS: 7.5000 mg | ORAL_TABLET | Freq: Every day | ORAL | 1 refills | Status: DC

## 2018-05-20 NOTE — Progress Notes (Signed)
Subjective:    Patient ID: Raymond Long, male    DOB: 1961-02-07, 58 y.o.   MRN: [id number]  HPI  58 year old male who  has a past medical history of Arthralgia of wrist, left, Cancer ([place]), Erectile dysfunction, Gout, Hyperlipidemia, Hypertension, Insomnia, and PONV (postoperative nausea and vomiting).  He presents to the office today for one month follow up regarding hypertension and insomnia.   When he was last seen he had stopped taking lisinopril as this made him feel fatigued. He was switched to Norvasc 2.5 mg. He has been monitoring his blood pressure at home and has been getting readings in the 120/80's. He denies side effects.   He was also started on remeron 7.5 mg for insomnia. Prior he was using melatonin and Atarax and was sleeping 3-4 hours a night. Since starting remeron he has significant improvement in his sleep and he is sleeping 7-8 hours a night. He also feels as though remeron is helping with overall well being and anxiety issues.   Review of Systems See HPI   Past Medical History:  Diagnosis Date  . Arthralgia of wrist, left   . 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 Erectile dysfunction   . Gout    Foot  . Hyperlipidemia   . Hypertension   . Insomnia   . PONV (postoperative nausea and vomiting)     Social History   Socioeconomic History  . Marital status: Soil scientist    Spouse name: Not on file  . Number of children: Not on file  . Years of education: Not on file  . Highest education level: Not on file  Occupational History  . Not on file  Social Needs  . Financial resource strain: Not on file  . Food insecurity:    Worry: Not on file    Inability: Not on file  . Transportation needs:    Medical: Not on file    Non-medical: Not on file  Tobacco Use  . Smoking status: Former Smoker    Last attempt to quit: 01/09/2001    Years since quitting: 17.3  . Smokeless tobacco: Never Used  . Tobacco comment: 1978-2002, up to 2 ppd  Substance and Sexual  Activity  . 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 Drug use: No  . Sexual activity: Not on file  Lifestyle  . Physical activity:    Days per week: Not on file    Minutes per session: Not on file  . Stress: Not on file  Relationships  . Social connections:    Talks on phone: Not on file    Gets together: Not on file    Attends religious service: Not on file    Active member of club or organization: Not on file    Attends meetings of clubs or organizations: Not on file    Relationship status: Not on file  . Intimate partner violence:    Fear of current or ex partner: Not on file    Emotionally abused: Not on file    Physically abused: Not on file    Forced sexual activity: Not on file  Other Topics Concern  . Not on file  Social History Narrative   He works in Product manager   No kids       [place] to be outside and hike.           Past Surgical History:  Procedure  Laterality Date  . COLONOSCOPY     @ 49 due to rectal symptoms  . HERNIA REPAIR  2008   Bilateral  . 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 MELANOMA EXCISION WITH SENTINEL LYMPH NODE BIOPSY Left 01/15/2013   Procedure: EXCISION LEFT LEG MELANOMA WITH LEFT INGUINAL SENTINEL NODE BIOPSY;  Surgeon: Rolm Bookbinder, MD;  Location: [facility];  Service: General;  Laterality: Left;  . TONSILLECTOMY  age 43    Family History  Problem Relation Age of Onset  . Heart attack Mother        < 71  . Dementia Mother   . Heart attack Father 85  . COPD Sister   . Breast cancer Sister     Allergies  Allergen Reactions  . Atorvastatin   . Citalopram   . Ezetimibe-Simvastatin     REACTION: up cpk  . Fluoxetine Hcl   . Rosuvastatin     REACTION: up cpk  . Venlafaxine   . Seroquel [Quetiapine Fumarate] Palpitations    Racing thoughts and restless sleep  . Trazodone And Nefazodone Palpitations    Current Outpatient Medications on File Prior to Visit    Medication Sig Dispense Refill  . fenofibrate (TRICOR) 48 MG tablet TAKE 1 TABLET ONCE DAILY. 90 tablet 3  . fluorouracil (EFUDEX) 5 % cream Apply 1 drop topically 2 (two) times daily.    . hydrOXYzine (ATARAX/VISTARIL) 25 MG tablet Take 25 mg by mouth at bedtime.    Marland Kitchen MAGNESIUM PO Take 1,000 mg elemental calcium/kg/hr by mouth daily.    . Melatonin 3 MG CAPS Take by mouth at bedtime.      . sildenafil (VIAGRA) 100 MG tablet Take 1 tablet (100 mg total) by mouth daily as needed. 6 tablet 5   No current facility-administered medications on file prior to visit.     BP 120/80   Temp 98.5 F (36.9 C)   Wt 197 lb (89.4 kg)   BMI 27.28 kg/m       Objective:   Physical Exam Vitals signs and nursing note reviewed.  Constitutional:      Appearance: Normal appearance.  Cardiovascular:     Rate and Rhythm: Normal rate and regular rhythm.     Pulses: Normal pulses.     Heart sounds: Normal heart sounds.  Pulmonary:     Effort: Pulmonary effort is normal.     Breath sounds: Normal breath sounds.  Abdominal:     General: Bowel sounds are normal.  Musculoskeletal: Normal range of motion.        General: No swelling, tenderness, deformity or signs of injury.  Skin:    General: Skin is warm and dry.     Capillary Refill: Capillary refill takes less than 2 seconds.  Neurological:     General: No focal deficit present.     Mental Status: He is alert and oriented to person, place, and time.  Psychiatric:        Mood and Affect: Mood normal.        Behavior: Behavior normal.        Thought Content: Thought content normal.        Judgment: Judgment normal.       Assessment & Plan:  1. Primary insomnia - no change  - mirtazapine (REMERON) 7.5 MG tablet; Take 1 tablet (7.5 mg total) by mouth at bedtime.  Dispense: 90 tablet; Refill: 1  2. Essential hypertension - no change  -  amLODipine (NORVASC) 2.5 MG tablet; Take 1 tablet (2.5 mg total) by mouth daily.  Dispense: 90 tablet;  Refill: 3   Dorothyann Peng, NP

## 2018-05-27 ENCOUNTER — Ambulatory Visit: Payer: 59 | Admitting: Adult Health

## 2018-08-01 ENCOUNTER — Telehealth: Payer: Self-pay | Admitting: Adult Health

## 2018-08-01 NOTE — Telephone Encounter (Signed)
Patient called requesting allopurinol rx.  He was told by his orthopedic that his uric acid levels were elevated.

## 2018-08-04 NOTE — Telephone Encounter (Signed)
Left a message for a call back.  Would like to set pt up for Webex.

## 2018-08-04 NOTE — Telephone Encounter (Signed)
Pt now scheduled for Webex with Cory for 08/05/2018.  Nothing further needed.

## 2018-08-05 ENCOUNTER — Encounter: Payer: Self-pay | Admitting: Adult Health

## 2018-08-05 ENCOUNTER — Ambulatory Visit (INDEPENDENT_AMBULATORY_CARE_PROVIDER_SITE_OTHER): Payer: 59 | Admitting: Adult Health

## 2018-08-05 ENCOUNTER — Other Ambulatory Visit: Payer: Self-pay

## 2018-08-05 DIAGNOSIS — M109 Gout, unspecified: Secondary | ICD-10-CM | POA: Diagnosis not present

## 2018-08-05 MED ORDER — ALLOPURINOL 100 MG PO TABS: 100.0000 mg | ORAL_TABLET | Freq: Every day | ORAL | 3 refills | Status: DC

## 2018-08-05 MED ORDER — METHYLPREDNISOLONE 4 MG PO TBPK
ORAL_TABLET | ORAL | 0 refills | Status: DC
Start: 2018-08-05 — End: 2018-09-03

## 2018-08-05 NOTE — Progress Notes (Signed)
Virtual Visit via Video Note  I connected with Raymond Long on 08/05/18 at  3:00 PM EDT by a video enabled telemedicine application and verified that I am speaking with the correct person using two identifiers.  Location patient: home Location provider:work or home office Persons participating in the virtual visit: patient, provider  I discussed the limitations of evaluation and management by telemedicine and the availability of in person appointments. The patient expressed understanding and agreed to proceed.   HPI: 58 year old male is being evaluated today for recurrent gout flares, this recent flare started 3 days ago in his right great toe.  His previous flare was approximately 6 weeks ago.  In the past he has been using tart cherry juice and turmeric but his gout flares are becoming more frequent and would like to try allopurinol.  He reports redness, pain, swelling, and warmth his right great toe.   ROS: See pertinent positives and negatives per HPI.  Past Medical History:  Diagnosis Date  . Arthralgia of wrist, left   . 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 Erectile dysfunction   . Gout    Foot  . Hyperlipidemia   . Hypertension   . Insomnia   . PONV (postoperative nausea and vomiting)     Past Surgical History:  Procedure Laterality Date  . COLONOSCOPY     @ 49 due to rectal symptoms  . HERNIA REPAIR  2008   Bilateral  . 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 TONSILLECTOMY  age 70    Family History  Problem Relation Age of Onset  . Heart attack Mother        < 61  . Dementia Mother   . Heart attack Father 69  . COPD Sister   . Breast cancer Sister       Current Outpatient Medications:  .   allopurinol (ZYLOPRIM) 100 MG tablet, Take 1 tablet (100 mg total) by mouth daily., Disp: 90 tablet, Rfl: 3 .  amLODipine (NORVASC) 2.5 MG tablet, Take 1 tablet (2.5 mg total) by mouth daily., Disp: 90 tablet, Rfl: 3 .  fenofibrate (TRICOR) 48 MG tablet, TAKE 1 TABLET ONCE DAILY., Disp: 90 tablet, Rfl: 3 .  fluorouracil (EFUDEX) 5 % cream, Apply 1 drop topically 2 (two) times daily., Disp: , Rfl:  .  hydrOXYzine (ATARAX/VISTARIL) 25 MG tablet, Take 25 mg by mouth at bedtime., Disp: , Rfl:  .  MAGNESIUM PO, Take 1,000 mg elemental calcium/kg/hr by mouth daily., Disp: , Rfl:  .  Melatonin 3 MG CAPS, Take by mouth at bedtime.  , Disp: , Rfl:  .  methylPREDNISolone (MEDROL DOSEPAK) 4 MG TBPK tablet, Take as directed, Disp: 21 tablet, Rfl: 0 .  mirtazapine (REMERON) 7.5 MG tablet, Take 1 tablet (7.5 mg total) by mouth at bedtime., Disp: 90 tablet, Rfl: 1 .  sildenafil (VIAGRA) 100 MG tablet, Take 1 tablet (100 mg total) by mouth daily as needed., Disp: 6 tablet, Rfl: 5  EXAM:  VITALS per patient if applicable:  GENERAL: alert, oriented, appears well and in no acute distress  HEENT: atraumatic, conjunttiva clear, no obvious abnormalities on inspection of external nose and ears  NECK: normal movements of the head and neck  LUNGS: on inspection no signs of respiratory distress, breathing rate appears normal, no obvious gross SOB, gasping or wheezing  CV: no obvious cyanosis  MS: moves all visible extremities without noticeable abnormality  PSYCH/NEURO: pleasant and cooperative, no obvious depression or anxiety, speech and thought processing grossly intact  Skin: Redness noted to the right great toe  ASSESSMENT AND PLAN:  Discussed the following assessment and plan:  Acute gout involving toe of right foot, unspecified cause - Plan: methylPREDNISolone (MEDROL DOSEPAK) 4 MG TBPK tablet, allopurinol (ZYLOPRIM) 100 MG tablet  Due to recurrent nature of gout flares.  We will have him start  allopurinol 100 mg daily.  Was advised to start this medication 2 weeks after his current flare has resolved.  Reviewed side effects of allopurinol with the patient.   Last kidney function WNL    I discussed the assessment and treatment plan with the patient. The patient was provided an opportunity to ask questions and all were answered. The patient agreed with the plan and demonstrated an understanding of the instructions.   The patient was advised to call back or seek an in-person evaluation if the symptoms worsen or if the condition fails to improve as anticipated.   Dorothyann Peng, NP

## 2018-09-03 ENCOUNTER — Ambulatory Visit (INDEPENDENT_AMBULATORY_CARE_PROVIDER_SITE_OTHER): Payer: 59 | Admitting: Family Medicine

## 2018-09-03 ENCOUNTER — Other Ambulatory Visit: Payer: Self-pay

## 2018-09-03 ENCOUNTER — Encounter: Payer: Self-pay | Admitting: Family Medicine

## 2018-09-03 ENCOUNTER — Ambulatory Visit: Payer: Self-pay

## 2018-09-03 DIAGNOSIS — M109 Gout, unspecified: Secondary | ICD-10-CM | POA: Diagnosis not present

## 2018-09-03 MED ORDER — PREDNISONE 10 MG PO TABS
ORAL_TABLET | ORAL | 0 refills | Status: DC
Start: 2018-09-03 — End: 2018-09-03

## 2018-09-03 MED ORDER — PREDNISONE 10 MG PO TABS: ORAL_TABLET | ORAL | 0 refills | Status: DC

## 2018-09-03 NOTE — Progress Notes (Signed)
Subjective:    Patient ID: Raymond Long, male    DOB: [birth year], 58 y.o.   MRN: [id number]  HPI Virtual Visit via Video Note  I connected with the patient on 09/03/18 at 11:15 AM EDT by a video enabled telemedicine application and verified that I am speaking with the correct person using two identifiers.  Location patient: home Location provider:work or home office Persons participating in the virtual visit: patient, provider  I discussed the limitations of evaluation and management by telemedicine and the availability of in person appointments. The patient expressed understanding and agreed to proceed.   HPI: Here to discuss several days of swelling in the right ankle and heel. This is extremely painful. The skin is red and warm to the touch. It acts like gout, although hr has never had gout in this location before. He had a virtual visit with Dorothyann Peng, his PCP, on 08-05-18 for a typical gout flare in the right great toe. He was treated with a Medrol dose pack, and this worked well for him. They also spoke about trying Allopurinol since he has never used this before. He was getting frequent gout attack, despite taking daily tart cherry extract and turmeric. The last uric acid level I see in his chart was 8.6 in 2016. He did start on Allopurinol 100 mg daily but he had to stop taking this after only one week because he experienced intense itching all over his body. No visible rash. This itching has now resolved.    ROS: See pertinent positives and negatives per HPI.  Past Medical History:  Diagnosis Date  . Arthralgia of wrist, left   . 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 Erectile dysfunction   . Gout    Foot  . Hyperlipidemia   . Hypertension   . Insomnia   . PONV (postoperative nausea and vomiting)     Past Surgical History:  Procedure Laterality Date  . COLONOSCOPY     @ 49 due to rectal symptoms  . HERNIA REPAIR  2008   Bilateral  . MELANOMA EXCISION  12/13/2015   Located  behind right ear  . 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 TONSILLECTOMY  age 70    Family History  Problem Relation Age of Onset  . Heart attack Mother        < 63  . Dementia Mother   . Heart attack Father 66  . COPD Sister   . Breast cancer Sister      Current Outpatient Medications:  .  amLODipine (NORVASC) 2.5 MG tablet, Take 1 tablet (2.5 mg total) by mouth daily., Disp: 90 tablet, Rfl: 3 .  fenofibrate (TRICOR) 48 MG tablet, TAKE 1 TABLET ONCE DAILY., Disp: 90 tablet, Rfl: 3 .  fluorouracil (EFUDEX) 5 % cream, Apply 1 drop topically 2 (two) times daily., Disp: , Rfl:  .  hydrOXYzine (ATARAX/VISTARIL) 25 MG tablet, Take 25 mg by mouth at bedtime., Disp: , Rfl:  .  MAGNESIUM PO, Take 1,000 mg elemental calcium/kg/hr by mouth daily., Disp: , Rfl:  .  Melatonin 3 MG CAPS, Take by mouth at bedtime.  , Disp: , Rfl:  .  sildenafil (VIAGRA) 100 MG tablet, Take 1 tablet (100 mg total) by mouth daily as needed., Disp: 6 tablet,  Rfl: 5 .  mirtazapine (REMERON) 7.5 MG tablet, Take 1 tablet (7.5 mg total) by mouth at bedtime., Disp: 90 tablet, Rfl: 1 .  predniSONE (DELTASONE) 10 MG tablet, Take 5 tabs a day for 3 days, then 4 a day for 3 days, then 3 a day for 3 days, then 2 a day for 3 days, then 1 a day for 3 days, then stop, Disp: 45 tablet, Rfl: 0  EXAM:  VITALS per patient if applicable:  GENERAL: alert, oriented, appears well and in no acute distress  HEENT: atraumatic, conjunttiva clear, no obvious abnormalities on inspection of external nose and ears  NECK: normal movements of the head and neck  LUNGS: on inspection no signs of respiratory distress, breathing rate appears normal, no obvious gross SOB, gasping or wheezing  CV: no obvious cyanosis  MS: moves  all visible extremities without noticeable abnormality  PSYCH/NEURO: pleasant and cooperative, no obvious depression or anxiety, speech and thought processing grossly intact  ASSESSMENT AND PLAN: He is having another acute gout attack, and this time we will treat it with a 15 day taper of Prednisone. He will follow up as needed. Perhaps he can discuss trying another preventative medication like Uloric with Cory at some point.  Alysia Penna, MD  Discussed the following assessment and plan:  No diagnosis found.     I discussed the assessment and treatment plan with the patient. The patient was provided an opportunity to ask questions and all were answered. The patient agreed with the plan and demonstrated an understanding of the instructions.   The patient was advised to call back or seek an in-person evaluation if the symptoms worsen or if the condition fails to improve as anticipated.     Review of Systems     Objective:   Physical Exam        Assessment & Plan:

## 2018-09-03 NOTE — Telephone Encounter (Signed)
Patient scheduled to see Dr. Sarajane Jews at 11:15 AM today. No further action needed

## 2018-09-03 NOTE — Telephone Encounter (Signed)
Patient called and says since taking Allopurinol, he developed a rash all over and his right ankle, heel is red and swollen so much he can hardly walk and cannot wear a shoe. He say she stopped taking the Allopurinol 3 days ago and the itching has gotten better, but the swelling is still there and it started on Sunday. He says the pain is so severe it almost brought him to tears. He denies any other symptoms except mild calf pain due to some swelling up his right leg. I called the office and spoke to [facility] who asked to speak to the patient, the call was connected successfully.  Answer Assessment - Initial Assessment Questions 1. LOCATION: "Which joint is swollen?"     Right ankle, heel 2. ONSET: "When did the swelling start?"     Sunday 3. SIZE: "How large is the swelling?"     Unable to put shoe on, hard to walk 4. PAIN: "Is there any pain?" If so, ask: "How bad is it?" (Scale 1-10; or mild, moderate, severe)     Severe pain 5. CAUSE: "What do you think caused the swollen joint?"     Allopurinol 6. OTHER SYMPTOMS: "Do you have any other symptoms?" (e.g., fever, chest pain, difficulty breathing, calf pain)     A little calf pain due to swelling 7. PREGNANCY: "Is there any chance you are pregnant?" "When was your last menstrual period?"     N/A  Protocols used: ANKLE SWELLING-A-AH

## 2018-09-17 ENCOUNTER — Ambulatory Visit (INDEPENDENT_AMBULATORY_CARE_PROVIDER_SITE_OTHER): Payer: 59 | Admitting: Adult Health

## 2018-09-17 ENCOUNTER — Telehealth: Payer: Self-pay | Admitting: *Deleted

## 2018-09-17 ENCOUNTER — Other Ambulatory Visit: Payer: Self-pay

## 2018-09-17 ENCOUNTER — Encounter: Payer: Self-pay | Admitting: Adult Health

## 2018-09-17 DIAGNOSIS — M1A0710 Idiopathic chronic gout, right ankle and foot, without tophus (tophi): Secondary | ICD-10-CM

## 2018-09-17 MED ORDER — INDOMETHACIN 50 MG PO CAPS
50.0000 mg | ORAL_CAPSULE | Freq: Two times a day (BID) | ORAL | 0 refills | Status: DC
Start: 2018-09-17 — End: 2019-03-10

## 2018-09-17 MED ORDER — FEBUXOSTAT 40 MG PO TABS: 40.0000 mg | ORAL_TABLET | Freq: Every day | ORAL | 0 refills | Status: DC

## 2018-09-17 NOTE — Telephone Encounter (Signed)
Yes please

## 2018-09-17 NOTE — Telephone Encounter (Signed)
Copied from [place] [phone]. Topic: General - Other >> Sep 17, 2018  8:11 AM Oneta Rack wrote: Patient was seen 09/03/2018 by [name] for right ankle gout. Once prednisone completed symptoms came back patient requesting refill due to swollen right ankle persisting, please advise  [place], [place] [phone] (Phone) [phone] (Fax)

## 2018-09-17 NOTE — Progress Notes (Signed)
Virtual Visit via Video Note  I connected with Raymond Long on 09/17/18 at 11:30 AM EDT by a video enabled telemedicine application and verified that I am speaking with the correct person using two identifiers.  Location patient: home Location provider:work or home office Persons participating in the virtual visit: patient, provider  I discussed the limitations of evaluation and management by telemedicine and the availability of in person appointments. The patient expressed understanding and agreed to proceed.   HPI:  58 year old male who is being evaluated today for chronic issue of gout.  Was last seen by me on 08/05/2018 for an acute gout flare, he was started on prednisone And then advised to start allopurinol approximately 2 weeks later.  Then seen by Dr. Sarajane Jews on 09/03/2018 or swelling in the right ankle and heel.  He thought it was another acute gout flare.  At this time he reported that he started allopurinol but that he had to stop taking it after he experienced intense itching all over his body, upon stopping allopurinol the itching resolved.  At this time he was started on a 15-day course of prednisone.  Today he reports that he has since finished his prednisone course currently does not have any acute flares but is wondering what he can do to prevent any additional flares in the future.  ROS: See pertinent positives and negatives per HPI.  Past Medical History:  Diagnosis Date  . Arthralgia of wrist, left   . 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 Erectile dysfunction   . Gout    Foot  . Hyperlipidemia   . Hypertension   . Insomnia   . PONV (postoperative nausea and vomiting)     Past Surgical History:  Procedure Laterality Date  . COLONOSCOPY     @ 49 due to rectal symptoms  . HERNIA REPAIR  2008   Bilateral  . 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 MELANOMA EXCISION WITH SENTINEL LYMPH NODE BIOPSY Left 01/15/2013   Procedure: EXCISION LEFT LEG MELANOMA WITH  LEFT INGUINAL SENTINEL NODE BIOPSY;  Surgeon: Rolm Bookbinder, MD;  Location: [place];  Service: General;  Laterality: Left;  . TONSILLECTOMY  age 22    Family History  Problem Relation Age of Onset  . Heart attack Mother        < 73  . Dementia Mother   . Heart attack Father 5  . COPD Sister   . Breast cancer Sister        Current Outpatient Medications:  .  amLODipine (NORVASC) 2.5 MG tablet, Take 1 tablet (2.5 mg total) by mouth daily., Disp: 90 tablet, Rfl: 3 .  febuxostat (ULORIC) 40 MG tablet, Take 1 tablet (40 mg total) by mouth daily., Disp: 90 tablet, Rfl: 0 .  fenofibrate (TRICOR) 48 MG tablet, TAKE 1 TABLET ONCE DAILY., Disp: 90 tablet, Rfl: 3 .  fluorouracil (EFUDEX) 5 % cream, Apply 1 drop topically 2 (two) times daily., Disp: , Rfl:  .  hydrOXYzine (ATARAX/VISTARIL) 25 MG tablet, Take 25 mg by mouth at bedtime., Disp: , Rfl:  .  indomethacin (INDOCIN) 50 MG capsule, Take 1 capsule (50 mg total) by mouth 2 (two) times daily with a meal., Disp: 60 capsule, Rfl: 0 .  MAGNESIUM PO, Take 1,000 mg elemental calcium/kg/hr by mouth daily., Disp: , Rfl:  .  Melatonin 3 MG CAPS, Take by mouth at bedtime.  , Disp: , Rfl:  .  mirtazapine (REMERON)  7.5 MG tablet, Take 1 tablet (7.5 mg total) by mouth at bedtime., Disp: 90 tablet, Rfl: 1 .  predniSONE (DELTASONE) 10 MG tablet, Take 5 tabs a day for 3 days, then 4 a day for 3 days, then 3 a day for 3 days, then 2 a day for 3 days, then 1 a day for 3 days, then stop, Disp: 45 tablet, Rfl: 0 .  sildenafil (VIAGRA) 100 MG tablet, Take 1 tablet (100 mg total) by mouth daily as needed., Disp: 6 tablet, Rfl: 5  EXAM:  VITALS per patient if applicable:  GENERAL: alert, oriented, appears well and in no acute distress  HEENT: atraumatic, conjunttiva clear, no obvious abnormalities on inspection of external nose and ears  NECK: normal movements of the head and neck  LUNGS: on inspection no signs of respiratory  distress, breathing rate appears normal, no obvious gross SOB, gasping or wheezing  CV: no obvious cyanosis  MS: moves all visible extremities without noticeable abnormality  PSYCH/NEURO: pleasant and cooperative, no obvious depression or anxiety, speech and thought processing grossly intact  ASSESSMENT AND PLAN:  Discussed the following assessment and plan:  We will trial on Uloric 40 mg daily.  We will also place him on Indocin 50 mg twice daily for 30 days while starting Uloric.  He was advised to follow-up with any side effects.  Chronic gout of right ankle, unspecified cause - Plan: indomethacin (INDOCIN) 50 MG capsule, febuxostat (ULORIC) 40 MG tablet, CBC with Differential/Platelet, Uric Acid     I discussed the assessment and treatment plan with the patient. The patient was provided an opportunity to ask questions and all were answered. The patient agreed with the plan and demonstrated an understanding of the instructions.   The patient was advised to call back or seek an in-person evaluation if the symptoms worsen or if the condition fails to improve as anticipated.   Dorothyann Peng, NP

## 2018-09-17 NOTE — Telephone Encounter (Signed)
Pt now scheduled to see [name] today at 11:30.  Nothing further needed.

## 2018-09-17 NOTE — Telephone Encounter (Signed)
LMOM.  Needs virtual appt with [facility].

## 2018-09-18 ENCOUNTER — Other Ambulatory Visit: Payer: Self-pay

## 2018-09-18 ENCOUNTER — Other Ambulatory Visit (INDEPENDENT_AMBULATORY_CARE_PROVIDER_SITE_OTHER): Payer: 59

## 2018-09-18 DIAGNOSIS — M1A0710 Idiopathic chronic gout, right ankle and foot, without tophus (tophi): Secondary | ICD-10-CM | POA: Diagnosis not present

## 2018-09-19 LAB — CBC WITH DIFFERENTIAL/PLATELET
Basophils Absolute: 0.1 10*3/uL (ref 0.0–0.1)
Basophils Relative: 0.6 % (ref 0.0–3.0)
Eosinophils Absolute: 0.1 10*3/uL (ref 0.0–0.7)
Eosinophils Relative: 0.6 % (ref 0.0–5.0)
HCT: 45.3 % (ref 39.0–52.0)
Hemoglobin: 15.8 g/dL (ref 13.0–17.0)
Lymphocytes Relative: 23.5 % (ref 12.0–46.0)
Lymphs Abs: 2.1 10*3/uL (ref 0.7–4.0)
MCHC: 34.9 g/dL (ref 30.0–36.0)
MCV: 94.2 fl (ref 78.0–100.0)
Monocytes Absolute: 0.8 10*3/uL (ref 0.1–1.0)
Monocytes Relative: 9.2 % (ref 3.0–12.0)
Neutro Abs: 5.9 10*3/uL (ref 1.4–7.7)
Neutrophils Relative %: 66.1 % (ref 43.0–77.0)
Platelets: 146 10*3/uL — ABNORMAL LOW (ref 150.0–400.0)
RBC: 4.81 Mil/uL (ref 4.22–5.81)
RDW: 13.7 % (ref 11.5–15.5)
WBC: 8.9 10*3/uL (ref 4.0–10.5)

## 2018-09-19 LAB — URIC ACID: Uric Acid, Serum: 7.5 mg/dL (ref 4.0–7.8)

## 2018-09-24 ENCOUNTER — Telehealth: Payer: Self-pay | Admitting: Family Medicine

## 2018-09-24 NOTE — Telephone Encounter (Signed)
Spoke to the pt.  He said a prescription for the Uloric is $100 for a 30 day supply with [facility].  Is there something else he can take?  Has also started having "hallucinogenic dreams."  Could that be from the Uloric?

## 2018-09-24 NOTE — Telephone Encounter (Signed)
I have not heard about that side effect with Uloric....   There is nothing else that we would use in primary care. Since he is getting frequent flares, we can sent to Rheumatology for further evaluation

## 2018-09-25 NOTE — Telephone Encounter (Signed)
Left a message for a return call.

## 2018-09-30 NOTE — Telephone Encounter (Signed)
Left a message for a return call.

## 2018-09-30 NOTE — Telephone Encounter (Signed)
Patient says he will continue the regimen and call back if he wants to try rheumatology.

## 2018-11-10 ENCOUNTER — Other Ambulatory Visit: Payer: Self-pay | Admitting: Adult Health

## 2018-11-10 DIAGNOSIS — F5101 Primary insomnia: Secondary | ICD-10-CM

## 2018-11-12 NOTE — Telephone Encounter (Signed)
DUE FOR CPX.

## 2018-11-12 NOTE — Telephone Encounter (Signed)
Left a message for a return call.  Needs cpx

## 2018-11-13 NOTE — Telephone Encounter (Signed)
Raymond Long, Amber L 11/12/2018 04:03 PM  Summary: returning call    Pt would like to speak with [facility] about his medications.  States that he is returning her call.

## 2019-01-05 DIAGNOSIS — Z96642 Presence of left artificial hip joint: Secondary | ICD-10-CM | POA: Insufficient documentation

## 2019-02-02 ENCOUNTER — Other Ambulatory Visit: Payer: Self-pay | Admitting: Adult Health

## 2019-02-02 DIAGNOSIS — F5101 Primary insomnia: Secondary | ICD-10-CM

## 2019-02-04 NOTE — Telephone Encounter (Signed)
Sent to the pharmacy by e-scribe for 90 days.  Pt has upcoming cpx. 

## 2019-03-06 ENCOUNTER — Telehealth: Payer: Self-pay | Admitting: Adult Health

## 2019-03-06 NOTE — Telephone Encounter (Signed)
Pt called stating this is the second rescheduling by the provider. Pt states he is needing to be seen before the end of his insurance on 05/07/2019. 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 Please advise.

## 2019-03-06 NOTE — Telephone Encounter (Signed)
You can put him wherever

## 2019-03-06 NOTE — Telephone Encounter (Signed)
LMVM to reschedule appointment. Raymond Long will be out of the office November 5th, 6th, 10th and 11th.  His next available physical slot is not till February

## 2019-03-10 ENCOUNTER — Ambulatory Visit (INDEPENDENT_AMBULATORY_CARE_PROVIDER_SITE_OTHER): Payer: 59 | Admitting: Adult Health

## 2019-03-10 ENCOUNTER — Encounter: Payer: Self-pay | Admitting: Adult Health

## 2019-03-10 ENCOUNTER — Other Ambulatory Visit: Payer: Self-pay

## 2019-03-10 VITALS — BP 132/86 | Temp 97.9°F | Wt 204.0 lb

## 2019-03-10 DIAGNOSIS — F5101 Primary insomnia: Secondary | ICD-10-CM

## 2019-03-10 MED ORDER — MIRTAZAPINE 15 MG PO TABS: 15.0000 mg | ORAL_TABLET | Freq: Every day | ORAL | 0 refills | Status: DC

## 2019-03-10 NOTE — Telephone Encounter (Signed)
Pt seen 03/10/2019

## 2019-03-10 NOTE — Progress Notes (Deleted)
Subjective:    Patient ID: Raymond Long, male    DOB: 1961/05/01, 58 y.o.   MRN: [id number]  HPI  Patient presents for yearly preventative medicine examination. He is a pleasant 58 year old male who  has a past medical history of Arthralgia of wrist, left, Cancer ([place]), Erectile dysfunction, Gout, Hyperlipidemia, Hypertension, Insomnia, and PONV (postoperative nausea and vomiting).  Hyperlipidemia -intolerant to multiple statins in the past.  Currently prescribed TriCor 48 mg  Lab Results  Component Value Date   CHOL 240 (H) 08/15/2017   HDL 31.30 (L) 08/15/2017   LDLCALC 180 (H) 08/15/2017   LDLDIRECT 176.9 06/09/2008   TRIG 145.0 08/15/2017   CHOLHDL 8 08/15/2017    Insomnia -well controlled with Remeron 7.5 mg  Essential Hypertension -well controlled with Norvasc 2.5 mg daily BP Readings from Last 3 Encounters:  05/20/18 120/80  04/21/18 (!) 128/92  08/15/17 138/90   H/o Melanoma -  All immunizations and health maintenance protocols were reviewed with the patient and needed orders were placed. Due for seasonal flu vaccination  Appropriate screening laboratory values were ordered for the patient including screening of hyperlipidemia, renal function and hepatic function. If indicated by BPH, a PSA was ordered.  Medication reconciliation,  past medical history, social history, problem list and allergies were reviewed in detail with the patient  Goals were established with regard to weight loss, exercise, and  diet in compliance with medications Wt Readings from Last 3 Encounters:  05/20/18 197 lb (89.4 kg)  04/21/18 199 lb (90.3 kg)  08/15/17 201 lb (91.2 kg)    He is up-to-date on routine screening exams such as colonoscopy, dental, and vision screens.   Review of Systems  Constitutional: Negative.   HENT: Negative.   Eyes: Negative.   Respiratory: Negative.   Cardiovascular: Negative.   Gastrointestinal: Negative.   Endocrine: Negative.   Genitourinary:  Negative.   Musculoskeletal: Negative.   Skin: Negative.   Allergic/Immunologic: Negative.   Neurological: Negative.   Hematological: Negative.   Psychiatric/Behavioral: Negative.   All other systems reviewed and are negative.  Past Medical History:  Diagnosis Date  . Arthralgia of wrist, left   . 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 Erectile dysfunction   . Gout    Foot  . Hyperlipidemia   . Hypertension   . Insomnia   . PONV (postoperative nausea and vomiting)     Social History   Socioeconomic History  . Marital status: Soil scientist    Spouse name: Not on file  . Number of children: Not on file  . Years of education: Not on file  . Highest education level: Not on file  Occupational History  . Not on file  Social Needs  . Financial resource strain: Not on file  . Food insecurity    Worry: Not on file    Inability: Not on file  . Transportation needs    Medical: Not on file    Non-medical: Not on file  Tobacco Use  . Smoking status: Former Smoker    Quit date: 01/09/2001    Years since quitting: 18.1  . Smokeless tobacco: Never Used  . Tobacco comment: 1978-2002, up to 2 ppd  Substance and Sexual Activity  . 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 Drug use: No  . Sexual activity: Not on file  Lifestyle  . Physical activity    Days per week: Not on file    Minutes  per session: Not on file  . Stress: Not on file  Relationships  . Social Herbalist on phone: Not on file    Gets together: Not on file    Attends religious service: Not on file    Active member of club or organization: Not on file    Attends meetings of clubs or organizations: Not on file    Relationship status: Not on file  . Intimate partner violence    Fear of current or ex partner: Not on file    Emotionally abused: Not on file    Physically abused: Not on file    Forced sexual activity: Not on file  Other Topics Concern  . Not on file  Social History Narrative   He works in  Product manager   No kids       [place] to be outside and hike.           Past Surgical History:  Procedure Laterality Date  . COLONOSCOPY     @ 49 due to rectal symptoms  . HERNIA REPAIR  2008   Bilateral  . 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 MELANOMA EXCISION WITH SENTINEL LYMPH NODE BIOPSY Left 01/15/2013   Procedure: EXCISION LEFT LEG MELANOMA WITH LEFT INGUINAL SENTINEL NODE BIOPSY;  Surgeon: Rolm Bookbinder, MD;  Location: [facility];  Service: General;  Laterality: Left;  . TONSILLECTOMY  age 34    Family History  Problem Relation Age of Onset  . Heart attack Mother        < 6  . Dementia Mother   . Heart attack Father 37  . COPD Sister   . Breast cancer Sister     Allergies  Allergen Reactions  . Allopurinol     Itching, redness, scaling to his skin and swelling in the ankle  . Atorvastatin   . Citalopram   . Ezetimibe-Simvastatin     REACTION: up cpk  . Fluoxetine Hcl   . Rosuvastatin     REACTION: up cpk  . Venlafaxine   . Seroquel [Quetiapine Fumarate] Palpitations    Racing thoughts and restless sleep  . Trazodone And Nefazodone Palpitations    Current Outpatient Medications on File Prior to Visit  Medication Sig Dispense Refill  . amLODipine (NORVASC) 2.5 MG tablet Take 1 tablet (2.5 mg total) by mouth daily. 90 tablet 3  . febuxostat (ULORIC) 40 MG tablet Take 1 tablet (40 mg total) by mouth daily. 90 tablet 0  . fenofibrate (TRICOR) 48 MG tablet TAKE 1 TABLET ONCE DAILY. 90 tablet 3  . fluorouracil (EFUDEX) 5 % cream Apply 1 drop topically 2 (two) times daily.    . hydrOXYzine (ATARAX/VISTARIL) 25 MG tablet Take 25 mg by mouth at bedtime.    . indomethacin (INDOCIN) 50 MG capsule Take 1 capsule (50 mg total) by mouth 2 (two) times daily with a meal. 60 capsule 0  . MAGNESIUM PO Take 1,000 mg elemental calcium/kg/hr by mouth daily.    . Melatonin 3 MG CAPS Take by mouth at bedtime.      .  mirtazapine (REMERON) 7.5 MG tablet TAKE ONE TABLET BY MOUTH EVERY NIGHT AT BEDTIME 30 tablet 1  . predniSONE (DELTASONE) 10 MG tablet Take 5 tabs a day for 3 days, then 4 a day for 3 days, then 3 a day for 3 days, then 2 a day for 3 days, then 1 a  day for 3 days, then stop 45 tablet 0  . sildenafil (VIAGRA) 100 MG tablet Take 1 tablet (100 mg total) by mouth daily as needed. 6 tablet 5   No current facility-administered medications on file prior to visit.     There were no vitals taken for this visit.      Objective:   Physical Exam Vitals signs and nursing note reviewed.  Constitutional:      General: He is not in acute distress.    Appearance: Normal appearance. He is well-developed. He is not diaphoretic.  HENT:     Head: Normocephalic and atraumatic.     Right Ear: Tympanic membrane, ear canal and external ear normal. There is no impacted cerumen.     Left Ear: Tympanic membrane, ear canal and external ear normal. There is no impacted cerumen.     Nose: Nose normal. No congestion or rhinorrhea.     Mouth/Throat:     Mouth: Mucous membranes are moist.     Pharynx: Oropharynx is clear. No oropharyngeal exudate or posterior oropharyngeal erythema.  Eyes:     General:        Right eye: No discharge.        Left eye: No discharge.     Conjunctiva/sclera: Conjunctivae normal.     Pupils: Pupils are equal, round, and reactive to light.  Neck:     Musculoskeletal: Normal range of motion and neck supple.     Thyroid: No thyromegaly.     Trachea: No tracheal deviation.  Cardiovascular:     Rate and Rhythm: Normal rate and regular rhythm.     Pulses: Normal pulses.     Heart sounds: Normal heart sounds. No murmur. No friction rub. No gallop.   Pulmonary:     Effort: Pulmonary effort is normal. No respiratory distress.     Breath sounds: Normal breath sounds. No stridor. No wheezing, rhonchi or rales.  Chest:     Chest wall: No tenderness.  Abdominal:     General: Bowel sounds  are normal. There is no distension.     Palpations: Abdomen is soft. There is no mass.     Tenderness: There is no abdominal tenderness. There is no right CVA tenderness, left CVA tenderness, guarding or rebound.     Hernia: No hernia is present.  Musculoskeletal: Normal range of motion.        General: No swelling, tenderness, deformity or signs of injury.     Right lower leg: No edema.     Left lower leg: No edema.  Lymphadenopathy:     Cervical: No cervical adenopathy.  Skin:    General: Skin is warm and dry.     Coloration: Skin is not jaundiced or pale.     Findings: No bruising, erythema, lesion or rash.  Neurological:     General: No focal deficit present.     Mental Status: He is alert and oriented to person, place, and time.     Cranial Nerves: No cranial nerve deficit.     Sensory: No sensory deficit.     Motor: No weakness.     Coordination: Coordination normal.     Gait: Gait normal.     Deep Tendon Reflexes: Reflexes normal.  Psychiatric:        Mood and Affect: Mood normal.        Behavior: Behavior normal.        Thought Content: Thought content normal.  Judgment: Judgment normal.       Assessment & Plan:

## 2019-03-10 NOTE — Progress Notes (Signed)
Subjective:    Patient ID: Raymond Long, male    DOB: 18-Aug-1960, 58 y.o.   MRN: [id number]  HPI  58 year old male who  has a past medical history of Arthralgia of wrist, left, Cancer ([place]), Erectile dysfunction, Gout, Hyperlipidemia, Hypertension, Insomnia, and PONV (postoperative nausea and vomiting).  He presents to the office today for follow up regarding insomnia. He is currently prescribed Remeron 7.5 mg and this was doing very well for him until he had hip surgery a few months ago and was also started on chronic prednisone therapy by Rheumatology for gout/ psoriatic arthritis. Since this time he has been unable to sleep well. He is having trouble falling asleep and staying asleep.   He is doing well post/op hip surgery.   Review of Systems See HPI   Past Medical History:  Diagnosis Date  . Arthralgia of wrist, left   . 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 Erectile dysfunction   . Gout    Foot  . Hyperlipidemia   . Hypertension   . Insomnia   . PONV (postoperative nausea and vomiting)     Social History   Socioeconomic History  . Marital status: Soil scientist    Spouse name: Not on file  . Number of children: Not on file  . Years of education: Not on file  . Highest education level: Not on file  Occupational History  . Not on file  Social Needs  . Financial resource strain: Not on file  . Food insecurity    Worry: Not on file    Inability: Not on file  . Transportation needs    Medical: Not on file    Non-medical: Not on file  Tobacco Use  . Smoking status: Former Smoker    Quit date: 01/09/2001    Years since quitting: 18.1  . Smokeless tobacco: Never Used  . Tobacco comment: 1978-2002, up to 2 ppd  Substance and Sexual Activity  . 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 Drug use: No  . Sexual activity: Not on file  Lifestyle  . Physical activity    Days per week: Not on file    Minutes per session: Not on file  . Stress: Not on file  Relationships  .  Social Herbalist on phone: Not on file    Gets together: Not on file    Attends religious service: Not on file    Active member of club or organization: Not on file    Attends meetings of clubs or organizations: Not on file    Relationship status: Not on file  . Intimate partner violence    Fear of current or ex partner: Not on file    Emotionally abused: Not on file    Physically abused: Not on file    Forced sexual activity: Not on file  Other Topics Concern  . Not on file  Social History Narrative   He works in Product manager   No kids       [place] to be outside and hike.           Past Surgical History:  Procedure Laterality Date  . COLONOSCOPY     @ 49 due to rectal symptoms  . HERNIA REPAIR  2008   Bilateral  . 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 MELANOMA EXCISION WITH SENTINEL LYMPH NODE BIOPSY  Left 01/15/2013   Procedure: EXCISION LEFT LEG MELANOMA WITH LEFT INGUINAL SENTINEL NODE BIOPSY;  Surgeon: Rolm Bookbinder, MD;  Location: [facility];  Service: General;  Laterality: Left;  . TONSILLECTOMY  age 2    Family History  Problem Relation Age of Onset  . Heart attack Mother        < 7  . Dementia Mother   . Heart attack Father 44  . COPD Sister   . Breast cancer Sister     Allergies  Allergen Reactions  . Allopurinol     Itching, redness, scaling to his skin and swelling in the ankle  . Atorvastatin   . Citalopram   . Ezetimibe-Simvastatin     REACTION: up cpk  . Fluoxetine Hcl   . Rosuvastatin     REACTION: up cpk  . Venlafaxine   . Seroquel [Quetiapine Fumarate] Palpitations    Racing thoughts and restless sleep  . Trazodone And Nefazodone Palpitations    Current Outpatient Medications on File Prior to Visit  Medication Sig Dispense Refill  . amLODipine (NORVASC) 2.5 MG tablet Take 1 tablet (2.5 mg total) by mouth daily. 90 tablet 3  . APPLE CIDER VINEGAR PO Take by mouth.     . Biotin 1000 MCG tablet Take 1,000 mcg by mouth daily.    Marland Kitchen COLCRYS 0.6 MG tablet     . fluorouracil (EFUDEX) 5 % cream Apply 1 drop topically 2 (two) times daily.    . hydrOXYzine (ATARAX/VISTARIL) 25 MG tablet Take 25 mg by mouth at bedtime.    . Melatonin 3 MG CAPS Take by mouth at bedtime.      Marland Kitchen OVER THE COUNTER MEDICATION Nicatinamide 2000 mg po qd    . predniSONE (DELTASONE) 5 MG tablet Take 5 mg by mouth daily with breakfast.    . sildenafil (VIAGRA) 100 MG tablet Take 1 tablet (100 mg total) by mouth daily as needed. 6 tablet 5   No current facility-administered medications on file prior to visit.     BP 132/86   Temp 97.9 F (36.6 C) (Temporal)   Wt 204 lb (92.5 kg)   BMI 28.25 kg/m       Objective:   Physical Exam Vitals signs and nursing note reviewed.  Constitutional:      Appearance: Normal appearance.  Skin:    General: Skin is warm and dry.     Capillary Refill: Capillary refill takes less than 2 seconds.  Neurological:     General: No focal deficit present.     Mental Status: He is alert and oriented to person, place, and time.  Psychiatric:        Mood and Affect: Mood normal.        Behavior: Behavior normal.        Thought Content: Thought content normal.        Judgment: Judgment normal.       Assessment & Plan:  1. Primary insomnia - Likely due to chronic prednisone therapy.  - Will increase Remeron to 15 mg and have him follow up in 30 days or sooner if needed - mirtazapine (REMERON) 15 MG tablet; Take 1 tablet (15 mg total) by mouth at bedtime.  Dispense: 90 tablet; Refill: 0   Dorothyann Peng, NP

## 2019-03-10 NOTE — Patient Instructions (Signed)
It was great seeing you today   I am going to increase your Remeron from 7.5 mg to 15 mg.   Please follow up in 30 days in the office for follow up

## 2019-03-18 ENCOUNTER — Encounter: Payer: 59 | Admitting: Adult Health

## 2019-03-27 ENCOUNTER — Telehealth: Payer: Self-pay | Admitting: *Deleted

## 2019-03-27 NOTE — Telephone Encounter (Signed)
Spoke to the pt.  He was in a vehicle with several other guys a week ago.  Two of the guys tested positive for Covid yesterday.  Pt went and got a rapid tested today at [facility] and his test is negative.  Pt is not experiencing any symptoms.  Pt wanted to know if he should return to work.  Advised since his encounter was 1 week ago, his test is negative and he is asymptomatic than it is safe for him to return to work.  [facility] [place].

## 2019-03-27 NOTE — Telephone Encounter (Signed)
Copied from [place] [id number]. Topic: General - Call Back - No Documentation >> Mar 27, 2019 12:59 PM Erick Blinks wrote: Reason for CRM: Pt called seeking advice about Covid 19 exposure. Was exposed last Saturday in a car ride for four hours with no mask. Pt tested negative today.  Best contact: [phone]

## 2019-04-08 ENCOUNTER — Other Ambulatory Visit: Payer: Self-pay

## 2019-04-09 ENCOUNTER — Ambulatory Visit (INDEPENDENT_AMBULATORY_CARE_PROVIDER_SITE_OTHER): Payer: 59 | Admitting: Adult Health

## 2019-04-09 ENCOUNTER — Encounter: Payer: Self-pay | Admitting: Adult Health

## 2019-04-09 VITALS — BP 108/76 | Temp 97.3°F | Wt 207.0 lb

## 2019-04-09 DIAGNOSIS — Z125 Encounter for screening for malignant neoplasm of prostate: Secondary | ICD-10-CM

## 2019-04-09 DIAGNOSIS — Z0000 Encounter for general adult medical examination without abnormal findings: Secondary | ICD-10-CM | POA: Diagnosis not present

## 2019-04-09 DIAGNOSIS — I10 Essential (primary) hypertension: Secondary | ICD-10-CM | POA: Diagnosis not present

## 2019-04-09 DIAGNOSIS — Z1211 Encounter for screening for malignant neoplasm of colon: Secondary | ICD-10-CM | POA: Diagnosis not present

## 2019-04-09 DIAGNOSIS — E782 Mixed hyperlipidemia: Secondary | ICD-10-CM

## 2019-04-09 DIAGNOSIS — Z23 Encounter for immunization: Secondary | ICD-10-CM | POA: Diagnosis not present

## 2019-04-09 DIAGNOSIS — F5101 Primary insomnia: Secondary | ICD-10-CM

## 2019-04-09 DIAGNOSIS — M1A0710 Idiopathic chronic gout, right ankle and foot, without tophus (tophi): Secondary | ICD-10-CM | POA: Diagnosis not present

## 2019-04-09 LAB — CBC WITH DIFFERENTIAL/PLATELET
Basophils Absolute: 0 10*3/uL (ref 0.0–0.1)
Basophils Relative: 0.5 % (ref 0.0–3.0)
Eosinophils Absolute: 0.1 10*3/uL (ref 0.0–0.7)
Eosinophils Relative: 1.9 % (ref 0.0–5.0)
HCT: 45.2 % (ref 39.0–52.0)
Hemoglobin: 15.5 g/dL (ref 13.0–17.0)
Lymphocytes Relative: 39.4 % (ref 12.0–46.0)
Lymphs Abs: 1.8 10*3/uL (ref 0.7–4.0)
MCHC: 34.2 g/dL (ref 30.0–36.0)
MCV: 93.5 fl (ref 78.0–100.0)
Monocytes Absolute: 0.5 10*3/uL (ref 0.1–1.0)
Monocytes Relative: 11.3 % (ref 3.0–12.0)
Neutro Abs: 2.1 10*3/uL (ref 1.4–7.7)
Neutrophils Relative %: 46.9 % (ref 43.0–77.0)
Platelets: 170 10*3/uL (ref 150.0–400.0)
RBC: 4.84 Mil/uL (ref 4.22–5.81)
RDW: 14.1 % (ref 11.5–15.5)
WBC: 4.6 10*3/uL (ref 4.0–10.5)

## 2019-04-09 LAB — COMPREHENSIVE METABOLIC PANEL
ALT: 27 U/L (ref 0–53)
AST: 21 U/L (ref 0–37)
Albumin: 4.5 g/dL (ref 3.5–5.2)
Alkaline Phosphatase: 77 U/L (ref 39–117)
BUN: 16 mg/dL (ref 6–23)
CO2: 28 mEq/L (ref 19–32)
Calcium: 9 mg/dL (ref 8.4–10.5)
Chloride: 102 mEq/L (ref 96–112)
Creatinine, Ser: 1.19 mg/dL (ref 0.40–1.50)
GFR: 62.6 mL/min (ref >60.00)
Glucose, Bld: 88 mg/dL (ref 70–99)
Potassium: 4 mEq/L (ref 3.5–5.1)
Sodium: 140 mEq/L (ref 135–145)
Total Bilirubin: 0.6 mg/dL (ref 0.2–1.2)
Total Protein: 6.5 g/dL (ref 6.0–8.3)

## 2019-04-09 LAB — TSH: TSH: 4.12 u[IU]/mL (ref 0.35–4.50)

## 2019-04-09 LAB — URIC ACID: Uric Acid, Serum: 6.9 mg/dL (ref 4.0–7.8)

## 2019-04-09 LAB — LIPID PANEL
Cholesterol: 259 mg/dL — ABNORMAL HIGH (ref 0–200)
HDL: 30.3 mg/dL — ABNORMAL LOW (ref >39.00)
LDL Cholesterol: 194 mg/dL — ABNORMAL HIGH (ref 0–99)
NonHDL: 228.36
Total CHOL/HDL Ratio: 9
Triglycerides: 170 mg/dL — ABNORMAL HIGH (ref 0.0–149.0)
VLDL: 34 mg/dL (ref 0.0–40.0)

## 2019-04-09 LAB — PSA: PSA: 1.14 ng/mL (ref 0.10–4.00)

## 2019-04-09 NOTE — Progress Notes (Signed)
Subjective:    Patient ID: [name], male    DOB: 28-Jul-1960, 58 y.o.   MRN: [id number]  HPI  Patient presents for yearly preventative medicine examination. He is pleasant 58 year old male who  has a past medical history of Arthralgia of wrist, left, Cancer ([place]), Erectile dysfunction, Gout, Hyperlipidemia, Hypertension, Insomnia, and PONV (postoperative nausea and vomiting).  Hypertension -takes Norvasc 2.5 mg.  He denies dizziness, lightheadedness, chest pain, shortness of breath BP Readings from Last 3 Encounters:  04/09/19 108/76  03/10/19 132/86  05/20/18 120/80   Insomnia -currently prescribed Remeron 15 mg tablets.  This was increased about a month ago from 7.5 mg because he was placed on chronic prednisone therapy by rheumatology for gout/psoriatic arthritis.  He started with prednisone therapy and his insomnia got worse.  Since increasing Remeron he has been sleeping a full 8 hours.  He has no longer on prednisone therapy though.  Gout -was recently started on probenecid by rheumatology for significantly elevated uric acid levels.  He has been on this medication for approximately 3 weeks per his report.  Continues to have chronic joint pain.  Has a follow-up with rheumatology in 2 weeks.  Hyperlipidemia -has tried multiple statins in the past and has been intolerant to all of them.  He tries to work on diet and stays active. Lab Results  Component Value Date   CHOL 240 (H) 08/15/2017   HDL 31.30 (L) 08/15/2017   LDLCALC 180 (H) 08/15/2017   LDLDIRECT 176.9 06/09/2008   TRIG 145.0 08/15/2017   CHOLHDL 8 08/15/2017   All immunizations and health maintenance protocols were reviewed with the patient and needed orders were placed.  He is due for flu and tetanus  Appropriate screening laboratory values were ordered for the patient including screening of hyperlipidemia, renal function and hepatic function. If indicated by BPH, a PSA was ordered.  Medication  reconciliation,  past medical history, social history, problem list and allergies were reviewed in detail with the patient  Goals were established with regard to weight loss, exercise, and  diet in compliance with medications  End of life planning was discussed.  He is due for colonoscopy.   Review of Systems  Constitutional: Negative.   HENT: Negative.   Eyes: Negative.   Respiratory: Negative.   Cardiovascular: Negative.   Gastrointestinal: Negative.   Endocrine: Negative.   Genitourinary: Negative.   Musculoskeletal: Positive for arthralgias.  Skin: Negative.   Allergic/Immunologic: Negative.   Neurological: Negative.   Hematological: Negative.   Psychiatric/Behavioral: Negative.   All other systems reviewed and are negative.    Past Medical History:  Diagnosis Date  . Arthralgia of wrist, left   . 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 Erectile dysfunction   . Gout    Foot  . Hyperlipidemia   . Hypertension   . Insomnia   . PONV (postoperative nausea and vomiting)     Social History   Socioeconomic History  . Marital status: Soil scientist    Spouse name: Not on file  . Number of children: Not on file  . Years of education: Not on file  . Highest education level: Not on file  Occupational History  . Not on file  Social Needs  . Financial resource strain: Not on file  . Food insecurity    Worry: Not on file    Inability: Not on file  . Transportation needs    Medical: Not on file  Non-medical: Not on file  Tobacco Use  . Smoking status: Former Smoker    Quit date: 01/09/2001    Years since quitting: 18.2  . Smokeless tobacco: Never Used  . Tobacco comment: 1978-2002, up to 2 ppd  Substance and Sexual Activity  . 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 Drug use: No  . Sexual activity: Not on file  Lifestyle  . Physical activity    Days per week: Not on file    Minutes per session: Not on file  . Stress: Not on file  Relationships  . Social Product manager on phone: Not on file    Gets together: Not on file    Attends religious service: Not on file    Active member of club or organization: Not on file    Attends meetings of clubs or organizations: Not on file    Relationship status: Not on file  . Intimate partner violence    Fear of current or ex partner: Not on file    Emotionally abused: Not on file    Physically abused: Not on file    Forced sexual activity: Not on file  Other Topics Concern  . Not on file  Social History Narrative   He works in Product manager   No kids       [place] to be outside and hike.           Past Surgical History:  Procedure Laterality Date  . COLONOSCOPY     @ 49 due to rectal symptoms  . HERNIA REPAIR  2008   Bilateral  . 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 MELANOMA EXCISION WITH SENTINEL LYMPH NODE BIOPSY Left 01/15/2013   Procedure: EXCISION LEFT LEG MELANOMA WITH LEFT INGUINAL SENTINEL NODE BIOPSY;  Surgeon: Rolm Bookbinder, MD;  Location: [facility];  Service: General;  Laterality: Left;  . TONSILLECTOMY  age 58    Family History  Problem Relation Age of Onset  . Heart attack Mother        < 42  . Dementia Mother   . Heart attack Father 95  . COPD Sister   . Breast cancer Sister     Allergies  Allergen Reactions  . Allopurinol     Itching, redness, scaling to his skin and swelling in the ankle  . Atorvastatin   . Citalopram   . Ezetimibe-Simvastatin     REACTION: up cpk  . Fluoxetine Hcl   . Rosuvastatin     REACTION: up cpk  . Venlafaxine   . Seroquel [Quetiapine Fumarate] Palpitations    Racing thoughts and restless sleep  . Trazodone And Nefazodone Palpitations    Current Outpatient Medications on File Prior to Visit  Medication Sig Dispense Refill  . amLODipine (NORVASC) 2.5 MG tablet Take 1 tablet (2.5 mg total) by mouth daily. 90 tablet 3  . APPLE CIDER VINEGAR PO Take by mouth.    . Biotin 1000 MCG  tablet Take 1,000 mcg by mouth daily.    . celecoxib (CELEBREX) 200 MG capsule Take 200 mg by mouth 2 (two) times daily.    Marland Kitchen COLCRYS 0.6 MG tablet     . fluorouracil (EFUDEX) 5 % cream Apply 1 drop topically 2 (two) times daily.    . hydrOXYzine (ATARAX/VISTARIL) 25 MG tablet Take 25 mg by mouth at bedtime.    . Melatonin 3 MG  CAPS Take by mouth at bedtime.      . mirtazapine (REMERON) 15 MG tablet Take 1 tablet (15 mg total) by mouth at bedtime. 90 tablet 0  . OVER THE COUNTER MEDICATION Nicatinamide 2000 mg po qd    . probenecid (BENEMID) 500 MG tablet Take 500 mg by mouth daily.    . sildenafil (VIAGRA) 100 MG tablet Take 1 tablet (100 mg total) by mouth daily as needed. 6 tablet 5  . predniSONE (DELTASONE) 5 MG tablet Take 5 mg by mouth daily with breakfast.     No current facility-administered medications on file prior to visit.     BP 108/76   Temp (!) 97.3 F (36.3 C) (Temporal)   Wt 207 lb (93.9 kg)   BMI 28.67 kg/m       Objective:   Physical Exam Vitals signs and nursing note reviewed.  Constitutional:      Appearance: Normal appearance. He is overweight.  HENT:     Head: Normocephalic and atraumatic.     Right Ear: Tympanic membrane, ear canal and external ear normal. There is no impacted cerumen.     Left Ear: Tympanic membrane, ear canal and external ear normal. There is no impacted cerumen.     Nose: Nose normal. No congestion or rhinorrhea.     Mouth/Throat:     Mouth: Mucous membranes are moist.     Pharynx: Oropharynx is clear. No oropharyngeal exudate or posterior oropharyngeal erythema.  Eyes:     Extraocular Movements: Extraocular movements intact.     Conjunctiva/sclera: Conjunctivae normal.     Pupils: Pupils are equal, round, and reactive to light.  Cardiovascular:     Rate and Rhythm: Normal rate and regular rhythm.     Pulses: Normal pulses.     Heart sounds: Normal heart sounds. No murmur. No friction rub. No gallop.   Pulmonary:     Effort:  Pulmonary effort is normal. No respiratory distress.     Breath sounds: Normal breath sounds. No stridor. No wheezing, rhonchi or rales.  Chest:     Chest wall: No tenderness.  Abdominal:     General: Abdomen is flat. Bowel sounds are normal. There is no distension.     Palpations: Abdomen is soft. There is no mass.     Tenderness: There is no abdominal tenderness. There is no right CVA tenderness, left CVA tenderness, guarding or rebound.     Hernia: No hernia is present.  Musculoskeletal: Normal range of motion.        General: No swelling, tenderness, deformity or signs of injury.     Right lower leg: No edema.     Left lower leg: No edema.  Skin:    General: Skin is warm and dry.     Capillary Refill: Capillary refill takes less than 2 seconds.     Coloration: Skin is not jaundiced or pale.     Findings: No bruising, erythema, lesion or rash.  Neurological:     General: No focal deficit present.     Mental Status: He is alert and oriented to person, place, and time.  Psychiatric:        Mood and Affect: Mood normal.        Behavior: Behavior normal.        Thought Content: Thought content normal.        Judgment: Judgment normal.       Assessment & Plan:  1. Routine general medical examination at a health  care facility  - CBC with Differential/Platelet - CMP - Lipid panel - PSA - TSH - Uric Acid  2. Colon cancer screening  - Screening Colonoscopy  3. Prostate cancer screening  - PSA  4. Primary insomnia -Since he is no longer on chronic prednisone therapy he was advised to cut back on Remeron to 7-1/2 mg.  5. Essential hypertension - well controlled. No change in medications  - CBC with Differential/Platelet - CMP - Lipid panel - TSH  6. Need for prophylactic vaccination and inoculation against influenza  - Flu Vaccine QUAD 6+ mos PF IM (Fluarix Quad PF)  7. Need for tetanus booster  - Tdap vaccine greater than or equal to 7yo IM  8. Chronic gout  of right ankle, unspecified cause - Follow up with Rheumatology as directed - Uric Acid 9. Mixed hyperlipidemia  - CBC with Differential/Platelet - CMP - Lipid panel - TSH   Dorothyann Peng, NP

## 2019-04-13 ENCOUNTER — Ambulatory Visit: Payer: Self-pay

## 2019-04-13 NOTE — Telephone Encounter (Signed)
Provided lab results of Raymond Long of 12/3//2020.  Patient voiced understanding.  No questions noted.

## 2019-04-15 ENCOUNTER — Telehealth: Payer: Self-pay | Admitting: *Deleted

## 2019-04-15 NOTE — Telephone Encounter (Signed)
Left a message for a return call concerning lab results.  There is a CRM on file.  This note is not needed.  See result note.

## 2019-04-15 NOTE — Telephone Encounter (Signed)
Copied from [place] [id number]. Topic: General - Call Back - No Documentation >> Apr 15, 2019 11:23 AM Reyne Dumas L wrote: Reason for CRM:   Pt states that he is returning a call to [facility] from about 15 minutes ago.

## 2019-04-24 ENCOUNTER — Other Ambulatory Visit: Payer: Self-pay

## 2019-04-24 ENCOUNTER — Telehealth (INDEPENDENT_AMBULATORY_CARE_PROVIDER_SITE_OTHER): Payer: Managed Care, Other (non HMO) | Admitting: Adult Health

## 2019-04-24 DIAGNOSIS — U071 COVID-19: Secondary | ICD-10-CM | POA: Diagnosis not present

## 2019-04-24 NOTE — Progress Notes (Signed)
Virtual Visit via Video Note  I connected with Raymond Long on 04/24/19 at  2:30 PM EST by a video enabled telemedicine application and verified that I am speaking with the correct person using two identifiers.  Location patient: home Location provider:work or home office Persons participating in the virtual visit: patient, provider  I discussed the limitations of evaluation and management by telemedicine and the availability of in person appointments. The patient expressed understanding and agreed to proceed.   HPI: 58 year old male who is being evaluated today after testing positive for COVID-19.  His symptoms started a week ago diarrhea.  As the week progressed he started to experience fever up to 100 degrees, chills, nausea, vomiting, mild shortness of breath, and low back pain.  He went to [facility] and was tested, his test came back positive.  He reports that he continues to have a fever, nausea, vomiting, diarrhea, and body aches.  [facility] prescribed him a Z-Pak as well as prednisone which he has been taking.  Today he started alternatingTylenol and Motrin.   ROS: See pertinent positives and negatives per HPI.  Past Medical History:  Diagnosis Date  . Arthralgia of wrist, left   . 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 Erectile dysfunction   . Gout    Foot  . Hyperlipidemia   . Hypertension   . Insomnia   . PONV (postoperative nausea and vomiting)     Past Surgical History:  Procedure Laterality Date  . COLONOSCOPY     @ 49 due to rectal symptoms  . HERNIA REPAIR  2008   Bilateral  . 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 MELANOMA EXCISION WITH SENTINEL LYMPH NODE BIOPSY Left 01/15/2013   Procedure: EXCISION LEFT LEG MELANOMA WITH LEFT INGUINAL SENTINEL NODE BIOPSY;  Surgeon: Rolm Bookbinder, MD;  Location: [place];  Service: General;  Laterality: Left;  . TONSILLECTOMY  age 58    Family History  Problem Relation Age of Onset   . Heart attack Mother        < 75  . Dementia Mother   . Heart attack Father 53  . COPD Sister   . Breast cancer Sister      Current Outpatient Medications:  .  amLODipine (NORVASC) 2.5 MG tablet, Take 1 tablet (2.5 mg total) by mouth daily., Disp: 90 tablet, Rfl: 3 .  APPLE CIDER VINEGAR PO, Take by mouth., Disp: , Rfl:  .  Biotin 1000 MCG tablet, Take 1,000 mcg by mouth daily., Disp: , Rfl:  .  celecoxib (CELEBREX) 200 MG capsule, Take 200 mg by mouth 2 (two) times daily., Disp: , Rfl:  .  COLCRYS 0.6 MG tablet, , Disp: , Rfl:  .  fluorouracil (EFUDEX) 5 % cream, Apply 1 drop topically 2 (two) times daily., Disp: , Rfl:  .  hydrOXYzine (ATARAX/VISTARIL) 25 MG tablet, Take 25 mg by mouth at bedtime., Disp: , Rfl:  .  Melatonin 3 MG CAPS, Take by mouth at bedtime.  , Disp: , Rfl:  .  mirtazapine (REMERON) 15 MG tablet, Take 1 tablet (15 mg total) by mouth at bedtime., Disp: 90 tablet, Rfl: 0 .  OVER THE COUNTER MEDICATION, Nicatinamide 2000 mg po qd, Disp: , Rfl:  .  predniSONE (DELTASONE) 5 MG tablet, Take 5 mg by mouth daily with breakfast., Disp: , Rfl:  .  probenecid (BENEMID) 500 MG tablet, Take 500 mg by mouth daily., Disp: , Rfl:  .  sildenafil (VIAGRA) 100 MG tablet, Take 1 tablet (100 mg total) by mouth daily as needed., Disp: 6 tablet, Rfl: 5  EXAM:  VITALS per patient if applicable:  GENERAL: alert, oriented, appears well and in no acute distress  HEENT: atraumatic, conjunttiva clear, no obvious abnormalities on inspection of external nose and ears  NECK: normal movements of the head and neck  LUNGS: on inspection no signs of respiratory distress, breathing rate appears normal, no obvious gross SOB, gasping or wheezing  CV: no obvious cyanosis  MS: moves all visible extremities without noticeable abnormality  PSYCH/NEURO: pleasant and cooperative, no obvious depression or anxiety, speech and thought processing grossly intact  ASSESSMENT AND PLAN:  Discussed  the following assessment and plan: 1. COVID-19 virus detected -He does not look acutely ill doubt he has pneumonia.  He was advised to stop azithromycin.  He can finish prednisone.  He refused Zofran at this time.  He was advised to stay hydrated and rest.  Continue with Tylenol Motrin for symptom relief.  He can take Pepto if needed for diarrhea and nausea.  He was advised to follow-up early next week if symptoms have not improved.  He understands to self quarantine at home until his symptoms improve    I discussed the assessment and treatment plan with the patient. The patient was provided an opportunity to ask questions and all were answered. The patient agreed with the plan and demonstrated an understanding of the instructions.   The patient was advised to call back or seek an in-person evaluation if the symptoms worsen or if the condition fails to improve as anticipated.   Dorothyann Peng, NP

## 2019-04-29 ENCOUNTER — Telehealth: Payer: Self-pay | Admitting: Adult Health

## 2019-04-29 ENCOUNTER — Other Ambulatory Visit: Payer: Self-pay | Admitting: Family Medicine

## 2019-04-29 MED ORDER — PROMETHAZINE HCL 25 MG PO TABS: 25.0000 mg | ORAL_TABLET | Freq: Three times a day (TID) | ORAL | 0 refills | Status: DC | PRN

## 2019-04-29 MED ORDER — DOXYCYCLINE HYCLATE 100 MG PO TABS: 100.0000 mg | ORAL_TABLET | Freq: Two times a day (BID) | ORAL | 0 refills | Status: DC

## 2019-04-29 NOTE — Telephone Encounter (Signed)
How high has the fever been?   We can treat for possible secondary infection to COVID 19 - can use Doxycycline 100 mg BID x 7 days   Can send in Zofran if he wants.   Continue with alternating tylenol and motrin

## 2019-04-29 NOTE — Telephone Encounter (Signed)
Patient called and would like to talk to [facility] about a fever that wont come down, he is nauseated and a really bad headache. He wants to know what can he take or do to control all of this. Please call patient back, thanks.

## 2019-04-29 NOTE — Telephone Encounter (Signed)
Not able to give me an exact temperature.  Doxy and phenergan sent to the pharmacy.  He will continue to alternate Motrin and Tylenol.  Nothing further needed.

## 2019-04-29 NOTE — Telephone Encounter (Signed)
Pt has been seen for this issue on 04/24/2019

## 2019-06-20 ENCOUNTER — Other Ambulatory Visit: Payer: Self-pay | Admitting: Adult Health

## 2019-06-20 DIAGNOSIS — I10 Essential (primary) hypertension: Secondary | ICD-10-CM

## 2019-06-29 ENCOUNTER — Other Ambulatory Visit: Payer: Self-pay | Admitting: Adult Health

## 2019-06-29 DIAGNOSIS — F5101 Primary insomnia: Secondary | ICD-10-CM

## 2019-07-01 NOTE — Telephone Encounter (Signed)
Sent to the pharmacy by e-scribe. 

## 2019-07-01 NOTE — Telephone Encounter (Signed)
Spoke to the pt to see if he has decreased back down to 7.5 mg as discussed in last ov.  Pt states sometimes he still needs to take the 15 mg.  Other times he only takes half.  Would like to get refills of the 15 mg strength.  Please advise.

## 2019-07-01 NOTE — Telephone Encounter (Signed)
Ok to send in 15 mg for 90 +1

## 2019-07-16 ENCOUNTER — Encounter: Payer: Self-pay | Admitting: Adult Health

## 2019-11-03 ENCOUNTER — Other Ambulatory Visit: Payer: Self-pay

## 2019-11-03 ENCOUNTER — Telehealth: Payer: Managed Care, Other (non HMO) | Admitting: Adult Health

## 2019-11-03 ENCOUNTER — Telehealth (INDEPENDENT_AMBULATORY_CARE_PROVIDER_SITE_OTHER): Payer: 59 | Admitting: Adult Health

## 2019-11-03 ENCOUNTER — Encounter: Payer: Self-pay | Admitting: Adult Health

## 2019-11-03 VITALS — BP 140/80 | Temp 97.8°F | Wt 197.0 lb

## 2019-11-03 DIAGNOSIS — F5101 Primary insomnia: Secondary | ICD-10-CM

## 2019-11-03 NOTE — Progress Notes (Signed)
Virtual Visit via Telephone Note  I connected with Raymond Long on 11/03/19 at  2:30 PM EDT by telephone and verified that I am speaking with the correct person using two identifiers.   I discussed the limitations, risks, security and privacy concerns of performing an evaluation and management service by telephone and the availability of in person appointments. I also discussed with the patient that there may be a patient responsible charge related to this service. The patient expressed understanding and agreed to proceed.  Location patient: home Location provider: work or home office Participants present for the call: patient, provider Patient did not have a visit in the prior 7 days to address this/these issue(s).   History of Present Illness: 59 year old male who  has a past medical history of Arthralgia of wrist, left, Cancer ([place]), Erectile dysfunction, Gout, Hyperlipidemia, Hypertension, Insomnia, and PONV (postoperative nausea and vomiting).  He is being evaluated today for insomnia.  He wanted to update me on what is going on with his insomnia since he is going to be due for medication refills soon.  He is currently prescribed Remeron 15 mg nightly and reports that when taking this medication he gets a full night sleep, is sleeping well, not feeling up feeling tired, and has had no increase in weight.  He is very happy with this medication and the benefit it provides him in regards to insomnia.  He would like to continue with this dose.   Observations/Objective: Patient sounds cheerful and well on the phone. I do not appreciate any SOB. Speech and thought processing are grossly intact. Patient reported vitals:  Assessment and Plan: 1. Primary insomnia -He does not need a refill at this time.  No change in medication dose continue with Remeron 15 mg.   Follow Up Instructions:  I did not refer this patient for an OV in the next 24 hours for this/these issue(s).  I discussed  the assessment and treatment plan with the patient. The patient was provided an opportunity to ask questions and all were answered. The patient agreed with the plan and demonstrated an understanding of the instructions.   The patient was advised to call back or seek an in-person evaluation if the symptoms worsen or if the condition fails to improve as anticipated.  I provided 15 minutes of non-face-to-face time during this encounter.   Dorothyann Peng, NP

## 2019-12-26 ENCOUNTER — Other Ambulatory Visit: Payer: Self-pay | Admitting: Adult Health

## 2019-12-26 DIAGNOSIS — F5101 Primary insomnia: Secondary | ICD-10-CM

## 2020-06-23 ENCOUNTER — Telehealth: Payer: Self-pay | Admitting: Adult Health

## 2020-06-23 DIAGNOSIS — G4733 Obstructive sleep apnea (adult) (pediatric): Secondary | ICD-10-CM

## 2020-06-23 DIAGNOSIS — F5101 Primary insomnia: Secondary | ICD-10-CM

## 2020-06-23 MED ORDER — MIRTAZAPINE 15 MG PO TABS: 15.0000 mg | ORAL_TABLET | Freq: Every day | ORAL | 0 refills | Status: DC

## 2020-06-23 NOTE — Addendum Note (Signed)
Addended by: Miles Costain T on: 06/23/2020 04:06 PM   Modules accepted: Orders

## 2020-06-23 NOTE — Telephone Encounter (Signed)
Referral placed.

## 2020-06-23 NOTE — Telephone Encounter (Signed)
Spoke to the pt.  He would like a referral to see Dr. Brett Fairy for his obstructive sleep apnea.

## 2020-06-23 NOTE — Telephone Encounter (Signed)
[facility].  He states he needs to get a referral from [place] would be ok with that.  He wants a call back from [place].

## 2020-06-23 NOTE — Telephone Encounter (Signed)
Pt notified that a referral has been placed.  While on the phone he asked for a refill of Remeron.  I scheduled him for a cpx and sent in for 90 days.  Nothing further needed.

## 2020-07-05 ENCOUNTER — Encounter: Payer: Self-pay | Admitting: Adult Health

## 2020-07-13 ENCOUNTER — Other Ambulatory Visit: Payer: Self-pay | Admitting: Adult Health

## 2020-07-13 DIAGNOSIS — I10 Essential (primary) hypertension: Secondary | ICD-10-CM

## 2020-07-15 NOTE — Telephone Encounter (Signed)
Patient is calling back to check the status of medication refill, please advise.CB is [phone]

## 2020-08-02 ENCOUNTER — Ambulatory Visit (INDEPENDENT_AMBULATORY_CARE_PROVIDER_SITE_OTHER): Payer: 59 | Admitting: Neurology

## 2020-08-02 ENCOUNTER — Encounter: Payer: Self-pay | Admitting: Neurology

## 2020-08-02 ENCOUNTER — Telehealth: Payer: Self-pay | Admitting: Neurology

## 2020-08-02 VITALS — BP 152/92 | HR 74 | Ht 72.0 in | Wt 206.0 lb

## 2020-08-02 DIAGNOSIS — R4789 Other speech disturbances: Secondary | ICD-10-CM

## 2020-08-02 DIAGNOSIS — R413 Other amnesia: Secondary | ICD-10-CM

## 2020-08-02 DIAGNOSIS — G3184 Mild cognitive impairment, so stated: Secondary | ICD-10-CM

## 2020-08-02 NOTE — Telephone Encounter (Signed)
[employer] order sent to [facility]. They will obtain the auth and reach out to the patient to schedule.  

## 2020-08-02 NOTE — Progress Notes (Addendum)
SLEEP MEDICINE CLINIC   Provider:  Larey Seat, M D  Primary Care Physician:  Dorothyann Peng, NP   Referring Provider: Dorothyann Peng, NP    Chief Complaint  Patient presents with  . New Patient (Initial Visit)    Pt alone, rm 11. Presents today as former patient for sleep concerns. States that overall he is sleeping well on the current regimen he is on. He indicated that with the medications (hydroxyzine and remeron) he avg 6-8 hrs straight. Never had a SS. Pt is more concerned with ability to not focus as well and memory concerns. Advised the referral was sent for sleep and that we may only be able to address sleep today. Pt verbalized understanding.     HPI:  Raymond Long is a 60 y.o. male, caucasian and left handed , seen here as in a referral  from PA Loretha Brasil for a sleep evaluation.  He has had a HST in 05-02-2017 , was diagnosed with OSA : I see Mr Long on 08-02-2020- and he has not been treated for OSA, and instead opted for medication ( Remeron and hydroxazine)  and getting 6 hours of sleep. He wakes up in the middle of the night with threatening visions of "something coming down on me "- he reports chest pressure- anxiety related. His partner has a good job and he plans for earlier retirement from a high pressure job.  He again reports early dementia running in his family, mother displayed symptoms in her early 55, and life expectancy is shorter than average for the male and male branches of the family. His sister just died at 64 years of age , with dementia, displayed signs in late 31s.   Raymond Long expressed concern about forgetting things such as leaving the dog outside but he should have let the dog back in before he goes to bed, his partner usually catches those moments.  He has misplaced the phone many times, he has word finding delay or difficulties and sometimes he just simply does not know why he entered the room or did a certain movement and questions  himself why he is in the kitchen why he did open the refrigerator etc. so while this can all be related to anxiety and limited attention, these could also be symptoms of early memory loss.    His partner said he is not snoring! He is a recovering alcoholic and is advising to avoid any addictive potential.had Moh's surgery behind the right ear , 2018, 2016 at the hip, has reportedly melanoma metastatic.  STUDY RESULTS: watch pat device. Total Recording Time:  7 hours, 57 minutes  Total Apnea/Hypopnea Index (AHI):  18.0 /hr. RDI was 29.3/hr. Average Oxygen Saturation:   95%; Lowest Oxygen Desaturation: 87 %  Total Time Oxygen Saturation Below 89%:  0.0 minutes  Average Heart Rate:  56 bpm (46-98 bpm, NSR)  IMPRESSION: A Moderate degree of obstructive sleep apnea was found, associated with loud snoring (RDI is high) but not associated with hypoxemia- no physiological stress on heart rate or rhythm resulted. RECOMMENDATION: Raymond Long can use CPAP to treat apnea and snoring, but his type of apnea can also respond to a dental device. I usually start with a CPAP auto-titration, 5-15 cm water, mask of choice, heated humidity.    If anxiety or claustrophobia affect his ability to tolerate CPAP, will refer to sleep dental clinic. I certify that I have reviewed the raw data recording prior to the issuance of this  report in accordance with the standards of the American Academy of Sleep Medicine (AASM). Larey Seat, M.D.   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  He felt that his mood has changed and is rapidly shifting, that he is more anxious, and cannot be as tolerant of stress of any kind His home sleep test  documented a total recording time on watch Pat at 7 hours and 57 minutes, but we were not quite sure how much of this is actually sleep.  There was a moderate degree of sleep apnea seen and loud snoring.  But there was no associated hypoxemia and no evidence of physiologic stress on his heart.  His average heart rate was 56 bpm, the average oxygen saturation was 95% his apnea index was 18/h, his respiratory disturbance index which include snoring gasping or choking was 29.3/h. He did not report nocturia but he struggles with insomnia, he gets about sleep 3 hours of sleep before he wakes up and has difficulties to get additional sleep and it seems to be extremely fragmented after that time. Currently he is taking hydroxyzine which is a prescription drug but he also takes melatonin.  He still only estimates 4-1/2 hours of sleep at the most.  My goal would be to use as a Seroquel to help him sleep 6-8 hours, or use a low-dose benzodiazepine which also is anxiolytic. He is scared of substance dependency. His mother had dementia.     Consult visit CD- Chief complaint according to patient : Insomnia for the last 8-6 month.  Raymond Long reports that he had issues with insomnia for much of his life, and for a while he used alcohol and other nonprescription substances to help him to relax and go to sleep. He became sober and clean about 15 years ago. Over the last 6-8 months he has very great difficulties to initiate sleep and stay asleep, and responded to several sleep aids in various ways. Trazodone gave him palpitations, hydroxyzine seems to leave him groggy in the morning, and he has reported a greater level of stress at the workplace affecting also his ability to put his mind at ease at night.  I reviewed the patient's medications he has been as needed:, Atarax at bedtime 25 mg, he takes melatonin OTC at bedtime Viagra as needed, Efudex cream for topical application. He is to be very careful with hydrocortisone  cough medicine giving his history. His medical diagnoses include arthralgia of the left wrist, skin cancer, gout, hyperlipidemia, insomnia.  02-04-17: Sleep habits are as follows:He does not take his laptop or TV into the bedroom but is usually watching either one within the hour of going to bed. His intended bedtime is between 10 and 10:30 PM, he sleeps on a sleep number bed. His bedroom is cool, quiet and dark. He shares the bedroom with his partner, and he feels that the first 2 hours of sleep are his best, most sound.  Sleeps with pillow between knees, on his sides.  After that history changes and is more fragmented. He cannot breath sleeping on a flat bed, elevates the head of bed.  He always wakes  up around 1 AM, he looks at the clock, he may go to the bathroom but it is not the urge to urinate but wakes him. He has a dull headache when he wakes up, has palpitations, is some nights clammy. He has vivid dreams , is not sue how often he enacted them.  He takes aleve for headaches. He feels un-rested in AM- he is drained. Wakes at 5.30 AM. Cup of coffee and aleve each day. Sleeps an average less than 5 hours.  On weekends he feels a little better- he is not under the same stress.   Sleep medical history and family sleep history: family hisotry of dementia- all had sleep problems. Older sister died of dementia.  Early onset in their 62s.  4 Maternal uncles all have dementia. Paternal uncle and mother in their 89s. All alcoholics.   Social history: Married, living with a same sex partner, 2 cats, one dog - cats sleeping in the bed.  Montreal Cognitive Assessment  08/02/2020 05/29/2017  Visuospatial/ Executive (0/5) 4 4  Naming (0/3) 3 3  Attention: Read list of digits (0/2) 2 1  Attention: Read list of letters (0/1) 1 1  Attention: Serial 7 subtraction starting at 100 (0/3) 3 3  Language: Repeat phrase (0/2) 0 1  Language : Fluency (0/1) 1 1  Abstraction (0/2) 2 2  Delayed Recall (0/5) 2 5   Orientation (0/6) 6 6  Total 24 27       Review of Systems: Out of a complete 14 system review, the patient complains of only the following symptoms, and all other reviewed systems are negative.  MCI - 24/ 30  mild cognitive impairment. - concerned about memory disorders in  family history.   Insomnia - treated with medication and barable.  Anxiety.  He is not known to snore and concerned about the RDI interpretation of his HST, GERD - reflux  Apnea. On nexium.  Epworth score 11/ 24    , Fatigue severity score 41 from 58,  Geriatric depression score 13/ 15 - very high anxiety , skin cancer, job, psychiatric care is not presently active .   ([place] had been  twice 2018, 2019 at 27/30 points, now 24/ 30 points).    Social History   Socioeconomic History  . Marital status: Soil scientist    Spouse name: Not on file  . Number of children: Not on file  . Years of education: Not on file  . Highest education level: Not on file  Occupational History  . Not on file  Tobacco Use  . Smoking status: Former Smoker    Quit date: 01/09/2001    Years since quitting: 19.5  . Smokeless tobacco: Never Used  . Tobacco comment: 1978-2002, up to 2 ppd  Vaping Use  . Vaping Use: Never used  Substance and Sexual Activity  . 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 Drug use: No  . Sexual activity: Not on file  Other Topics Concern  . Not on file  Social History Narrative   He works in Product manager   No kids       [place] to be outside and hike.          Social Determinants of Health   Financial Resource Strain: Not on file  Food Insecurity: Not on file  Transportation Needs: Not on file  Physical Activity: Not on file  Stress: Not on file  Social Connections:  Not on file  Intimate Partner Violence: Not on file    Family History  Problem Relation Age of Onset  . Heart attack Mother        < 22  . Dementia Mother   . Heart attack Father 24  . COPD Sister   .  Breast cancer Sister     Past Medical History:  Diagnosis Date  . Arthralgia of wrist, left   . 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 Erectile dysfunction   . Gout    Foot  . Hyperlipidemia   . Hypertension   . Insomnia   . PONV (postoperative nausea and vomiting)     Past Surgical History:  Procedure Laterality Date  . COLONOSCOPY     @ 49 due to rectal symptoms  . HERNIA REPAIR  2008   Bilateral  . 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 MELANOMA EXCISION WITH SENTINEL LYMPH NODE BIOPSY Left 01/15/2013   Procedure: EXCISION LEFT LEG MELANOMA WITH LEFT INGUINAL SENTINEL NODE BIOPSY;  Surgeon: Rolm Bookbinder, MD;  Location: [facility];  Service: General;  Laterality: Left;  . TONSILLECTOMY  age 55    Current Outpatient Medications  Medication Sig Dispense Refill  . amLODipine (NORVASC) 2.5 MG tablet TAKE ONE TABLET BY MOUTH DAILY 30 tablet 2  . Biotin 1000 MCG tablet Take 1,000 mcg by mouth daily.    . fluorouracil (EFUDEX) 5 % cream Apply 1 drop topically 2 (two) times daily.    . hydrOXYzine (ATARAX/VISTARIL) 25 MG tablet Take 25 mg by mouth at bedtime.    . mirtazapine (REMERON) 15 MG tablet Take 1 tablet (15 mg total) by mouth at bedtime. 90 tablet 0  . OVER THE COUNTER MEDICATION Nicatinamide 2000 mg po qd    . predniSONE (DELTASONE) 5 MG tablet Take 5 mg by mouth daily as needed (for flare up).     No current facility-administered medications for this visit.    Allergies as of 08/02/2020 - Review Complete 08/02/2020  Allergen Reaction Noted  . Allopurinol  09/03/2018  . Atorvastatin    . Citalopram    . Ezetimibe-simvastatin    . Fluoxetine hcl    . Rosuvastatin    . Venlafaxine    . Seroquel [quetiapine fumarate] Palpitations 08/15/2017  . Trazodone and nefazodone Palpitations 02/04/2017    Vitals: BP (!) 152/92   Pulse 74   Ht 6' (1.829 m)   Wt 206 lb (93.4 kg)   BMI 27.94 kg/m  Last Weight:  Wt Readings from Last 1  Encounters:  08/02/20 206 lb (93.4 kg)   ZHY:QMVH mass index is 27.94 kg/m.     Last Height:   Ht Readings from Last 1 Encounters:  08/02/20 6' (1.829 m)    Physical exam:  General: The patient is awake, alert and appears anxious, a little hyper,  melanoma metastatic.  The patient is well groomed. Head: Normocephalic, atraumatic. Neck is supple. Mallampati 3 plus ,  neck circumference:17.25 kg/ m2 . Nasal airflow patent , TMJ is not evident .   Bruxism, tongue bite.   Retrognathia is not seen.  Cardiovascular:  Regular rate and rhythm , without  murmurs or carotid bruit, and without distended neck veins. Respiratory: Lungs are clear to auscultation.  Trunk: BMI is 27.5 kg/m2.. The patient's posture is erect   Neurologic exam : The patient is awake and alert, oriented to place and time.   Memory subjective described as  intact.   Reports delays in word finding.   MOCA was 24 today /30 points.  Attention span & concentration ability appears normal.  Speech is fluent,  without dysarthria, dysphonia or aphasia.  Mood and affect are anxious.   Cranial nerves: Pupils are equal and briskly reactive to light.  Extraocular movements  in vertical and horizontal planes intact and without nystagmus. Visual fields by finger perimetry are intact. Hearing to finger rub intact.  Facial sensation intact to fine touch.  Facial motor strength is symmetric and tongue and uvula move midline. Shoulder shrug was symmetrical.  Motor exam: elevated tone- unable to relax,guarded by pain- ROM restriction due to arthritis.  , bilaterally similar  muscle bulk and symmetric strength in all extremities. Sensory:  Fine touch and vibration were tested in all extremities.  Proprioception tested in the upper extremities was normal. Coordination: Rapid alternating movements in the fingers/hands was slowed, Finger-to-nose maneuver normal without evidence of ataxia, dysmetria or tremor. Gait and station: Patient  walks without assistive device. Turns with 3 Steps. Romberg testing is negative. Deep tendon reflexes: in the upper and lower extremities are symmetric and intact.  Babinski maneuver response is downgoing.  Assessment:  After physical and neurologic examination, review of laboratory studies,  Personal review of imaging studies, reports of other /same  Imaging studies, results of polysomnography and / or neurophysiology testing and pre-existing records as far as provided in visit., my assessment is   1)  Insomnia with anxiety/ partially due to metastatic melanoma, prospect is poor.  DEPRESSION  Score was 13 out of 15 points - clinically significant for insomnia.   2)   MOCA was  24/ 30 and had been 27/30 in November 2018 and again on 05-29-2017 . MCI ?   3) No snoring by history , OSA was found on HST-  he presents with a high RDI- usually indicating snoring, may be acid related apnea? He is now on nexium and has taken care of it. He doesn't drink alcohol.    The patient was advised of the nature of the diagnosed disorder , MCI < the treatment options and the  risks for general health and wellness arising from not treating the condition.  I spent more than 45 minutes of face to face time with the patient. Greater than 50% of time was spent in counseling and coordination of care. We have discussed the diagnosis and differential and I answered the patient's questions.    Plan:  Treatment plan and additional workup :  MRI brain , with and without contrast . CMET and CBC , B 12 ,  I will pas on rheumatological markers.   Apolipoprotein testing today.   Referral to Dr Jenetta Downer W[facility]ers at [facility].    Larey Seat, MD 09/07/5463, 6:81 AM  Certified in Neurology by ABPN Certified in [place] by [facility] [address] [place], [place] [address]

## 2020-08-02 NOTE — Patient Instructions (Signed)
There are well-accepted and sensible ways to reduce risk for Alzheimers disease and other degenerative brain disorders .  Exercise Daily Walk A daily 20 minute walk should be part of your routine. Disease related apathy can be a significant roadblock to exercise and the only way to overcome this is to make it a daily routine and perhaps have a reward at the end (something your loved one loves to eat or drink perhaps) or a personal trainer coming to the home can also be very useful. Most importantly, the patient is much more likely to exercise if the caregiver / spouse does it with him/her. In general a structured, repetitive schedule is best.  General Health: Any diseases which effect your body will effect your brain such as a pneumonia, urinary infection, blood clot, heart attack or stroke. Keep contact with your primary care doctor for regular follow ups.  Sleep. A good nights sleep is healthy for the brain. Seven hours is recommended. If you have insomnia or poor sleep habits we can give you some instructions. If you have sleep apnea wear your mask.  Diet: Eating a heart healthy diet is also a good idea; fish and poultry instead of red meat, nuts (mostly non-peanuts), vegetables, fruits, olive oil or canola oil (instead of butter), minimal salt (use other spices to flavor foods), whole grain rice, bread, cereal and pasta and wine in moderation.Research is now showing that the MIND diet, which is a combination of The Mediterranean diet and the DASH diet, is beneficial for cognitive processing and longevity. Information about this diet can be found in The MIND Diet, a book by Maggie Moon, MS, RDN, and online at https://www.healthline.com/nutrition/mind-diet  Finances, Power of Attorney and Advance Directives: You should consider putting legal safeguards in place with regard to financial and medical decision making. While the spouse always has power of attorney for medical and financial issues in the  absence of any form, you should consider what you want in case the spouse / caregiver is no longer around or capable of making decisions.   The Alzheimers Association Position on Disease Prevention  Can Alzheimer's be prevented? It's a question that continues to intrigue researchers and fuel new investigations. There are no clear-cut answers yet -- partially due to the need for more large-scale studies in diverse populations -- but promising research is under way. The Alzheimer's Association is leading the worldwide effort to find a treatment for Alzheimer's, delay its onset and prevent it from developing.   What causes Alzheimer's? Experts agree that in the vast majority of cases, Alzheimer's, like other common chronic conditions, probably develops as a result of complex interactions among multiple factors, including age, genetics, environment, lifestyle and coexisting medical conditions. Although some risk factors -- such as age or genes -- cannot be changed, other risk factors -- such as high blood pressure and lack of exercise -- usually can be changed to help reduce risk. Research in these areas may lead to new ways to detect those at highest risk.  Prevention studies A small percentage of people with Alzheimer's disease (less than 1 percent) have an early-onset type associated with genetic mutations. Individuals who have these genetic mutations are guaranteed to develop the disease. An ongoing clinical trial conducted by the [facility] ([facility]), is testing whether antibodies to beta-amyloid can reduce the accumulation of beta-amyloid plaque in the brains of people with such genetic mutations and thereby reduce, delay or prevent symptoms. Participants in the trial are receiving antibodies (  or placebo) before they develop symptoms, and the development of beta-amyloid plaques is being monitored by brain scans and other tests.  Another clinical trial, known as the A4 trial  (Anti-Amyloid Treatment in Asymptomatic Alzheimer's), is testing whether antibodies to beta-amyloid can reduce the risk of Alzheimer's disease in older people (ages 31 to 22) at high risk for the disease. The A4 trial is being conducted by the [facility]'s [facility].  Though research is still evolving, evidence is strong that people can reduce their risk by making key lifestyle changes, including participating in regular activity and maintaining good heart health. Based on this research, the Alzheimer's Association offers 10 Ways to Love Your Brain -- a collection of tips that can reduce the risk of cognitive decline.  Heart-head connection  New research shows there are things we can do to reduce the risk of mild cognitive impairment and dementia.  Several conditions known to increase the risk of cardiovascular disease -- such as high blood pressure, diabetes and high cholesterol -- also increase the risk of developing Alzheimer's. Some autopsy studies show that as many as 60 percent of individuals with Alzheimer's disease also have cardiovascular disease.  A longstanding question is why some people develop hallmark Alzheimer's plaques and tangles but do not develop the symptoms of Alzheimer's. Vascular disease may help researchers eventually find an answer. Some autopsy studies suggest that plaques and tangles may be present in the brain without causing symptoms of cognitive decline unless the brain also shows evidence of vascular disease. More research is needed to better understand the link between vascular health and Alzheimer's.  Physical exercise and diet Regular physical exercise may be a beneficial strategy to lower the risk of Alzheimer's and vascular dementia. Exercise may directly benefit brain cells by increasing blood and oxygen flow in the brain. Because of its known cardiovascular benefits, a medically approved exercise program is a valuable part of any overall wellness  plan.  Current evidence suggests that heart-healthy eating may also help protect the brain. Heart-healthy eating includes limiting the intake of sugar and saturated fats and making sure to eat plenty of fruits, vegetables, and whole grains. No one diet is best. Two diets that have been studied and may be beneficial are the DASH (Dietary Approaches to Stop Hypertension) diet and the Mediterranean diet. The DASH diet emphasizes vegetables, fruits and fat-free or low-fat dairy products; includes whole grains, fish, poultry, beans, seeds, nuts and vegetable oils; and limits sodium, sweets, sugary beverages and red meats. A Mediterranean diet includes relatively little red meat and emphasizes whole grains, fruits and vegetables, fish and shellfish, and nuts, olive oil and other healthy fats.  Social connections and intellectual activity A number of studies indicate that maintaining strong social connections and keeping mentally active as we age might lower the risk of cognitive decline and Alzheimer's. Experts are not certain about the reason for this association. It may be due to direct mechanisms through which social and mental stimulation strengthen connections between nerve cells in the brain.  Head trauma There appears to be a strong link between future risk of Alzheimer's and serious head trauma, especially when injury involves loss of consciousness. You can help reduce your risk of Alzheimer's by protecting your head. . Wear a seat belt . Use a helmet when participating in sports . "Fall-proof" your home .  What you can do now While research is not yet conclusive, certain lifestyle choices, such as physical activity and diet, may help support brain  health and prevent Alzheimer's. Many of these lifestyle changes have been shown to lower the risk of other diseases, like heart disease and diabetes, which have been linked to Alzheimer's. With few drawbacks and plenty of known benefits, healthy lifestyle  choices can improve your health and possibly protect your brain.  Learn more about brain health. You can help increase our knowledge by considering participation in a clinical study. Our free clinical trial matching services, TrialMatch, can help you find clinical trials in your area that are seeking volunteers.  Understanding prevention research Here are some things to keep in mind about the research underlying much of our current knowledge about possible prevention: . Insights about potentially modifiable risk factors apply to large population groups, not to individuals. Studies can show that factor X is associated with outcome Y, but cannot guarantee that any specific person will have that outcome. As a result, you can "do everything right" and still have a serious health problem or "do everything wrong" and live to be 100. . Much of our current evidence comes from large epidemiological studies such as the [place] Aging Study, the Nurses' Health Study, the Adult Changes in Thought Study and the [employer] These studies explore pre-existing behaviors and use statistical methods to relate those behaviors to health outcomes. This type of study can show an "association" between a factor and an outcome but cannot "prove" cause and effect. This is why we describe evidence based on these studies with such language as "suggests," "may show," "might protect," and "is associated with." . The gold standard for showing cause and effect is a clinical trial in which participants are randomly assigned to a prevention or risk management strategy or a control group. Researchers follow the two groups over time to see if their outcomes differ significantly. . It is unlikely that some prevention or risk management strategies will ever be tested in randomized trials for ethical or practical reasons. One example is exercise. Definitively testing the impact of exercise on Alzheimer's risk would require a huge  trial enrolling thousands of people and following them for many years. The expense and logistics of such a trial would be prohibitive, and it would require some people to go without exercise, a known health benefit.      Memory Compensation Strategies  1. Use "WARM" strategy.  W= write it down  A= associate it  R= repeat it  M= make a mental note  2.   You can keep a Social worker.  Use a 3-ring notebook with sections for the following: calendar, important names and phone numbers,  medications, doctors' names/phone numbers, lists/reminders, and a section to journal what you did  each day.   3.    Use a calendar to write appointments down.  4.    Write yourself a schedule for the day.  This can be placed on the calendar or in a separate section of the Memory Notebook.  Keeping a  regular schedule can help memory.  5.    Use medication organizer with sections for each day or morning/evening pills.  You may need help loading it  6.    Keep a basket, or pegboard by the door.  Place items that you need to take out with you in the basket or on the pegboard.  You may also want to  include a message board for reminders.  7.    Use sticky notes.  Place sticky notes with reminders in a place where the task is  performed.  For example: " turn off the  stove" placed by the stove, "lock the door" placed on the door at eye level, " take your medications" on  the bathroom mirror or by the place where you normally take your medications.  8.    Use alarms/timers.  Use while cooking to remind yourself to check on food or as a reminder to take your medicine, or as a  reminder to make a call, or as a reminder to perform another task, etc. Management of Memory Problems  There are some general things you can do to help manage your memory problems.  Your memory may not in fact recover, but by using techniques and strategies you will be able to manage your memory difficulties better.  1)  Establish a  routine.  Try to establish and then stick to a regular routine.  By doing this, you will get used to what to expect and you will reduce the need to rely on your memory.  Also, try to do things at the same time of day, such as taking your medication or checking your calendar first thing in the morning.  Think about think that you can do as a part of a regular routine and make a list.  Then enter them into a daily planner to remind you.  This will help you establish a routine.  2)  Organize your environment.  Organize your environment so that it is uncluttered.  Decrease visual stimulation.  Place everyday items such as keys or cell phone in the same place every day (ie.  Basket next to front door)  Use post it notes with a brief message to yourself (ie. Turn off light, lock the door)  Use labels to indicate where things go (ie. Which cupboards are for food, dishes, etc.)  Keep a notepad and pen by the telephone to take messages  3)  Memory Aids  A diary or journal/notebook/daily planner  Making a list (shopping list, chore list, to do list that needs to be done)  Using an alarm as a reminder (kitchen timer or cell phone alarm)  Using cell phone to store information (Notes, Calendar, Reminders)  Calendar/White board placed in a prominent position  Post-it notes  In order for memory aids to be useful, you need to have good habits.  It's no good remembering to make a note in your journal if you don't remember to look in it.  Try setting aside a certain time of day to look in journal.  4)  Improving mood and managing fatigue.  There may be other factors that contribute to memory difficulties.  Factors, such as anxiety, depression and tiredness can affect memory.  Regular gentle exercise can help improve your mood and give you more energy.  Simple relaxation techniques may help relieve symptoms of anxiety  Try to get back to completing activities or hobbies you enjoyed doing in the  past.  Learn to pace yourself through activities to decrease fatigue.  Find out about some local support groups where you can share experiences with others.  Try and achieve 7-8 hours of sleep at night.

## 2020-08-08 NOTE — Progress Notes (Signed)
Elevated creatinine level.  Need this patient to hydrate very well before and after any contrast imaging. CBC, Vitamin B 12 in normal range.

## 2020-08-09 ENCOUNTER — Telehealth: Payer: Self-pay | Admitting: Neurology

## 2020-08-09 LAB — METHYLMALONIC ACID, SERUM: Methylmalonic Acid: 239 nmol/L (ref 0–378)

## 2020-08-09 LAB — COMPREHENSIVE METABOLIC PANEL
ALT: 22 IU/L (ref 0–44)
AST: 18 IU/L (ref 0–40)
Albumin/Globulin Ratio: 1.9 (ref 1.2–2.2)
Albumin: 4.8 g/dL (ref 3.8–4.9)
Alkaline Phosphatase: 86 IU/L (ref 44–121)
BUN/Creatinine Ratio: 11 (ref 10–24)
BUN: 14 mg/dL (ref 8–27)
Bilirubin Total: 0.6 mg/dL (ref 0.0–1.2)
CO2: 23 mmol/L (ref 20–29)
Calcium: 9.5 mg/dL (ref 8.6–10.2)
Chloride: 101 mmol/L (ref 96–106)
Creatinine, Ser: 1.29 mg/dL — ABNORMAL HIGH (ref 0.76–1.27)
Globulin, Total: 2.5 g/dL (ref 1.5–4.5)
Glucose: 83 mg/dL (ref 65–99)
Potassium: 4.3 mmol/L (ref 3.5–5.2)
Sodium: 140 mmol/L (ref 134–144)
Total Protein: 7.3 g/dL (ref 6.0–8.5)
eGFR: 63 mL/min/{1.73_m2} (ref >59)

## 2020-08-09 LAB — APOE ALZHEIMER'S RISK

## 2020-08-09 LAB — MULTIPLE MYELOMA PANEL, SERUM
Albumin SerPl Elph-Mcnc: 4.3 g/dL (ref 2.9–4.4)
Albumin/Glob SerPl: 1.5 (ref 0.7–1.7)
Alpha 1: 0.2 g/dL (ref 0.0–0.4)
Alpha2 Glob SerPl Elph-Mcnc: 0.7 g/dL (ref 0.4–1.0)
B-Globulin SerPl Elph-Mcnc: 1.2 g/dL (ref 0.7–1.3)
Gamma Glob SerPl Elph-Mcnc: 0.9 g/dL (ref 0.4–1.8)
Globulin, Total: 3 g/dL (ref 2.2–3.9)
IgA/Immunoglobulin A, Serum: 164 mg/dL (ref 90–386)
IgG (Immunoglobin G), Serum: 896 mg/dL (ref 603–1613)
IgM (Immunoglobulin M), Srm: 88 mg/dL (ref 20–172)

## 2020-08-09 LAB — CBC WITH DIFFERENTIAL/PLATELET
Basophils Absolute: 0 10*3/uL (ref 0.0–0.2)
Basos: 0 %
EOS (ABSOLUTE): 0.1 10*3/uL (ref 0.0–0.4)
Eos: 2 %
Hematocrit: 45.5 % (ref 37.5–51.0)
Hemoglobin: 16 g/dL (ref 13.0–17.7)
Immature Grans (Abs): 0 10*3/uL (ref 0.0–0.1)
Immature Granulocytes: 0 %
Lymphocytes Absolute: 1.8 10*3/uL (ref 0.7–3.1)
Lymphs: 35 %
MCH: 32.6 pg (ref 26.6–33.0)
MCHC: 35.2 g/dL (ref 31.5–35.7)
MCV: 93 fL (ref 79–97)
Monocytes Absolute: 0.7 10*3/uL (ref 0.1–0.9)
Monocytes: 13 %
Neutrophils Absolute: 2.5 10*3/uL (ref 1.4–7.0)
Neutrophils: 50 %
Platelets: 161 10*3/uL (ref 150–450)
RBC: 4.91 x10E6/uL (ref 4.14–5.80)
RDW: 13.1 % (ref 11.6–15.4)
WBC: 5.1 10*3/uL (ref 3.4–10.8)

## 2020-08-09 LAB — VITAMIN B12: Vitamin B-12: 360 pg/mL (ref 232–1245)

## 2020-08-09 LAB — HEMOGLOBIN A1C
Est. average glucose Bld gHb Est-mCnc: 108 mg/dL
Hgb A1c MFr Bld: 5.4 % (ref 4.8–5.6)

## 2020-08-09 LAB — TSH: TSH: 5.02 u[IU]/mL — ABNORMAL HIGH (ref 0.450–4.500)

## 2020-08-09 NOTE — Progress Notes (Signed)
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 Therefore the lifetime risk for AD is  increased in this individual. However, most individuals  with one copy of the APOE4 variant do not develop AD.   We will need a neuropsychological testing battery for memory testing.  Creatinine was elevated on the metabolic panel, this has been an ongoing finding for the last 5 years.

## 2020-08-09 NOTE — Telephone Encounter (Signed)
Called patient and advised of the lab findings that have been reviewed so far.  Advised the patient to drink plenty of water prior to his imaging.  He is scheduled for his MRI on Monday.  Advised that there are few labs that she had not reviewed yet but if they came back abnormal I will give him a call back. Pt verbalized understanding. Pt had no questions at this time but was encouraged to call back if questions arise.

## 2020-08-09 NOTE — Telephone Encounter (Signed)
-----   Message from Larey Seat, MD sent at 08/08/2020  2:48 PM EDT ----- Elevated creatinine level.  Need this patient to hydrate very well before and after any contrast imaging. CBC, Vitamin B 12 in normal range.

## 2020-08-15 ENCOUNTER — Other Ambulatory Visit: Payer: Self-pay

## 2020-08-15 ENCOUNTER — Ambulatory Visit
Admission: RE | Admit: 2020-08-15 | Discharge: 2020-08-15 | Disposition: A | Discharge status: Home / Self Care | Payer: 59 | Source: Ambulatory Visit | Attending: Neurology | Admitting: Neurology

## 2020-08-15 DIAGNOSIS — R4789 Other speech disturbances: Secondary | ICD-10-CM

## 2020-08-15 DIAGNOSIS — R413 Other amnesia: Secondary | ICD-10-CM

## 2020-08-15 DIAGNOSIS — G3184 Mild cognitive impairment, so stated: Secondary | ICD-10-CM

## 2020-08-15 MED ORDER — GADOBENATE DIMEGLUMINE 529 MG/ML IV SOLN
20.0000 mL | Freq: Once | INTRAVENOUS | Status: AC | PRN
  Administered 2020-08-15: 20 mL via INTRAVENOUS

## 2020-08-16 ENCOUNTER — Telehealth: Payer: Self-pay | Admitting: Neurology

## 2020-08-16 ENCOUNTER — Other Ambulatory Visit: Payer: Self-pay | Admitting: Neurology

## 2020-08-16 DIAGNOSIS — R413 Other amnesia: Secondary | ICD-10-CM

## 2020-08-16 DIAGNOSIS — R4789 Other speech disturbances: Secondary | ICD-10-CM

## 2020-08-16 DIAGNOSIS — G3184 Mild cognitive impairment, so stated: Secondary | ICD-10-CM

## 2020-08-16 NOTE — Telephone Encounter (Signed)
Called the patient and advised of the findings of the lab testing. As well as the MRI test results. Pt verbalized understanding and had no further questions. Pt is scheduled already in June for a follow up visit. Advised we will place the referral for neuropsych testing.     This MRI of the brain with and without contrast shows the following:  1.  Couple punctate T2/FLAIR hyperintense foci in the hemispheres most consistent with minimal, age-appropriate, chronic microvascular ischemic changes.  2.  Brain volume is normal for age.  3.  Normal enhancement pattern. No acute findings.

## 2020-08-16 NOTE — Telephone Encounter (Signed)
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 Therefore the lifetime risk for AD is  increased in this individual. However, most individuals  with one copy of the APOE4 variant do not develop AD.   We will need a neuropsychological testing battery for memory testing.  Creatinine was elevated on the metabolic panel, this has been an ongoing finding for the last 5 years.

## 2020-08-16 NOTE — Progress Notes (Signed)
IMPRESSION: This MRI of the brain with and without contrast shows the following: 1.    Couple punctate T2/FLAIR hyperintense foci in the hemispheres most consistent with minimal, age-appropriate, chronic microvascular ischemic changes. 2.    Brain volume is normal for age. 3.    Normal enhancement pattern.  No acute findings.     INTERPRETING PHYSICIAN:  Richard A. Felecia Shelling, MD, PhD, Charlynn Grimes

## 2020-08-22 ENCOUNTER — Encounter: Payer: Self-pay | Admitting: Psychology

## 2020-09-07 ENCOUNTER — Ambulatory Visit (INDEPENDENT_AMBULATORY_CARE_PROVIDER_SITE_OTHER): Payer: 59 | Admitting: Adult Health

## 2020-09-07 ENCOUNTER — Encounter: Payer: 59 | Admitting: Adult Health

## 2020-09-07 ENCOUNTER — Encounter: Payer: Self-pay | Admitting: Adult Health

## 2020-09-07 ENCOUNTER — Other Ambulatory Visit: Payer: Self-pay

## 2020-09-07 VITALS — BP 130/82 | HR 59 | Temp 98.4°F | Ht 71.5 in | Wt 198.6 lb

## 2020-09-07 DIAGNOSIS — Z0000 Encounter for general adult medical examination without abnormal findings: Secondary | ICD-10-CM | POA: Diagnosis not present

## 2020-09-07 DIAGNOSIS — Z125 Encounter for screening for malignant neoplasm of prostate: Secondary | ICD-10-CM

## 2020-09-07 DIAGNOSIS — C4491 Basal cell carcinoma of skin, unspecified: Secondary | ICD-10-CM

## 2020-09-07 DIAGNOSIS — Z1211 Encounter for screening for malignant neoplasm of colon: Secondary | ICD-10-CM

## 2020-09-07 DIAGNOSIS — F5101 Primary insomnia: Secondary | ICD-10-CM | POA: Diagnosis not present

## 2020-09-07 DIAGNOSIS — I10 Essential (primary) hypertension: Secondary | ICD-10-CM | POA: Diagnosis not present

## 2020-09-07 DIAGNOSIS — M1A0710 Idiopathic chronic gout, right ankle and foot, without tophus (tophi): Secondary | ICD-10-CM

## 2020-09-07 DIAGNOSIS — M2550 Pain in unspecified joint: Secondary | ICD-10-CM

## 2020-09-07 DIAGNOSIS — F339 Major depressive disorder, recurrent, unspecified: Secondary | ICD-10-CM

## 2020-09-07 DIAGNOSIS — E782 Mixed hyperlipidemia: Secondary | ICD-10-CM

## 2020-09-07 MED ORDER — MIRTAZAPINE 30 MG PO TABS: 30.0000 mg | ORAL_TABLET | Freq: Every day | ORAL | 1 refills | Status: DC

## 2020-09-07 MED ORDER — AMLODIPINE BESYLATE 2.5 MG PO TABS: 2.5000 mg | ORAL_TABLET | Freq: Every day | ORAL | 3 refills | Status: DC

## 2020-09-07 NOTE — Progress Notes (Signed)
Subjective:    Patient ID: Raymond Long, male    DOB: 1961-02-20, 60 y.o.   MRN: [id number]  HPI  Patient presents for yearly preventative medicine examination. He is a pleasant 60 year old male who  has a past medical history of Arthralgia of wrist, left, Cancer ([place]), Erectile dysfunction, Gout, Hyperlipidemia, Hypertension, Insomnia, and PONV (postoperative nausea and vomiting).  Hypertension -takes Norvasc 2.5 mg daily.  He denies dizziness, lightheadedness, chest pain, shortness of breath  BP Readings from Last 3 Encounters:  09/07/20 130/82  08/02/20 (!) 152/92  11/03/19 140/80    Hx of Gout - has not had a gout flare in multiple months   Arthritis -fully located in knees, hips, shoulders, and fingers.?  Psoriatic arthritis.  Has an appointment with rheumatology at the end of this month for further evaluation.  Insomnia - takes Remeron 15 mg and Atarax 25 mg - reports that lately he has been sleeping better. Getting 6-7 hours of sleep a night   Depression - feels as though this has worsened to some degree. Factors include his high stress job and increase in basal cell carcinoma/ basal cell carcinomas that dermatology has been treating. No SI or HI   Hyperlipidemia - has failed statin therapy and zetia - both caused increase in liver enzymes Lab Results  Component Value Date   CHOL 259 (H) 04/09/2019   HDL 30.30 (L) 04/09/2019   LDLCALC 194 (H) 04/09/2019   LDLDIRECT 176.9 06/09/2008   TRIG 170.0 (H) 04/09/2019   CHOLHDL 9 04/09/2019    All immunizations and health maintenance protocols were reviewed with the patient and needed orders were placed.  Appropriate screening laboratory values were ordered for the patient including screening of hyperlipidemia, renal function and hepatic function. If indicated by BPH, a PSA was ordered.  Medication reconciliation,  past medical history, social history, problem list and allergies were reviewed in detail with the  patient  Goals were established with regard to weight loss, exercise, and  diet in compliance with medications  He is due for colon cancer screening   Review of Systems  Constitutional: Negative.   HENT: Negative.   Eyes: Negative.   Respiratory: Negative.   Cardiovascular: Negative.   Gastrointestinal: Negative.   Endocrine: Negative.   Genitourinary: Negative.   Musculoskeletal: Positive for arthralgias.  Allergic/Immunologic: Negative.   Neurological: Negative.   Psychiatric/Behavioral: Positive for dysphoric mood and sleep disturbance. Negative for suicidal ideas. The patient is not nervous/anxious.   All other systems reviewed and are negative.     Past Medical History:  Diagnosis Date  . Arthralgia of wrist, left   . 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 Erectile dysfunction   . Gout    Foot  . Hyperlipidemia   . Hypertension   . Insomnia   . PONV (postoperative nausea and vomiting)     Social History   Socioeconomic History  . Marital status: Soil scientist    Spouse name: Not on file  . Number of children: Not on file  . Years of education: Not on file  . Highest education level: Not on file  Occupational History  . Not on file  Tobacco Use  . Smoking status: Former Smoker    Quit date: 01/09/2001    Years since quitting: 19.6  . Smokeless tobacco: Never Used  . Tobacco comment: 1978-2002, up to 2 ppd  Vaping Use  . Vaping Use: Never used  Substance and Sexual Activity  .  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 Drug use: No  . Sexual activity: Not on file  Other Topics Concern  . Not on file  Social History Narrative   He works in Product manager   No kids       [place] to be outside and hike.          Social Determinants of Health   Financial Resource Strain: Not on file  Food Insecurity: Not on file  Transportation Needs: Not on file  Physical Activity: Not on file  Stress: Not on file  Social Connections: Not on file  Intimate  Partner Violence: Not on file    Past Surgical History:  Procedure Laterality Date  . COLONOSCOPY     @ 49 due to rectal symptoms  . HERNIA REPAIR  2008   Bilateral  . 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 MELANOMA EXCISION WITH SENTINEL LYMPH NODE BIOPSY Left 01/15/2013   Procedure: EXCISION LEFT LEG MELANOMA WITH LEFT INGUINAL SENTINEL NODE BIOPSY;  Surgeon: Rolm Bookbinder, MD;  Location: [facility];  Service: General;  Laterality: Left;  . TONSILLECTOMY  age 26    Family History  Problem Relation Age of Onset  . Heart attack Mother        < 56  . Dementia Mother   . Heart attack Father 57  . COPD Sister   . Breast cancer Sister     Allergies  Allergen Reactions  . Allopurinol     Itching, redness, scaling to his skin and swelling in the ankle  . Atorvastatin   . Citalopram   . Ezetimibe-Simvastatin     REACTION: up cpk  . Fluoxetine Hcl   . Rosuvastatin     REACTION: up cpk  . Venlafaxine   . Seroquel [Quetiapine Fumarate] Palpitations    Racing thoughts and restless sleep  . Trazodone And Nefazodone Palpitations    Current Outpatient Medications on File Prior to Visit  Medication Sig Dispense Refill  . amLODipine (NORVASC) 2.5 MG tablet TAKE ONE TABLET BY MOUTH DAILY 30 tablet 2  . hydrOXYzine (ATARAX/VISTARIL) 25 MG tablet Take 25 mg by mouth at bedtime.    . imiquimod (ALDARA) 5 % cream SMARTSIG:1 Topical Daily PRN    . mirtazapine (REMERON) 15 MG tablet Take 1 tablet (15 mg total) by mouth at bedtime. 90 tablet 0  . OVER THE COUNTER MEDICATION Nicatinamide 500 mg po qhs    . predniSONE (DELTASONE) 5 MG tablet Take 5 mg by mouth daily as needed (for flare up).     No current facility-administered medications on file prior to visit.    BP 130/82 (BP Location: Left Arm, Patient Position: Sitting, Cuff Size: Large)   Pulse (!) 59   Temp 98.4 F (36.9 C) (Oral)   Ht 5' 11.5" (1.816 m)   Wt 198 lb 9.6 oz (90.1 kg)    SpO2 96%   BMI 27.31 kg/m       Objective:   Physical Exam Vitals and nursing note reviewed.  Constitutional:      General: He is not in acute distress.    Appearance: Normal appearance. He is well-developed and normal weight.  HENT:     Head: Normocephalic and atraumatic.     Right Ear: Tympanic membrane, ear canal and external ear normal. There is no impacted cerumen.     Left Ear: Tympanic membrane, ear canal  and external ear normal. There is no impacted cerumen.     Nose: Nose normal. No congestion or rhinorrhea.     Mouth/Throat:     Mouth: Mucous membranes are moist.     Pharynx: Oropharynx is clear. No oropharyngeal exudate or posterior oropharyngeal erythema.  Eyes:     General:        Right eye: No discharge.        Left eye: No discharge.     Extraocular Movements: Extraocular movements intact.     Conjunctiva/sclera: Conjunctivae normal.     Pupils: Pupils are equal, round, and reactive to light.  Neck:     Vascular: No carotid bruit.     Trachea: No tracheal deviation.  Cardiovascular:     Rate and Rhythm: Normal rate and regular rhythm.     Pulses: Normal pulses.     Heart sounds: Normal heart sounds. No murmur heard. No friction rub. No gallop.   Pulmonary:     Effort: Pulmonary effort is normal. No respiratory distress.     Breath sounds: Normal breath sounds. No stridor. No wheezing, rhonchi or rales.  Chest:     Chest wall: No tenderness.  Abdominal:     General: Bowel sounds are normal. There is no distension.     Palpations: Abdomen is soft. There is no mass.     Tenderness: There is no abdominal tenderness. There is no right CVA tenderness, left CVA tenderness, guarding or rebound.     Hernia: No hernia is present.  Musculoskeletal:        General: No swelling, tenderness, deformity or signs of injury. Normal range of motion.     Right lower leg: No edema.     Left lower leg: No edema.  Lymphadenopathy:     Cervical: No cervical adenopathy.   Skin:    General: Skin is warm and dry.     Capillary Refill: Capillary refill takes less than 2 seconds.     Coloration: Skin is not jaundiced or pale.     Findings: Rash (psorias) present. No bruising, erythema or lesion.  Neurological:     General: No focal deficit present.     Mental Status: He is alert and oriented to person, place, and time.     Cranial Nerves: No cranial nerve deficit.     Sensory: No sensory deficit.     Motor: No weakness.     Coordination: Coordination normal.     Gait: Gait normal.     Deep Tendon Reflexes: Reflexes normal.  Psychiatric:        Mood and Affect: Mood normal.        Behavior: Behavior normal.        Thought Content: Thought content normal.        Judgment: Judgment normal.        Assessment & Plan:  1. Routine general medical examination at a health care facility - Follow up in one year or sooner if needed - Encouraged exercise and heart healthy diet  - CBC with Differential/Platelet; Future - Comprehensive metabolic panel; Future - Lipid panel; Future - TSH; Future  2. Essential hypertension - Controlled. No change in medication  - amLODipine (NORVASC) 2.5 MG tablet; Take 1 tablet (2.5 mg total) by mouth daily.  Dispense: 90 tablet; Refill: 3 - mirtazapine (REMERON) 30 MG tablet; Take 1 tablet (30 mg total) by mouth at bedtime.  Dispense: 90 tablet; Refill: 1 - CBC with Differential/Platelet; Future - Comprehensive metabolic  panel; Future - Lipid panel; Future - TSH; Future  3. Primary insomnia  - mirtazapine (REMERON) 30 MG tablet; Take 1 tablet (30 mg total) by mouth at bedtime.  Dispense: 90 tablet; Refill: 1  4. Colon cancer screening  - Ambulatory referral to Gastroenterology  5. Prostate cancer screening  - PSA; Future  6. Mixed hyperlipidemia - Consider referral to Lipid Clinic  - CBC with Differential/Platelet; Future - Comprehensive metabolic panel; Future - Lipid panel; Future - TSH; Future  7. Chronic  gout of right ankle, unspecified cause - No recent flares  8. Skin cancer, basal cell - Follow up with Dermatology as directed  9. Polyarthralgia - Follow up with Rhemuatology as directed  10. Depression, recurrent ([place]) - I am ok with increasing Remeron from 15 mg to 30 mg d/t depression. He will follow up with me via Mychart in 30 days to let me know how he is doing.  - mirtazapine (REMERON) 30 MG tablet; Take 1 tablet (30 mg total) by mouth at bedtime.  Dispense: 90 tablet; Refill: 1   Dorothyann Peng, NP

## 2020-09-07 NOTE — Patient Instructions (Signed)
It was great seeing you today!  Someone will call you to schedule your colonoscopy   I have sent your medications in   Please schedule a lab appointment at the front desk   I will see you back in one year or sooner if needed

## 2020-09-08 ENCOUNTER — Other Ambulatory Visit (INDEPENDENT_AMBULATORY_CARE_PROVIDER_SITE_OTHER): Payer: 59

## 2020-09-08 DIAGNOSIS — E782 Mixed hyperlipidemia: Secondary | ICD-10-CM | POA: Diagnosis not present

## 2020-09-08 DIAGNOSIS — Z125 Encounter for screening for malignant neoplasm of prostate: Secondary | ICD-10-CM

## 2020-09-08 DIAGNOSIS — Z0000 Encounter for general adult medical examination without abnormal findings: Secondary | ICD-10-CM

## 2020-09-08 DIAGNOSIS — I10 Essential (primary) hypertension: Secondary | ICD-10-CM | POA: Diagnosis not present

## 2020-09-08 LAB — CBC WITH DIFFERENTIAL/PLATELET
Basophils Absolute: 0 10*3/uL (ref 0.0–0.1)
Basophils Relative: 0.3 % (ref 0.0–3.0)
Eosinophils Absolute: 0.1 10*3/uL (ref 0.0–0.7)
Eosinophils Relative: 2.3 % (ref 0.0–5.0)
HCT: 44.7 % (ref 39.0–52.0)
Hemoglobin: 15.2 g/dL (ref 13.0–17.0)
Lymphocytes Relative: 48.1 % — ABNORMAL HIGH (ref 12.0–46.0)
Lymphs Abs: 2.1 10*3/uL (ref 0.7–4.0)
MCHC: 34 g/dL (ref 30.0–36.0)
MCV: 92.3 fl (ref 78.0–100.0)
Monocytes Absolute: 0.6 10*3/uL (ref 0.1–1.0)
Monocytes Relative: 13.8 % — ABNORMAL HIGH (ref 3.0–12.0)
Neutro Abs: 1.5 10*3/uL (ref 1.4–7.7)
Neutrophils Relative %: 35.5 % — ABNORMAL LOW (ref 43.0–77.0)
Platelets: 152 10*3/uL (ref 150.0–400.0)
RBC: 4.84 Mil/uL (ref 4.22–5.81)
RDW: 12.9 % (ref 11.5–15.5)
WBC: 4.4 10*3/uL (ref 4.0–10.5)

## 2020-09-08 LAB — COMPREHENSIVE METABOLIC PANEL
ALT: 20 U/L (ref 0–53)
AST: 16 U/L (ref 0–37)
Albumin: 4.5 g/dL (ref 3.5–5.2)
Alkaline Phosphatase: 80 U/L (ref 39–117)
BUN: 20 mg/dL (ref 6–23)
CO2: 28 mEq/L (ref 19–32)
Calcium: 9.2 mg/dL (ref 8.4–10.5)
Chloride: 103 mEq/L (ref 96–112)
Creatinine, Ser: 1.28 mg/dL (ref 0.40–1.50)
GFR: 60.92 mL/min (ref >60.00)
Glucose, Bld: 95 mg/dL (ref 70–99)
Potassium: 4.5 mEq/L (ref 3.5–5.1)
Sodium: 139 mEq/L (ref 135–145)
Total Bilirubin: 0.6 mg/dL (ref 0.2–1.2)
Total Protein: 6.9 g/dL (ref 6.0–8.3)

## 2020-09-08 LAB — TSH: TSH: 5.19 u[IU]/mL — ABNORMAL HIGH (ref 0.35–4.50)

## 2020-09-08 LAB — LIPID PANEL
Cholesterol: 256 mg/dL — ABNORMAL HIGH (ref 0–200)
HDL: 28 mg/dL — ABNORMAL LOW (ref >39.00)
LDL Cholesterol: 198 mg/dL — ABNORMAL HIGH (ref 0–99)
NonHDL: 227.57
Total CHOL/HDL Ratio: 9
Triglycerides: 147 mg/dL (ref 0.0–149.0)
VLDL: 29.4 mg/dL (ref 0.0–40.0)

## 2020-09-08 LAB — PSA: PSA: 1.5 ng/mL (ref 0.10–4.00)

## 2020-09-09 ENCOUNTER — Telehealth: Payer: Self-pay | Admitting: Adult Health

## 2020-09-09 DIAGNOSIS — E782 Mixed hyperlipidemia: Secondary | ICD-10-CM

## 2020-09-09 NOTE — Telephone Encounter (Signed)
Updated patient on his labs, cholesterol panel continues to be elevated, unfortunately he is statin intolerant as well as intolerant to Zetia, both made his liver enzymes increased.  We will refer him to lipid clinic for further evaluation

## 2020-09-28 ENCOUNTER — Ambulatory Visit: Payer: 59 | Admitting: Internal Medicine

## 2020-10-05 NOTE — Progress Notes (Signed)
Office Visit Note  Patient: Raymond Long             Date of Birth: 1961/04/27           MRN: [id number]             PCP: Dorothyann Peng, NP Referring: Lajuana Ripple, MD Visit Date: 10/06/2020 Occupation: Merchant navy officer  Subjective:  New Patient (Initial Visit) (Rash, difficulty walking, history of gout, previous rheumatologist x 2 years, bil foot pain, bil hip pain, bil hand pain, bil shoulder pain, patient on gluten free diet - inflammation reduced, more energy, improving)   History of Present Illness: Raymond Long is a 60 y.o. male here for evaluation for possible psoriatic arthritis. He has joint pain in multiple sites and previous history of gout, without recent flare reported. He was recently seen with [facility] on 08/11/20 with complete skin exam and biopsy of multiple sites.  He has a long history of generally high physical activity with running and a lot of manual outdoor labor but has not really suffered a lot of problems with joint pain until about 3 years ago.  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  Since 3 years ago developed episodic severe pain and swelling in the forefoot bilaterally found to represent gout for which he was treated on allopurinol later colchicine and as needed prednisone.  He found the largest improvement in symptoms when discontinuing the nicotinamide and vitamin B supplementation that he has been taking.  He is now off any urate lowering therapy and has not experienced continued gout flares.  He has however noticed a more chronic diffuse joint pain and stiffness in his bilateral shoulders, hips, hands, and feet that is worst first thing in the morning with about 15 minutes of stiffness but also feels some tightness throughout the day.  He occasionally uses nonsteroidal anti-inflammatory medicine that is helpful for symptoms but tries not to take this at high  doses or chronically due to potential side effects.  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  Activities of Daily Living:  Patient reports morning stiffness for 15 minutes.   Patient Denies nocturnal pain.  Difficulty dressing/grooming: Denies Difficulty climbing stairs: Denies Difficulty getting out of chair: Reports Difficulty using hands for taps, buttons, cutlery, and/or writing: Reports  Review of Systems  Constitutional: Positive for fatigue.  HENT: Positive for mouth dryness.   Eyes: Positive for dryness.  Respiratory: Positive for shortness of breath.   Cardiovascular: Positive for swelling in legs/feet.  Gastrointestinal: Negative for constipation.  Endocrine: Positive for excessive thirst and increased urination.  Genitourinary: Negative for difficulty urinating.  Musculoskeletal: Positive for arthralgias, gait problem, joint pain, joint swelling and morning stiffness.  Skin: Positive for rash.  Allergic/Immunologic: Negative for susceptible to infections.  Neurological: Negative for numbness.  Hematological: Positive for bruising/bleeding tendency.  Psychiatric/Behavioral: Positive for sleep disturbance.    PMFS History:  Patient Active Problem List   Diagnosis Date Noted  . Rash and other nonspecific skin eruption 10/06/2020  . History of revision of total replacement of left hip joint 01/05/2019  . Insomnia 05/29/2017  . OSA (obstructive sleep apnea) 05/29/2017  . Gastroesophageal reflux with apnea 05/29/2017  . Generalized headache 01/25/2017  . Testicular pain, left 04/26/2016  . Routine general medical  examination at a health care facility 10/12/2015  . Essential hypertension 09/19/2015  . Malignant melanoma of skin of left thigh ([place]) 04/06/2013  . Skin cancer, basal cell 10/28/2012  . Fasting hyperglycemia  10/28/2012  . Other abnormal glucose 10/23/2012  . GOUT 10/11/2008  . ERECTILE DYSFUNCTION 06/15/2008  . Pain in joint 06/15/2008  . Hyperlipidemia 04/23/2008  . CARPAL TUNNEL SYNDROME 04/23/2008  . INGUINAL PAIN, LEFT 11/20/2006    Past Medical History:  Diagnosis Date  . Arthralgia of wrist, left   . 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 Erectile dysfunction   . Gout    Foot  . Hyperlipidemia   . Hypertension   . Insomnia   . PONV (postoperative nausea and vomiting)     Family History  Problem Relation Age of Onset  . Heart attack Mother        < 58  . Dementia Mother   . Heart attack Father 79  . COPD Sister   . Breast cancer Sister    Past Surgical History:  Procedure Laterality Date  . COLONOSCOPY     @ 49 due to rectal symptoms  . HERNIA REPAIR  2008   Bilateral  . 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 MELANOMA EXCISION WITH SENTINEL LYMPH NODE BIOPSY Left 01/15/2013   Procedure: EXCISION LEFT LEG MELANOMA WITH LEFT INGUINAL SENTINEL NODE BIOPSY;  Surgeon: Rolm Bookbinder, MD;  Location: [facility];  Service: General;  Laterality: Left;  . TONSILLECTOMY  age 2  . TOTAL HIP ARTHROPLASTY     Social History   Social History Narrative   He works in Product manager   No kids       [place] to be outside and hike.          Immunization History  Administered Date(s) Administered  . Influenza,inj,Quad PF,6+ Mos 01/25/2017, 04/09/2019  . Moderna Sars-Covid-2 Vaccination 07/21/2019, 08/18/2019  . Tdap 04/09/2019     Objective: Vital Signs: BP (!) 146/76 (BP Location: Right Arm, Patient Position: Sitting, Cuff Size: Normal)   Pulse 62   Resp 15   Ht 5' 11" (1.803 m)   Wt 199 lb (90.3 kg)   BMI 27.75 kg/m    Physical Exam HENT:     Right Ear: External ear normal.     Left Ear: External ear normal.     Mouth/Throat:     Mouth: Mucous membranes are moist.     Pharynx: Oropharynx is clear.  Eyes:      Conjunctiva/sclera: Conjunctivae normal.  Cardiovascular:     Rate and Rhythm: Normal rate and regular rhythm.  Pulmonary:     Effort: Pulmonary effort is normal.     Breath sounds: Normal breath sounds.  Skin:    General: Skin is warm and dry.     Comments: Numerous skin lesions: Left neck healing erythematous patch with central scab, scattered erythematous raised patches on back with some unroofed areas no scaling, normal appearing fingernails, healing anterior shin hyperpigmented patches less than 3 cm diameter  Neurological:     General: No focal deficit present.     Mental Status: He is alert.  Psychiatric:        Mood and Affect: Mood normal.     Musculoskeletal Exam:  Shoulders full ROM no swelling, tenderness with abduction overhead Elbows full ROM no tenderness or swelling Wrists full ROM no tenderness or swelling Fingers full ROM no tenderness  or swelling, flexor tendon nodules present proximal to 4th digit on volar surface b/l Anterior left hip pain with Pace maneuver, right hip lateral pain with rotation and FABER maneuver Knees full ROM no tenderness or swelling, patellofemoral crepitus present Ankles full ROM no tenderness or swelling MTPs 1st MTP bony enlargement severely reduced flexion and extension ROM, R worse than L, slightly cocked up deformity of 2-3th digits   Investigation: No additional findings.  Imaging: XR Foot 2 Views Left  Result Date: 10/07/2020 X-ray left foot 2 views Tibiotalar joint space appears normal.  Calcaneus appears normal.  Midfoot joints appear normal.  Advanced arthritis of the first MTP joint with severe narrowing and bony deposition especially medially possible small erosion versus surface irregularity.  Other MTP joints appear normal. Impression Advanced first MTP joint arthritis otherwise normal joint appearance  XR Foot 2 Views Right  Result Date: 10/07/2020 X-ray right foot 2 views Tibiotalar joint space appears normal.  Some  probable posterior calcaneal enthesophyte formation.  Mild midfoot dorsal and lateral degenerative joint changes.  Extensive first MTP joint arthritis with joint narrowing with sclerosis and lateral osteophyte formation also extensive periosteal deposition around head of first metatarsal with some punched out erosive changes.  Other MTP joints appear normal. Impression Mild OA except for extensive first MTP changes consistent with history of previous gouty arthritis  XR Hand 2 View Left  Result Date: 10/07/2020 X-ray left hand 2 views Radiocarpal joint space appears normal.  Large probable cystic change in distal ulna lunate and carpal bones.  Mild first CMC degenerative change.  MCP joint spaces appear normal.  Mild PIP and DIP joint changes no large osteophytes and no erosive changes are seen.  No periosteal reaction is present.  Bone mineralization appears normal. Impression No significant inflammatory joint changes are seen and mild degenerative changes  XR Hand 2 View Right  Result Date: 10/07/2020 X-ray right hand 2 views Radiocarpal joint space appears normal.  Carpal bones look intact there is moderate first CMC joint degenerative arthritis change.  MCP joint spaces intact with some osteophyte formation and probable small hook osteophyte of third metacarpal head with cystic change in metacarpal heads 2 and 3.  Some joint narrowing and cortical irregularity at DIP joints without any obvious erosion.  No periosteal reaction is seen and bone mineralization appears normal. Impression Degenerative arthritic changes in first [facility] joint MCP joints and DIP joints look consistent with osteoarthritis but distribution may be suggestive of secondary causes   Recent Labs: Lab Results  Component Value Date   WBC 4.4 09/08/2020   HGB 15.2 09/08/2020   PLT 152.0 09/08/2020   NA 139 09/08/2020   K 4.5 09/08/2020   CL 103 09/08/2020   CO2 28 09/08/2020   GLUCOSE 95 09/08/2020   BUN 20 09/08/2020    CREATININE 1.28 09/08/2020   BILITOT 0.6 09/08/2020   ALKPHOS 80 09/08/2020   AST 16 09/08/2020   ALT 20 09/08/2020   PROT 6.9 09/08/2020   ALBUMIN 4.5 09/08/2020   CALCIUM 9.2 09/08/2020   GFRAA 82 (L) 01/12/2013    Speciality Comments: No specialty comments available.  Procedures:  No procedures performed Allergies: Allopurinol, Atorvastatin, Citalopram, Ezetimibe-simvastatin, Fluoxetine hcl, Rosuvastatin, Venlafaxine, Seroquel [quetiapine fumarate], and Trazodone and nefazodone   Assessment / Plan:     Visit Diagnoses: Arthralgia, unspecified joint - Plan: XR Hand 2 View Right, XR Hand 2 View Left, XR Foot 2 Views Right, XR Foot 2 Views Left, Sedimentation rate, C-reactive protein, HLA-B27  antigen, IgG, IgA, IgM, Tissue transglutaminase, IgA, Gliadin antibodies, serum  Joint pain of multiple areas with no obvious inflammatory changes seen on exam today though he does describe features such as pain worse in the morning with rest improvement with activity that can be suggestive for inflammatory cause.  Symptoms could be related to generalized osteoarthritis.  He does not present with any skin disease confirmed to represent psoriasis based on biopsy pathology although does have extensive skin disease.  We will check x-rays of bilateral hands and feet for evidence of inflammatory or degenerative arthritic changes.  Checking sedimentation rate and CRP for inflammation markers.  We will check TTG IgA and creatinine also HLA-B27 consideration of celiac disease or spondylarthritis.  Consider options of anti-inflammatory treatment versus immunosuppression based on findings would also need to consider skin disease history.  Chronic gout of right ankle, unspecified cause - Plan: Uric acid  History of chronic gout there is extensive degenerative change and bony enlargement of both first MTP joints that would be consistent with a prior history of gout.  He denies current ongoing flares we will recheck  uric acid level today on his current diet and lifestyle.  History of revision of total replacement of left hip joint  Left total hip arthroplasty due to osteoarthritis and currently no complications.  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  Rash and other nonspecific skin eruption - Plan: Sedimentation rate, C-reactive protein, HLA-B27 antigen, IgG, IgA, IgM, Tissue transglutaminase, IgA, Gliadin antibodies, serum  Extensive skin disease currently following up with dermatology for these none particularly identified as psoriatic does not have any nail dystrophy suggesting psoriasis.  Not sure about the numerous lesions on his back currently testing for something such as celiac at this time.  Orders: Orders Placed This Encounter  Procedures  . XR Hand 2 View Right  . XR Hand 2 View Left  . XR Foot 2 Views Right  . XR Foot 2 Views Left  . Sedimentation rate  . C-reactive protein  . Uric acid  . HLA-B27 antigen  . IgG, IgA, IgM  . Tissue transglutaminase, IgA  . Gliadin antibodies, serum   No orders of the defined types were placed in this encounter.    Follow-Up Instructions: Return in about 2 weeks (around 10/20/2020) for New pt f/u arthritis, possible PsA, ttg.   Collier Salina, MD  Note - This record has been created using [employer].  Chart creation errors have been sought, but may not always  have been located. Such creation errors do not reflect on  the standard of medical care.

## 2020-10-06 ENCOUNTER — Ambulatory Visit (INDEPENDENT_AMBULATORY_CARE_PROVIDER_SITE_OTHER): Payer: 59 | Admitting: Internal Medicine

## 2020-10-06 ENCOUNTER — Ambulatory Visit: Payer: Self-pay

## 2020-10-06 ENCOUNTER — Other Ambulatory Visit: Payer: Self-pay

## 2020-10-06 ENCOUNTER — Encounter: Payer: Self-pay | Admitting: Internal Medicine

## 2020-10-06 VITALS — BP 146/76 | HR 62 | Resp 15 | Ht 71.0 in | Wt 199.0 lb

## 2020-10-06 DIAGNOSIS — Z96642 Presence of left artificial hip joint: Secondary | ICD-10-CM

## 2020-10-06 DIAGNOSIS — M2550 Pain in unspecified joint: Secondary | ICD-10-CM

## 2020-10-06 DIAGNOSIS — M79671 Pain in right foot: Secondary | ICD-10-CM

## 2020-10-06 DIAGNOSIS — M79641 Pain in right hand: Secondary | ICD-10-CM

## 2020-10-06 DIAGNOSIS — M79642 Pain in left hand: Secondary | ICD-10-CM

## 2020-10-06 DIAGNOSIS — C4372 Malignant melanoma of left lower limb, including hip: Secondary | ICD-10-CM

## 2020-10-06 DIAGNOSIS — M79672 Pain in left foot: Secondary | ICD-10-CM | POA: Diagnosis not present

## 2020-10-06 DIAGNOSIS — M1A0710 Idiopathic chronic gout, right ankle and foot, without tophus (tophi): Secondary | ICD-10-CM | POA: Diagnosis not present

## 2020-10-06 DIAGNOSIS — R21 Rash and other nonspecific skin eruption: Secondary | ICD-10-CM

## 2020-10-06 NOTE — Patient Instructions (Addendum)
Celiac Disease Antibodies Test Why am I having this test? The celiac disease antibodies test is a blood test that is done to help diagnose celiac disease. People who have celiac disease cannot tolerate the proteins gluten and gliadin, which are found in wheat and wheat products. You may have the test if you have symptoms of celiac disease. The symptoms include:  Long-term (chronic) diarrhea.  Belly (abdominal) pain.  Weight loss. You may also have this test if you have increased risk for celiac disease, such as having a close family member who has the disease. What is being tested? This test examines your blood for the presence of specific antibodies that are common to celiac disease. Antibodies are proteins that your body normally makes to protect itself from germs that can make you sick. In people who have celiac disease, the body produces antibodies in response to the proteins gluten and gliadin. What kind of sample is taken? A blood sample is required for this test. It is usually collected by inserting a needle into a blood vessel.   How do I prepare for this test? You may be asked to provide a list of foods that you have eaten in the 48 hours before the test. If you have eaten foods that contain gluten, the test will show a strong antibody response if you do have celiac disease. Check with your health care provider for specific instructions. How are the results reported? Some of your test results will be reported as values in EU (European units of measurement). Your health care provider will compare your results to normal ranges that were established after testing a large group of people (reference ranges). Reference ranges may vary among labs and hospitals. For this test, common reference ranges for the three common celiac disease antibodies are as follows:  Gliadin IgA and IgG: ? Birth to 60 years of age: less than 57 EU. ? 19 years of age and older: less than 25 EU.  Tissue  transglutaminase IgA, all ages: less than 20 EU. Other results will be reported as positive or negative. For this test, normal results are:  Negative for endomysial IgA, all ages. What do the results mean? High levels of antibodies or a positive result may mean that you have celiac disease. High levels of gliadin antibodies may also be caused by Crohn's disease, colitis, and severe lactose intolerance. Talk with your health care provider about what your results mean. Questions to ask your health care provider Ask your health care provider, or the department that is doing the test:  When will my results be ready?  How will I get my results?  What are my treatment options?  What other tests do I need?  What are my next steps? Summary  The celiac disease antibodies test is a blood test that is done to help diagnose celiac disease.  This test examines your blood for the presence of specific antibodies that are common in people who have celiac disease.  The presence of certain antibodies, or antibody levels that are above the normal range, may indicate that you have celiac disease.  Talk with your health care provider about what your results mean.    Human Leukocyte Antigen B27 Test Why am I having this test? The human leukocyte antigen B27 (HLA-B27) test is performed to help your health care provider diagnose certain autoimmune diseases, especially an autoimmune disease called ankylosing spondylitis. Autoimmune diseases are caused by an abnormality in your body's defense (immune) system. Normally, your  immune system attacks germs and other things that can make you sick. When you have an autoimmune disease, your immune system attacks the normal cells of your body instead. Ankylosing spondylitis causes long-term (chronic) pain and stiffness in the spine and hips and can also affect other parts of the body, such as the eyes and gastrointestinal tract. What is being tested? This test  checks your blood for the presence of HLA-B27, which is a protein found on the surface of white blood cells. HLA-B27 is commonly found in people who have ankylosing spondylitis. What kind of sample is taken? A blood sample is required for this test. It is usually collected by inserting a needle into a blood vessel.   How are the results reported? Your test results will be reported as either positive or negative for the presence of HLA-B27 in your blood. What do the results mean?  A negative result means that you are less likely to have ankylosing spondylitis or certain other autoimmune diseases. However, having a negative result does not mean that you do not have one of these diseases.  A positive result means that you are more likely to have: ? Ankylosing spondylitis. ? Other autoimmune diseases, such as:  Rheumatoid arthritis.  Reactive arthritis.  Psoriasis or psoriatic arthritis.  Inflammation of the eyes (uveitis).  Inflammatory bowel disease. HLA-B27 is normally found in a small number of people without a disease. Having HLA-B27 does not always mean that you have an autoimmune disease. Your health care provider will use these test results along with other test results and your medical history to make a diagnosis. Talk with your health care provider about what your results mean. Questions to ask your health care provider Ask your health care provider, or the department that is doing the test:  When will my results be ready?  How will I get my results?  What are my treatment options?  What other tests do I need?  What are my next steps? Summary  The human leukocyte antigen B27 (HLA-B27) test is performed to help your health care provider diagnose a condition called ankylosing spondylitis.  Ankylosing spondylitis causes long-term (chronic) pain and stiffness in the spine and hips and can also affect other parts of the body, such as the eyes and gastrointestinal tract.  A  negative result means that you are less likely to have ankylosing spondylitis, while a positive result means that you are more likely to have this condition or another autoimmune condition.  Talk with your health care provider about what your results mean.   Erythrocyte Sedimentation Rate Test Why am I having this test? The erythrocyte sedimentation rate (ESR) test is used to help find illnesses related to:  Sudden (acute) or long-term (chronic) infections.  Inflammation.  The body's disease-fighting system attacking healthy cells (autoimmune diseases).  Cancer.  Tissue death. If you have symptoms that may be related to any of these illnesses, your health care provider may do an ESR test before doing more specific tests. If you have an inflammatory immune disease, such as rheumatoid arthritis, you may have this test to help monitor your therapy. What is being tested? This test measures how long it takes for your red blood cells (erythrocytes) to settle in a solution over a certain amount of time (sedimentation rate). When you have an infection or inflammation, your red blood cells clump together and settle faster. The sedimentation rate provides information about how much inflammation is present in the body. What kind of  sample is taken? A blood sample is required for this test. It is usually collected by inserting a needle into a blood vessel.   How do I prepare for this test? Follow any instructions from your health care provider about changing or stopping your regular medicines. Tell a health care provider about:  Any allergies you have.  All medicines you are taking, including vitamins, herbs, eye drops, creams, and over-the-counter medicines.  Any blood disorders you have.  Any surgeries you have had.  Any medical conditions you have, such as thyroid or kidney disease.  Whether you are pregnant or may be pregnant. How are the results reported? Your results will be reported  as a value that measures sedimentation rate in millimeters per hour (mm/hr). Your health care provider will compare your results to normal ranges that were established after testing a large group of people (reference values). Reference values may vary among labs and hospitals. For this test, common reference values, which vary by age and gender, are:  Newborn: 0-2 mm/hr.  Child, up to puberty: 0-10 mm/hr.  Male: ? Under 50 years: 0-20 mm/hr. ? 50-85 years: 0-30 mm/hr. ? Over 85 years: 0-42 mm/hr.  Male: ? Under 50 years: 0-15 mm/hr. ? 50-85 years: 0-20 mm/hr. ? Over 85 years: 0-30 mm/hr. Certain conditions or medicines may cause ESR levels to be falsely lower or higher, such as:  Pregnancy.  Obesity.  Steroids, birth control pills, and blood thinners.  Thyroid or kidney disease. What do the results mean? Results that are within reference values are considered normal, meaning that the level of inflammation in your body is healthy. High ESR levels mean that there is inflammation in your body. You will have more tests to help make a diagnosis. Inflammation may result from many different conditions or injuries. Talk with your health care provider about what your results mean. Questions to ask your health care provider Ask your health care provider, or the department that is doing the test:  When will my results be ready?  How will I get my results?  What are my treatment options?  What other tests do I need?  What are my next steps? Summary  The erythrocyte sedimentation rate (ESR) test is used to help find illnesses associated with sudden (acute) or long-term (chronic) infections, inflammation, autoimmune diseases, cancer, or tissue death.  If you have symptoms that may be related to any of these illnesses, your health care provider may do an ESR test before doing more specific tests. If you have an inflammatory immune disease, such as rheumatoid arthritis, you may have this  test to help monitor your therapy.  This test measures how long it takes for your red blood cells (erythrocytes) to settle in a solution over a certain amount of time (sedimentation rate). This provides information about how much inflammation is present in the body.   C-Reactive Protein Test Why am I having this test? The C-reactive protein (CRP) is a substance that the liver releases in response to inflammation within the body. You may have a CRP test to help diagnose:  Serious bacterial or fungal infections.  Inflammatory diseases, such as inflammatory bowel disease.  Lupus, rheumatoid arthritis, or other autoimmune diseases. What is being tested? This test checks for the level of CRP in your blood. The level of CRP in your body increases greatly after a heart attack, infection, or injury. What kind of sample is taken? A blood sample is required for this test. It is usually  collected by inserting a needle into a blood vessel.   Tell a health care provider about:  All medicines you are taking, including vitamins, herbs, eye drops, creams, and over-the-counter medicines.  Any medical conditions you have.  The use of birth control pills or hormone replacement therapy such as estrogen.  Any blood disorders you have.  Whether you are pregnant or may be pregnant. How are the results reported? Your test results will be reported as a value that indicates how much CRP is in your blood. This will be reported as milligrams of CRP per liter (mg/L) of blood. Your health care provider will compare your results to normal ranges that were established after testing a large group of people (reference ranges). Reference ranges may vary among labs and hospitals. For this test, the standard CRP reference value is less than 10 mg/L. What do the results mean? A standard CRP test result that is less than 10 mg/L is considered normal, meaning that you do not have an abnormally high level of inflammation in  your body. A standard CRP test result that is greater than 10 mg/L means that there is an abnormally high level of inflammation in your body that is causing CRP to be released. Inflammation may result from many different conditions or injuries. You will have more tests to help make a diagnosis. Talk with your health care provider about what your results mean. Questions to ask your health care provider Ask your health care provider, or the department that is doing the test:  When will my results be ready?  How will I get my results?  What are my treatment options?  What other tests do I need?  What are my next steps? Summary  C-reactive protein (CPR) is a substance released by the liver in response to inflammation within the body.  The CRP test may be performed to help diagnose infection and inflammatory conditions.  Talk with your health care provider about what your results mean.   Uric Acid Test Why am I having this test? Uric acid is a chemical that gets released when certain substances in the body (purines) break down. Normally, uric acid is filtered out of the blood by the kidneys, and then it leaves the body through urine. If your body makes too much uric acid, or if your kidneys are not removing enough of it, uric acid can start to form crystals that build up in your joints or kidneys. Uric acid crystals in your joints can cause a type of arthritis (gout). Crystals in your urine can form kidney stones. You may have a uric acid test:  If you have joint pain or swelling that may be caused by gout.  If you frequently have kidney stones.  To help diagnose or monitor treatment of gout.  To monitor radiation or chemotherapy treatment.  To monitor kidney function or diagnose kidney disorders. What is being tested? This test measures the amount of uric acid in your blood or urine. What kind of sample is taken? This test may be done with a blood sample or a urine sample.  A  blood sample is usually collected by inserting a needle into a blood vessel. You may have a blood sample taken if: ? You are receiving treatment that increases purines in your body, such as radiation or chemotherapy. ? You have joint pain that may be caused by gout.  You may have a urine sample taken if you have kidney stones. You may be asked  to collect urine samples at home over a period of 24 hours.   How do I collect samples at home? When collecting a urine sample at home, make sure you:  Use supplies and instructions that you received from the lab.  Collect urine only in the germ-free (sterile) cup that you received from the lab.  Do not let any toilet paper or stool (feces) get into the cup.  Refrigerate the sample until you can return it to the lab.  Return the sample(s) to the lab as instructed. How do I prepare for this test? Follow instructions from your health care provider about:  Eating or drinking restrictions. You may need to stop eating and drinking everything except water starting 4 hours before the test.  Changing or stopping your regular medicines. Some medicines can affect uric acid levels. Tell a health care provider about:  Recent intense exercise. If you have recently exercised a lot, this may affect your test results.  Any allergies you have.  All medicines you are taking, including vitamins, herbs, eye drops, creams, and over-the-counter medicines.  Any blood disorders you have.  Any surgeries you have had.  Any medical conditions you have.  Whether you are pregnant or may be pregnant. How are the results reported? Your results will be reported as a value that indicates how much uric acid is in your blood or urine. Your health care provider will compare your results to normal ranges that were established after testing a large group of people (reference ranges). Reference ranges may vary among labs and hospitals. For this test, common reference ranges  are:  Blood test: ? Adult male: 4.0-8.5 mg/dL or 0.24-0.51 mmol/L. ? Adult male: 2.7-7.3 mg/dL or 0.16-0.43 mmol/L. ? Child: 2.5-5.5 mg/dL or 0.12-0.32 mmol/L. ? Newborn: 2.0-6.2 mg/dL.  Urine test: 250-750 mg/24 hr or 1.48-4.43 mmol/day (SI units). What do the results mean? Results within your reference range are considered normal, meaning that you have a normal amount of uric acid in your body. Results that are higher than your reference range may mean that:  Your body is making too much uric acid.  Your kidneys are not removing enough uric acid.  Your gout treatment plan needs to be adjusted, if applicable. You may need more tests to determine what is causing high uric acid levels. Possible causes include:  Gout.  Kidney disease.  Cancer or cancer treatment.  A diet high in purines.  Alcohol abuse.  Diabetes (diabetes mellitus). Results that are below your reference range mean that you have too little uric acid in your body. This is usually caused by certain medicines and is usually not serious. Talk with your health care provider about what your results mean. Questions to ask your health care provider Ask your health care provider, or the department that is doing the test:  When will my results be ready?  How will I get my results?  What are my treatment options?  What other tests do I need?  What are my next steps? Summary  Uric acid is a chemical that gets released when certain substances in the body (purines) break down.  If your body makes too much uric acid, or if your kidneys are not removing enough of it, uric acid can start to form crystals that build up in your joints or kidneys.  Uric acid crystals in your joints can cause a type of arthritis (gout). Crystals in your urine can form kidney stones.  This test measures the amount of  uric acid in your blood or urine.   Immunoglobulin Quantification Test Why am I having this test? The  immunoglobulin (Ig) quantification test is used to detect and monitor various diseases, including infections, chronic liver disease, some cancers, autoimmune diseases, and acquired immunodeficiency syndrome (AIDS). What is being tested? This test checks for the concentration of immune system proteins (antibodies) called immunoglobulins in the blood. They include IgG, IgM, IgA, IgD, and IgE. Immunoglobulin levels may increase for a number of reasons, including the presence of certain cancers. In these types of cancer, the cells that produce immunoglobulins (plasma cells) divide rapidly and release more immunoglobulins. Decreased immunoglobulin levels are often found in people with a deficiency in their immune system that could be due to a disease or treatment for a disease. What kind of sample is taken? A blood sample is required for this test. It is usually collected by inserting a needle into a blood vessel.   How are the results reported? Your test results will be reported as values. Your health care provider will compare your results to normal ranges that were established after testing a large group of people (reference ranges). Reference ranges may vary among labs and hospitals. For this test, common reference ranges are:  Immunoglobulin G (IgG). ? Adults: 737-1,062 mg/dL. ? Children: 250-1,600 mg/dL.  Immunoglobulin A (IgA). ? Adults: 85-385 mg/dL. ? Children: 1-350 mg/dL.  Immunoglobulin M (IgM). ? Adults: 55-375 mg/dL. ? Children: 20-200 mg/dL.  Immunoglobulin D (IgD) and Immunoglobulin E (IgE). ? Minimal. What do the results mean?  Levels of IgG that are higher than the reference range may indicate: ? Different infections. ? Autoimmune diseases. ? Chronic liver disease.  Levels of IgG that are lower than the reference range may be associated with: ? AIDS. ? Different types of cancer. ? Various causes of suppressed immunity, including medications or treatments for diseases such  as cancer.  Levels of IgA that are higher than the reference range may indicate: ? Chronic liver diseases. ? Chronic infections.  Levels of IgA that are lower than the reference range may be associated with: ? Various causes of immunoglobulin deficiency, including medications or treatments for diseases such as cancer.  Levels of IgM that are higher than the reference range may indicate: ? Certain rare cancers. ? Different infections. ? Autoimmune diseases. ? Chronic liver conditions.  Levels of IgM that are lower than the reference range may be associated with: ? AIDS. ? Various causes of immunoglobulin deficiency. ? Various causes of suppressed immunity, including medications or treatments for diseases such as cancer.  Levels of IgE that are higher than the reference range may indicate: ? Allergic reactions or allergic infections.  Levels of IgE that are lower than the reference range may indicate: ? Inherited immunoglobulin deficiency. Talk with your health care provider about what your results mean. Questions to ask your health care provider Ask your health care provider, or the department that is doing the test:  When will my results be ready?  How will I get my results?  What are my treatment options?  What other tests do I need?  What are my next steps? Summary  The immunoglobulin quantification test is performed to detect and monitor various diseases.  Immunoglobulins (Ig) are a type of antibody in the blood. They include IgG, IgM, IgA, IgD, and IgE.  The levels of these antibodies may increase due to a number of conditions, such as in certain cancers.  The levels of these antibodies may decrease  because of a problem in the immune system.  Talk with your health care provider about what your results may mean. This information is not intended to replace advice given to you by your health care provider. Make sure you discuss any questions you have with your health  care provider. Document Revised: 12/25/2019 Document Reviewed: 12/25/2019 Elsevier Patient Education  [place].

## 2020-10-07 LAB — SEDIMENTATION RATE: Sed Rate: 9 mm/h (ref 0–20)

## 2020-10-07 LAB — IGG, IGA, IGM
IgG (Immunoglobin G), Serum: 956 mg/dL (ref 600–1640)
IgM, Serum: 98 mg/dL (ref 50–300)
Immunoglobulin A: 158 mg/dL (ref 47–310)

## 2020-10-07 LAB — GLIADIN ANTIBODIES, SERUM
Gliadin IgA: <1 U/mL
Gliadin IgG: <1 U/mL

## 2020-10-07 LAB — TISSUE TRANSGLUTAMINASE, IGA: (tTG) Ab, IgA: <1 U/mL

## 2020-10-07 LAB — URIC ACID: Uric Acid, Serum: 9.2 mg/dL — ABNORMAL HIGH (ref 4.0–8.0)

## 2020-10-07 LAB — C-REACTIVE PROTEIN: CRP: 2.1 mg/L (ref <8.0)

## 2020-10-07 LAB — HLA-B27 ANTIGEN: HLA-B27 Antigen: NEGATIVE

## 2020-10-11 ENCOUNTER — Other Ambulatory Visit: Payer: Self-pay | Admitting: Adult Health

## 2020-10-11 ENCOUNTER — Encounter: Payer: Self-pay | Admitting: Adult Health

## 2020-10-11 NOTE — Telephone Encounter (Signed)
FYI

## 2020-10-26 ENCOUNTER — Ambulatory Visit (INDEPENDENT_AMBULATORY_CARE_PROVIDER_SITE_OTHER): Payer: 59 | Admitting: Internal Medicine

## 2020-10-26 ENCOUNTER — Other Ambulatory Visit: Payer: Self-pay

## 2020-10-26 ENCOUNTER — Encounter: Payer: Self-pay | Admitting: Internal Medicine

## 2020-10-26 VITALS — BP 133/76 | HR 71 | Ht 71.0 in | Wt 199.8 lb

## 2020-10-26 DIAGNOSIS — R21 Rash and other nonspecific skin eruption: Secondary | ICD-10-CM

## 2020-10-26 DIAGNOSIS — M1A0710 Idiopathic chronic gout, right ankle and foot, without tophus (tophi): Secondary | ICD-10-CM | POA: Diagnosis not present

## 2020-10-26 DIAGNOSIS — L4050 Arthropathic psoriasis, unspecified: Secondary | ICD-10-CM

## 2020-10-26 MED ORDER — FOLIC ACID 1 MG PO TABS: 1.0000 mg | ORAL_TABLET | Freq: Every day | ORAL | 0 refills | Status: DC

## 2020-10-26 MED ORDER — METHOTREXATE 2.5 MG PO TABS: 15.0000 mg | ORAL_TABLET | ORAL | 0 refills | Status: DC

## 2020-10-26 NOTE — Progress Notes (Signed)
Office Visit Note  Patient: Raymond Long             Date of Birth: 11/26/1960           MRN: [id number]             PCP: Dorothyann Peng, NP Referring: Dorothyann Peng, NP Visit Date: 10/26/2020   Subjective:  Follow-up (Patient feels as if symptoms have not changed much other than some improvement with rash since being on gluten-free diet X 4 months.)   History of Present Illness: Raymond Long is a 60 y.o. male here for follow up for his chronic joint pain of multiple sites and skin rashes.  He has continued on the gluten-free diet and thinks there is definitely some improvement with the skin disease while doing this.  Laboratory testing at his initial visit showed a uric acid of 9.2 but was otherwise negative for acute inflammatory markers or rheumatoid arthritis serology or HLA-B27 allele.  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   Review of Systems  Constitutional:  Negative for fatigue.  HENT:  Negative for mouth sores, mouth dryness and nose dryness.   Eyes:  Positive for itching. Negative for pain, visual disturbance and dryness.  Respiratory:  Negative for cough, hemoptysis, shortness of breath and difficulty breathing.   Cardiovascular:  Positive for chest pain and palpitations. Negative for swelling in legs/feet.  Gastrointestinal:  Positive for diarrhea. Negative for abdominal pain, blood in stool and constipation.  Endocrine: Negative for increased urination.  Genitourinary:  Negative for painful urination.  Musculoskeletal:  Positive for joint pain, joint pain, joint swelling, myalgias, muscle weakness, morning stiffness and myalgias. Negative for muscle tenderness.  Skin:  Positive for rash. Negative for color change and redness.  Allergic/Immunologic: Negative for susceptible to infections.  Neurological:  Positive for headaches.  Negative for dizziness, numbness, memory loss and weakness.  Hematological:  Negative for swollen glands.  Psychiatric/Behavioral:  Negative for confusion and sleep disturbance.    PMFS History:  Patient Active Problem List   Diagnosis Date Noted   Psoriatic arthritis ([place]) 10/26/2020   Rash and other nonspecific skin eruption 10/06/2020   History of revision of total replacement of left hip joint 01/05/2019   Insomnia 05/29/2017   OSA (obstructive sleep apnea) 05/29/2017   Gastroesophageal reflux with apnea 05/29/2017   Generalized headache 01/25/2017   Testicular pain, left 04/26/2016   Routine general medical examination at a health care facility 10/12/2015   Essential hypertension 09/19/2015   Malignant melanoma of skin of left thigh ([place]) 04/06/2013   Skin cancer, basal cell 10/28/2012   Fasting hyperglycemia 10/28/2012   Other abnormal glucose 10/23/2012   GOUT 10/11/2008   ERECTILE DYSFUNCTION 06/15/2008   Pain in joint 06/15/2008   Hyperlipidemia 04/23/2008   CARPAL TUNNEL SYNDROME 04/23/2008   INGUINAL PAIN, LEFT 11/20/2006    Past Medical History:  Diagnosis Date   Arthralgia of wrist, left    Cancer (HCC)    Skin   Erectile dysfunction    Gout    Foot   Hyperlipidemia    Hypertension    Insomnia    PONV (postoperative nausea and vomiting)     Family History  Problem Relation Age of Onset   Heart attack Mother        < 67   Dementia Mother    Heart attack Father  77   COPD Sister    Breast cancer Sister    Past Surgical History:  Procedure Laterality Date   COLONOSCOPY     @ 10 due to rectal symptoms   HERNIA REPAIR  2008   Bilateral   MELANOMA EXCISION  12/13/2015   Located behind right ear   MELANOMA EXCISION WITH SENTINEL LYMPH NODE BIOPSY Left 01/15/2013   Procedure: EXCISION LEFT LEG MELANOMA WITH LEFT INGUINAL SENTINEL NODE BIOPSY;  Surgeon: Rolm Bookbinder, MD;  Location: [facility];  Service: General;  Laterality: Left;    TONSILLECTOMY  age 74   TOTAL HIP ARTHROPLASTY     Social History   Social History Narrative   He works in Product manager   No kids       Likes to be outside and hike.          Immunization History  Administered Date(s) Administered   Influenza,inj,Quad PF,6+ Mos 01/25/2017, 04/09/2019   Moderna Sars-Covid-2 Vaccination 07/21/2019, 08/18/2019   Tdap 04/09/2019     Objective: Vital Signs: BP 133/76 (BP Location: Left Arm, Patient Position: Sitting, Cuff Size: Normal)   Pulse 71   Ht _0  (1.803 m)   Wt 199 lb 12.8 oz (90.6 kg)   BMI 27.87 kg/m    Physical Exam Skin:    General: Skin is warm and dry.  Neurological:     Mental Status: He is alert.     Musculoskeletal Exam:  Wrists full ROM no tenderness or swelling Fingers full ROM no tenderness or swelling, flexor tendon nodules present proximal to 4th digit on volar surface b/l MTPs 1st MTP bony enlargement severely reduced flexion and extension ROM, R worse than L, slightly cocked up deformity of 2-3th digits  Limited exam with no symptom changes since 2 weeks prior  Investigation: No additional findings.  Imaging: XR Foot 2 Views Left  Result Date: 10/07/2020 X-ray left foot 2 views Tibiotalar joint space appears normal.  Calcaneus appears normal.  Midfoot joints appear normal.  Advanced arthritis of the first MTP joint with severe narrowing and bony deposition especially medially possible small erosion versus surface irregularity.  Other MTP joints appear normal. Impression Advanced first MTP joint arthritis otherwise normal joint appearance  XR Foot 2 Views Right  Result Date: 10/07/2020 X-ray right foot 2 views Tibiotalar joint space appears normal.  Some probable posterior calcaneal enthesophyte formation.  Mild midfoot dorsal and lateral degenerative joint changes.  Extensive first MTP joint arthritis with joint narrowing with sclerosis and lateral osteophyte formation also extensive  periosteal deposition around head of first metatarsal with some punched out erosive changes.  Other MTP joints appear normal. Impression Mild OA except for extensive first MTP changes consistent with history of previous gouty arthritis  XR Hand 2 View Left  Result Date: 10/07/2020 X-ray left hand 2 views Radiocarpal joint space appears normal.  Large probable cystic change in distal ulna lunate and carpal bones.  Mild first CMC degenerative change.  MCP joint spaces appear normal.  Mild PIP and DIP joint changes no large osteophytes and no erosive changes are seen.  No periosteal reaction is present.  Bone mineralization appears normal. Impression No significant inflammatory joint changes are seen and mild degenerative changes  XR Hand 2 View Right  Result Date: 10/07/2020 X-ray right hand 2 views Radiocarpal joint space appears normal.  Carpal bones look intact there is moderate first CMC joint degenerative arthritis change.  MCP joint spaces intact with  some osteophyte formation and probable small hook osteophyte of third metacarpal head with cystic change in metacarpal heads 2 and 3.  Some joint narrowing and cortical irregularity at DIP joints without any obvious erosion.  No periosteal reaction is seen and bone mineralization appears normal. Impression Degenerative arthritic changes in first [place] joint MCP joints and DIP joints look consistent with osteoarthritis but distribution may be suggestive of secondary causes   Recent Labs: Lab Results  Component Value Date   WBC 4.4 09/08/2020   HGB 15.2 09/08/2020   PLT 152.0 09/08/2020   NA 139 09/08/2020   K 4.5 09/08/2020   CL 103 09/08/2020   CO2 28 09/08/2020   GLUCOSE 95 09/08/2020   BUN 20 09/08/2020   CREATININE 1.28 09/08/2020   BILITOT 0.6 09/08/2020   ALKPHOS 80 09/08/2020   AST 16 09/08/2020   ALT 20 09/08/2020   PROT 6.9 09/08/2020   ALBUMIN 4.5 09/08/2020   CALCIUM 9.2 09/08/2020   GFRAA 82 (L) 01/12/2013    Speciality  Comments: No specialty comments available.  Procedures:  No procedures performed Allergies: Allopurinol, Atorvastatin, Citalopram, Ezetimibe-simvastatin, Fluoxetine hcl, Rosuvastatin, Venlafaxine, Seroquel [quetiapine fumarate], and Trazodone and nefazodone   Assessment / Plan:     Visit Diagnoses: Psoriatic arthritis ([place]) - Plan: methotrexate (RHEUMATREX) 2.5 MG tablet, folic acid (FOLVITE) 1 MG tablet, CBC with Differential/Platelet, COMPLETE METABOLIC PANEL WITH GFR  Suspected diagnosis of psoriatic arthritis with diffuse joint involvement and skin disease though cannot confirm by any specific serologic findings at this time.  I recommended we consider methotrexate treatment purely as a therapeutic trial.  He had extremely high response to low-dose prednisone previously suggestive for inflammatory disease.  Discussed risks and benefits.  Reviewed of normal blood count and liver function with no history of liver disease.  Previous hepatitis C lab 2017 reviewed.  Chest x-ray 2016 with no airspace disease.  Start methotrexate 15 mg p.o. weekly and take folic acid 1 mg daily.  Discussed he will need repeat labs in 4 weeks we will have clinic follow-up in 3 months to assess clinical response or he can contact us sooner if needed.  Rash and other nonspecific skin eruption  Multiple skin rashes with history of cancerous lesions particularly left thigh melanoma not clear whether any psoriasis.  Discussed methotrexate typically beneficial for psoriatic and in some cases atopic dermatitis lesions.  Chronic gout of right ankle, unspecified cause  Chronic gouty arthritis with appearance of erosive changes on review of foot x-rays his uric acid is 9.2 that could be suggestive for this but his described symptoms are not at all typical for gout.  He does not have frankly inflammatory joint changes nor episodic symptom attacks.  We could discuss going forwards whether urate lowering therapy could be worth  pursuing again or not.  10/2015 HCV neg  Orders: Orders Placed This Encounter  Procedures   CBC with Differential/Platelet   COMPLETE METABOLIC PANEL WITH GFR    Meds ordered this encounter  Medications   methotrexate (RHEUMATREX) 2.5 MG tablet    Sig: Take 6 tablets (15 mg total) by mouth once a week. Caution:Chemotherapy. Protect from light.    Dispense:  30 tablet    Refill:  0   folic acid (FOLVITE) 1 MG tablet    Sig: Take 1 tablet (1 mg total) by mouth daily.    Dispense:  90 tablet    Refill:  0      Follow-Up Instructions: Return in about 3 months (  around 01/26/2021) for Presumptive PsA MTX start f/u 15mo.   CCollier Salina MD  Note - This record has been created using [employer]  Chart creation errors have been sought, but may not always  have been located. Such creation errors do not reflect on  the standard of medical care.

## 2020-10-26 NOTE — Patient Instructions (Signed)
Methotrexate Tablets What is this medication? METHOTREXATE (METH oh TREX ate) treats inflammatory conditions such as arthritis and psoriasis. It works by decreasing inflammation, which can reduce pain and prevent long-term injury to the joints and skin. It may also be used to treat some types of cancer. It works by slowing down the growth of cancercells. This medicine may be used for other purposes; ask your health care provider orpharmacist if you have questions. COMMON BRAND NAME(S): Rheumatrex, Trexall What should I tell my care team before I take this medication? They need to know if you have any of these conditions: Fluid in the stomach area or lungs If you often drink alcohol Infection or immune system problems Kidney disease or on hemodialysis Liver disease Low blood counts, like low white cell, platelet, or red cell counts Lung disease Radiation therapy Stomach ulcers Ulcerative colitis An unusual or allergic reaction to methotrexate, other medications, foods, dyes, or preservatives Pregnant or trying to get pregnant Breast-feeding How should I use this medication? Take this medication by mouth with a glass of water. Follow the directions on the prescription label. Take your medication at regular intervals. Do not take it more often than directed. Do not stop taking except on your care team'sadvice. Make sure you know why you are taking this medication and how often you should take it. If this medication is used for a condition that is not cancer, like arthritis or psoriasis, it should be taken weekly, NOT daily. Taking thismedication more often than directed can cause serious side effects, even death. Talk to your care team about safe handling and disposal of this medication. Youmay need to take special precautions. Talk to your care team about the use of this medication in children. While thismedication may be prescribed for selected conditions, precautions do apply. Overdosage:  If you think you have taken too much of this medicine contact apoison control center or emergency room at once. NOTE: This medicine is only for you. Do not share this medicine with others. What if I miss a dose? If you miss a dose, talk with your care team. Do not take double or extra doses. What may interact with this medication? Do not take this medication with any of the following: Acitretin This medication may also interact with the following: Aspirin and aspirin-like medications including salicylates Azathioprine Certain antibiotics like penicillins, tetracycline, and chloramphenicol Certain medications that treat or prevent blood clots like warfarin, apixaban, dabigatran, and rivaroxaban Certain medications for stomach problems like esomeprazole, omeprazole, pantoprazole Cyclosporine Dapsone Diuretics Gold Hydroxychloroquine Live virus vaccines Medications for infection like acyclovir, adefovir, amphotericin B, bacitracin, cidofovir, foscarnet, ganciclovir, gentamicin, pentamidine, vancomycin Mercaptopurine NSAIDs, medications for pain and inflammation, like ibuprofen or naproxen Other cytotoxic agents Pamidronate Pemetrexed Penicillamine Phenylbutazone Phenytoin Probenecid Pyrimethamine Retinoids such as isotretinoin and tretinoin Steroid medications like prednisone or cortisone Sulfonamides like sulfasalazine and trimethoprim/sulfamethoxazole Theophylline Zoledronic acid This list may not describe all possible interactions. Give your health care provider a list of all the medicines, herbs, non-prescription drugs, or dietary supplements you use. Also tell them if you smoke, drink alcohol, or use illegaldrugs. Some items may interact with your medicine. What should I watch for while using this medication? Avoid alcoholic drinks. This medication can make you more sensitive to the sun. Keep out of the sun. If you cannot avoid being in the sun, wear protective clothing and  use sunscreen.Do not use sun lamps or tanning beds/booths. You may need blood work done while you are taking this medication.  Call your care team for advice if you get a fever, chills or sore throat, or other symptoms of a cold or flu. Do not treat yourself. This medication decreases your body's ability to fight infections. Try to avoid being aroundpeople who are sick. This medication may increase your risk to bruise or bleed. Call your care teamif you notice any unusual bleeding. Be careful brushing or flossing your teeth or using a toothpick because you may get an infection or bleed more easily. If you have any dental work done, Primary school teacher you are receiving this medication. Check with your care team if you get an attack of severe diarrhea, nausea and vomiting, or if you sweat a lot. The loss of too much body fluid can make itdangerous for you to take this medication. Talk to your care team about your risk of cancer. You may be more at risk forcertain types of cancers if you take this medication. Do not become pregnant while taking this medication or for 6 months after stopping it. Women should inform their care team if they wish to become pregnant or think they might be pregnant. Men should not father a child while taking this medication and for 3 months after stopping it. There is potential for serious harm to an unborn child. Talk to your care team for more information. Do not breast-feed an infant while taking this medication or for 1week after stopping it. This medication may make it more difficult to get pregnant or father a child.Talk to your care team if you are concerned about your fertility. What side effects may I notice from receiving this medication? Side effects that you should report to your care team as soon as possible: Allergic reactions-skin rash, itching, hives, swelling of the face, lips, tongue, or throat Blood clot-pain, swelling, or warmth in the leg, shortness of breath,  chest pain Dry cough, shortness of breath or trouble breathing Infection-fever, chills, cough, sore throat, wounds that don't heal, pain or trouble when passing urine, general feeling of discomfort or being unwell Kidney injury-decrease in the amount of urine, swelling of the ankles, hands, or feet Liver injury-right upper belly pain, loss of appetite, nausea, light-colored stool, dark yellow or brown urine, yellowing of the skin or eyes, unusual weakness or fatigue Low red blood cell count-unusual weakness or fatigue, dizziness, headache, trouble breathing Redness, blistering, peeling, or loosening of the skin, including inside the mouth Seizures Unusual bruising or bleeding Side effects that usually do not require medical attention (report to your careteam if they continue or are bothersome): Diarrhea Dizziness Hair loss Nausea Pain, redness, or swelling with sores inside the mouth or throat Vomiting This list may not describe all possible side effects. Call your doctor for medical advice about side effects. You may report side effects to FDA at1-800-FDA-1088. Where should I keep my medication? Keep out of the reach of children and pets. Store at room temperature between 20 and 25 degrees C (68 and 77 degrees F).Protect from light. Get rid of any unused medication after the expiration date. Talk to your care team about how to dispose of unused medication. Specialdirections may apply. NOTE: This sheet is a summary. It may not cover all possible information. If you have questions about this medicine, talk to your doctor, pharmacist, orhealth care provider.  2022 Elsevier/Gold Standard (2020-05-30 15:55:45)

## 2020-10-31 ENCOUNTER — Encounter: Payer: Self-pay | Admitting: Internal Medicine

## 2020-11-02 ENCOUNTER — Ambulatory Visit: Payer: 59 | Admitting: Neurology

## 2020-11-02 MED ORDER — PREDNISONE 5 MG PO TABS: 5.0000 mg | ORAL_TABLET | Freq: Every day | ORAL | 0 refills | Status: DC | PRN

## 2020-11-02 NOTE — Telephone Encounter (Signed)
FYI- I spoke with Raymond Long on 6/27 plan is to continue current medications, if flares continue more frequently than before will stop the newly started methotrexate and folic acid.

## 2020-11-20 ENCOUNTER — Other Ambulatory Visit: Payer: Self-pay | Admitting: Internal Medicine

## 2020-11-20 DIAGNOSIS — L4050 Arthropathic psoriasis, unspecified: Secondary | ICD-10-CM

## 2020-11-21 ENCOUNTER — Other Ambulatory Visit: Payer: Self-pay

## 2020-11-21 DIAGNOSIS — L4050 Arthropathic psoriasis, unspecified: Secondary | ICD-10-CM

## 2020-11-21 LAB — COMPLETE METABOLIC PANEL WITH GFR
AG Ratio: 1.8 (calc) (ref 1.0–2.5)
ALT: 34 U/L (ref 9–46)
AST: 24 U/L (ref 10–35)
Albumin: 4.4 g/dL (ref 3.6–5.1)
Alkaline phosphatase (APISO): 64 U/L (ref 35–144)
BUN: 20 mg/dL (ref 7–25)
CO2: 29 mmol/L (ref 20–32)
Calcium: 9.4 mg/dL (ref 8.6–10.3)
Chloride: 103 mmol/L (ref 98–110)
Creat: 1.24 mg/dL (ref 0.70–1.35)
Globulin: 2.4 g/dL (calc) (ref 1.9–3.7)
Glucose, Bld: 89 mg/dL (ref 65–99)
Potassium: 4.1 mmol/L (ref 3.5–5.3)
Sodium: 140 mmol/L (ref 135–146)
Total Bilirubin: 0.6 mg/dL (ref 0.2–1.2)
Total Protein: 6.8 g/dL (ref 6.1–8.1)
eGFR: 67 mL/min/{1.73_m2} (ref >=60)

## 2020-11-21 LAB — CBC WITH DIFFERENTIAL/PLATELET
Absolute Monocytes: 429 cells/uL (ref 200–950)
Basophils Absolute: 21 cells/uL (ref 0–200)
Basophils Relative: 0.4 %
Eosinophils Absolute: 90 cells/uL (ref 15–500)
Eosinophils Relative: 1.7 %
HCT: 43.1 % (ref 38.5–50.0)
Hemoglobin: 14.7 g/dL (ref 13.2–17.1)
Lymphs Abs: 1696 cells/uL (ref 850–3900)
MCH: 31.3 pg (ref 27.0–33.0)
MCHC: 34.1 g/dL (ref 32.0–36.0)
MCV: 91.7 fL (ref 80.0–100.0)
MPV: 11.1 fL (ref 7.5–12.5)
Monocytes Relative: 8.1 %
Neutro Abs: 3063 cells/uL (ref 1500–7800)
Neutrophils Relative %: 57.8 %
Platelets: 169 10*3/uL (ref 140–400)
RBC: 4.7 10*6/uL (ref 4.20–5.80)
RDW: 13.7 % (ref 11.0–15.0)
Total Lymphocyte: 32 %
WBC: 5.3 10*3/uL (ref 3.8–10.8)

## 2020-11-21 NOTE — Telephone Encounter (Signed)
Next Visit: 01/26/2021 Last Visit: 10/26/2020  Last Fill: 10/26/2020  DX: Psoriatic arthritis  Current Dose per office note 10/26/2020: methotrexate 15 mg p.o. weekly  Labs: Due for CBC, CMP 10/26/2020 OV note: will need repeat labs in 4 weeks- spoke with patient, he plans to come this afternoon to have labs drawn.   Okay to refill MTX?

## 2020-11-22 NOTE — Progress Notes (Signed)
Blood count and liver and kidney function look fine no problems with the methotrexate use.

## 2020-11-30 ENCOUNTER — Ambulatory Visit (INDEPENDENT_AMBULATORY_CARE_PROVIDER_SITE_OTHER): Payer: 59 | Admitting: Internal Medicine

## 2020-11-30 ENCOUNTER — Encounter (HOSPITAL_BASED_OUTPATIENT_CLINIC_OR_DEPARTMENT_OTHER): Payer: Self-pay | Admitting: Internal Medicine

## 2020-11-30 ENCOUNTER — Other Ambulatory Visit: Payer: Self-pay

## 2020-11-30 VITALS — BP 132/78 | HR 60 | Ht 71.0 in | Wt 193.2 lb

## 2020-11-30 DIAGNOSIS — L4050 Arthropathic psoriasis, unspecified: Secondary | ICD-10-CM

## 2020-11-30 DIAGNOSIS — Z8249 Family history of ischemic heart disease and other diseases of the circulatory system: Secondary | ICD-10-CM

## 2020-11-30 DIAGNOSIS — M609 Myositis, unspecified: Secondary | ICD-10-CM

## 2020-11-30 DIAGNOSIS — T466X5A Adverse effect of antihyperlipidemic and antiarteriosclerotic drugs, initial encounter: Secondary | ICD-10-CM

## 2020-11-30 DIAGNOSIS — E7849 Other hyperlipidemia: Secondary | ICD-10-CM | POA: Diagnosis not present

## 2020-11-30 NOTE — Patient Instructions (Signed)
Medication Instructions:  Dr. Debara Pickett recommends Praluent '150mg'$ /mL (PCSK9). This is an injectable cholesterol medication self-administered once every 14 days. This medication will likely need prior approval with your insurance company, which we will work on. If the medication is not approved initially, we may need to do an appeal with your insurance.   Administer medication in area of fatty tissue such as abdomen, outer thigh, back of upper arm - and rotate site with each injection Store medication in refrigerator until ready to administer - allow to sit at room temp for 30 mins - 1 hour prior to injection Dispose of medication in a SHARPS container - your pharmacy should be able to direct you on this and proper disposal   If you need a co-pay card for Repatha: [email or web address]-[email or web address] >> paying for Repatha or red box that says "[place]" in top right If you need a co-pay card for Praluent: WedMap.it >> starting & paying for Praluent  Patient Assistance:   The PAN Foundation: https://www.panfoundation.org/disease-funds/hypercholesterolemia/ -- can sign up for wait list  The [employer] foundation offers assistance to help pay for medication copays.  They will cover copays for all cholesterol lowering meds, including statins, fibrates, omega-3 fish oils like Vascepa, ezetimibe, Repatha, Praluent, Nexletol, Nexlizet.  The cards are usually good for $2,500 or 12 months, whichever comes first. Go to healthwellfoundation.org Click on "Apply Now" Answer questions as to whom is applying (patient or representative) Your disease fund will be "hypercholesterolemia - Medicare access" They will ask questions about finances and which medications you are taking for cholesterol When you submit, the approval is usually within minutes.  You will need to print the card information from the site You will need to show this information to your pharmacy, they will bill your Medicare Part D plan first -then bill [employer] --for the copay.   You can also call them at [phone], although the hold times can be quite long.     *If you need a refill on your cardiac medications before your next appointment, please call your pharmacy*   Lab Work: NON-FASTING lab work after 2 weeks on PCSK9i - liver function panel, CK  FASTING lab work after 3-4 months on PCSK9i - NMR lipoprofile, LP(a)  If you have labs (blood work) drawn today and your tests are completely normal, you will receive your results only by: MyChart Message (if you have MyChart) OR A paper copy in the mail If you have any lab test that is abnormal or we need to change your treatment, we will call you to review the results.   Testing/Procedures: NONE   Follow-Up: At [facility], you and your health needs are our priority.  As part of our continuing mission to provide you with exceptional heart care, we have created designated Provider Care Teams.  These Care Teams include your primary Cardiologist (physician) and Advanced Practice Providers (APPs -  Physician Assistants and Nurse Practitioners) who all work together to provide you with the care you need, when you need it.  We recommend signing up for the patient portal called "MyChart".  Sign up information is provided on this After Visit Summary.  MyChart is used to connect with patients for Virtual Visits (Telemedicine).  Patients are able to view lab/test results, encounter notes, upcoming appointments, etc.  Non-urgent messages can be sent to your provider as well.   To learn more about what you can do with MyChart, go to [email or web address].    Your next appointment:  3-4 months - lipid clinic  The format for your next appointment:   In Person or Virtual  Provider:   K. Mali Hilty, MD   Other Instructions

## 2020-11-30 NOTE — Progress Notes (Signed)
LIPID CLINIC CONSULT NOTE  Chief Complaint:  Manage dyslipidemia  Primary Care Physician: Raymond Peng, NP  Primary Cardiologist:  None  HPI:  Raymond Long is a 60 y.o. male who is being seen today for the evaluation of dyslipidemia at the request of Raymond Peng, NP.  This is a pleasant 60 year old male kindly referred for evaluation management of dyslipidemia.  He reports longstanding issues with cholesterol that has been difficult to control due to side effects with the statins.  He has previously tried atorvastatin, rosuvastatin and Vytorin, all of which caused myalgias and increase in his CKs.  He does report family history of heart disease in both parents in their 60s.  His untreated cholesterol has been well over 190 and is up to the 200s in the past.  More recently his total cholesterol was 256, triglycerides 147, HDL 28 and LDL 199.  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  He also has a history of psoriatic arthritis on treatment and this is highly significant for an at risk factor for heart disease, typically conferring up to 7 times the risk of heart disease compared to an on psoriatic arthritis patient.  PMHx:  Past Medical History:  Diagnosis Date   Arthralgia of wrist, left    Cancer (HCC)    Skin   Erectile dysfunction    Gout    Foot   Hyperlipidemia    Hypertension    Insomnia    PONV (postoperative nausea and vomiting)     Past Surgical History:  Procedure Laterality Date   COLONOSCOPY     @ 49 due to rectal symptoms   HERNIA REPAIR  2008   Bilateral   MELANOMA EXCISION  12/13/2015   Located behind right ear   MELANOMA EXCISION WITH SENTINEL LYMPH NODE BIOPSY Left 01/15/2013   Procedure: EXCISION LEFT LEG MELANOMA WITH LEFT INGUINAL SENTINEL NODE BIOPSY;  Surgeon: Rolm Bookbinder, MD;  Location: [facility];  Service: General;  Laterality: Left;   TONSILLECTOMY  age 56   TOTAL HIP ARTHROPLASTY      FAMHx:   Family History  Problem Relation Age of Onset   Heart attack Mother        < 85   Dementia Mother    Heart attack Father 52   COPD Sister    Breast cancer Sister     SOCHx:   reports that he quit smoking about 19 years ago. His smoking use included cigarettes. He has a 40.00 pack-year smoking history. He has never used smokeless tobacco. He reports that he does not drink alcohol and does not use drugs.  ALLERGIES:  Allergies  Allergen Reactions   Allopurinol     Itching, redness, scaling to his skin and swelling in the ankle   Atorvastatin    Citalopram    Ezetimibe-Simvastatin     REACTION: up cpk   Fluoxetine Hcl    Rosuvastatin     REACTION: up cpk   Venlafaxine    Seroquel [Quetiapine Fumarate] Palpitations    Racing thoughts and restless sleep   Trazodone And Nefazodone Palpitations    ROS: Pertinent items noted in HPI and remainder of comprehensive ROS otherwise negative.  HOME MEDS: Current Outpatient Medications on File Prior to Visit  Medication Sig Dispense Refill   amLODipine (NORVASC) 2.5 MG tablet Take 1 tablet (2.5 mg total) by mouth daily. 90 tablet 3   folic acid (FOLVITE) 1 MG tablet Take 1 tablet (1 mg  total) by mouth daily. 90 tablet 0   hydrOXYzine (ATARAX/VISTARIL) 25 MG tablet Take 25 mg by mouth at bedtime.     imiquimod (ALDARA) 5 % cream SMARTSIG:1 Topical Daily PRN     methotrexate 2.5 MG tablet TAKE 6 TABLETS BY MOUTH ONCE WEEKLY 30 tablet 1   mirtazapine (REMERON) 30 MG tablet Take 1 tablet (30 mg total) by mouth at bedtime. 90 tablet 1   OVER THE COUNTER MEDICATION Nicatinamide 500 mg po qhs     predniSONE (DELTASONE) 5 MG tablet Take 1 tablet (5 mg total) by mouth daily as needed (for flare up). 30 tablet 0   No current facility-administered medications on file prior to visit.    LABS/IMAGING: No results found for this or any previous visit (from the past 48 hour(s)). No results found.  LIPID PANEL:    Component Value Date/Time    CHOL 256 (H) 09/08/2020 0718   CHOL 251 (H) 11/02/2014 1026   TRIG 147.0 09/08/2020 0718   TRIG 186 (H) 11/02/2014 1026   TRIG 89 04/23/2006 0959   HDL 28.00 (L) 09/08/2020 0718   HDL 29 (L) 11/02/2014 1026   CHOLHDL 9 09/08/2020 0718   VLDL 29.4 09/08/2020 0718   LDLCALC 198 (H) 09/08/2020 0718   LDLCALC 185 (H) 11/02/2014 1026   LDLDIRECT 176.9 06/09/2008 0958    WEIGHTS: Wt Readings from Last 3 Encounters:  11/30/20 193 lb 3.2 oz (87.6 kg)  10/26/20 199 lb 12.8 oz (90.6 kg)  10/06/20 199 lb (90.3 kg)    VITALS: BP 132/78   Pulse 60   Ht '5\' 11"'$  (1.803 m)   Wt 193 lb 3.2 oz (87.6 kg)   SpO2 97%   BMI 26.95 kg/m   EXAM: General appearance: alert and no distress Neck: no carotid bruit, no JVD, and thyroid not enlarged, symmetric, no tenderness/mass/nodules Lungs: clear to auscultation bilaterally Heart: regular rate and rhythm, S1, S2 normal, no murmur, click, rub or gallop Abdomen: soft, non-tender; bowel sounds normal; no masses,  no organomegaly Extremities: extremities normal, atraumatic, no cyanosis or edema Pulses: 2+ and symmetric Skin: Skin color, texture, turgor normal. No rashes or lesions Neurologic: Grossly normal Psych: Pleasant  EKG: N/A  ASSESSMENT: Probable familial hyperlipidemia, based on Simon Broome criteria Longstanding history of LDL cholesterol greater than 190 Premature coronary disease in both parents in their 60s Statin intolerance-myalgias/myositis Psoriatic arthritis  PLAN: 1.   [name] has probable familial hyperlipidemia based on Simon Broome criteria.  He has longstanding LDL cholesterols greater than 190 but unfortunately cannot tolerate statins due to myalgias and findings of myositis with elevated CK in the past.  This makes him a poor candidate to continue to try statins and he is already failed the high potency statins.  His psoriatic arthritis is a major risk factor for coronary disease and he needs more aggressive  lipid-lowering.  I would recommend a PCSK9 inhibitor with a plan to repeat lipids in about 3 to 4 months on therapy.  We will need to check liver enzymes and CKs in about a couple weeks after his first dose and then plan continue therapy if that stable and follow-up with me in about 3 to 4 months.  Thanks again for the kind referral.  Pixie Casino, MD, FACC, [place] Director of the [facility] Diplomate of the American Board of Clinical Lipidology Attending Cardiologist  Direct Dial: [phone]  Fax: [phone]  Website:  [email or web address] 11/30/2020, 1:35 PM

## 2020-12-06 ENCOUNTER — Encounter: Payer: Self-pay | Admitting: Internal Medicine

## 2020-12-07 MED ORDER — COLCHICINE 0.6 MG PO TABS: ORAL_TABLET | ORAL | 2 refills | Status: DC

## 2020-12-07 NOTE — Addendum Note (Signed)
Addended by: Collier Salina on: 12/07/2020 09:53 AM   Modules accepted: Orders

## 2020-12-14 ENCOUNTER — Ambulatory Visit: Payer: 59 | Admitting: Psychology

## 2020-12-14 ENCOUNTER — Encounter: Payer: Self-pay | Admitting: Adult Health

## 2020-12-15 ENCOUNTER — Encounter: Payer: Self-pay | Admitting: Family Medicine

## 2020-12-15 ENCOUNTER — Telehealth (INDEPENDENT_AMBULATORY_CARE_PROVIDER_SITE_OTHER): Payer: 59 | Admitting: Family Medicine

## 2020-12-15 VITALS — Temp 98.2°F

## 2020-12-15 DIAGNOSIS — R0789 Other chest pain: Secondary | ICD-10-CM | POA: Diagnosis not present

## 2020-12-15 DIAGNOSIS — R059 Cough, unspecified: Secondary | ICD-10-CM | POA: Diagnosis not present

## 2020-12-15 NOTE — Telephone Encounter (Signed)
Attempted to call pt no answer. Lm informing pt to schedule a VV or in person ov.

## 2020-12-15 NOTE — Patient Instructions (Signed)
Seek in person medical care today as we discussed.  If you have worsening severe or life-threatening symptoms, please call 911 and seek emergency care.  I hope you are feeling better soon!   It was nice to meet you today. I help [facility] out with telemedicine visits on Tuesdays and Thursdays and am available for visits on those days. If you have any concerns or questions following this visit please schedule a follow up visit with your Primary Care doctor or seek care at a local urgent care clinic to avoid delays in care.

## 2020-12-15 NOTE — Progress Notes (Signed)
Virtual Visit via Video Note  I connected with Raymond Long  on 12/15/20 at 11:00 AM EDT by a video enabled telemedicine application and verified that I am speaking with the correct person using two identifiers.  Location patient: home, [place] Location provider:work or home office Persons participating in the virtual visit: patient, provider  I discussed the limitations of evaluation and management by telemedicine and the availability of in person appointments. The patient expressed understanding and agreed to proceed.   HPI:  Acute telemedicine visit for a cough: -Onset: 3 days ago -has done several home covid tests which were negative, but his partner just is getting over covid - he had covid last week and isolated from patient -Symptoms include: cough, endorses chest pain and tightness, headache, coughing up white sputum - he thinks is pink form the cherry robitussin, body aches, diarrhea -Denies:fever, SOB, vomiting, hemoptysis, inability to eat/drink/get out of bed -Has tried:several over the counter cough medications -Pertinent past medical history: see below -Pertinent medication allergies: Allergies  Allergen Reactions   Allopurinol     Itching, redness, scaling to his skin and swelling in the ankle   Atorvastatin    Citalopram    Ezetimibe-Simvastatin     REACTION: up cpk   Fluoxetine Hcl    Rosuvastatin     REACTION: up cpk   Venlafaxine    Seroquel [Quetiapine Fumarate] Palpitations    Racing thoughts and restless sleep   Trazodone And Nefazodone Palpitations  -COVID-19 vaccine status: vaccinated x 2 and had one booster   ROS: See pertinent positives and negatives per HPI.  Past Medical History:  Diagnosis Date   Arthralgia of wrist, left    Cancer (HCC)    Skin   Erectile dysfunction    Gout    Foot   Hyperlipidemia    Hypertension    Insomnia    PONV (postoperative nausea and vomiting)     Past Surgical History:  Procedure Laterality Date   COLONOSCOPY      @ 49 due to rectal symptoms   HERNIA REPAIR  2008   Bilateral   MELANOMA EXCISION  12/13/2015   Located behind right ear   MELANOMA EXCISION WITH SENTINEL LYMPH NODE BIOPSY Left 01/15/2013   Procedure: EXCISION LEFT LEG MELANOMA WITH LEFT INGUINAL SENTINEL NODE BIOPSY;  Surgeon: Rolm Bookbinder, MD;  Location: [facility];  Service: General;  Laterality: Left;   TONSILLECTOMY  age 44   TOTAL HIP ARTHROPLASTY       Current Outpatient Medications:    amLODipine (NORVASC) 2.5 MG tablet, Take 1 tablet (2.5 mg total) by mouth daily., Disp: 90 tablet, Rfl: 3   colchicine 0.6 MG tablet, Take 1 tablet 1 to 2 times daily as needed for gout flares, Disp: 30 tablet, Rfl: 2   folic acid (FOLVITE) 1 MG tablet, Take 1 tablet (1 mg total) by mouth daily., Disp: 90 tablet, Rfl: 0   hydrOXYzine (ATARAX/VISTARIL) 25 MG tablet, Take 25 mg by mouth at bedtime., Disp: , Rfl:    imiquimod (ALDARA) 5 % cream, SMARTSIG:1 Topical Daily PRN, Disp: , Rfl:    methotrexate 2.5 MG tablet, TAKE 6 TABLETS BY MOUTH ONCE WEEKLY, Disp: 30 tablet, Rfl: 1   mirtazapine (REMERON) 30 MG tablet, Take 1 tablet (30 mg total) by mouth at bedtime., Disp: 90 tablet, Rfl: 1   predniSONE (DELTASONE) 5 MG tablet, Take 1 tablet (5 mg total) by mouth daily as needed (for flare up)., Disp: 30 tablet, Rfl: 0  EXAM:  VITALS per patient if applicable:  GENERAL: alert, oriented, appears well and in no acute distress  HEENT: atraumatic, conjunttiva clear, no obvious abnormalities on inspection of external nose and ears  NECK: normal movements of the head and neck  LUNGS: on inspection no signs of respiratory distress, breathing rate appears normal, no obvious gross SOB, gasping or wheezing  CV: no obvious cyanosis  MS: moves all visible extremities without noticeable abnormality  PSYCH/NEURO: pleasant and cooperative, no obvious depression or anxiety, speech and thought processing grossly intact  ASSESSMENT AND  PLAN:  Discussed the following assessment and plan:  Chest discomfort  Cough  -we discussed possible serious and likely etiologies, options for evaluation and workup, limitations of telemedicine visit vs in person visit, treatment, treatment risks and precautions. Pt prefers to treat via telemedicine empirically rather than in person at this moment. Query [phone] (though he reports partner came back from vacation and isolation so he was not around him), CAP, bronchitis vs other. Given the chest pain and 3 negative home covid tests, have advised inperson evaluation would be best. His PCP office is unavailable. Discussed other options for inperson care. He agrees to seek care today and prefers to go via self transport.    I discussed the assessment and treatment plan with the patient. The patient was provided an opportunity to ask questions and all were answered. The patient agreed with the plan and demonstrated an understanding of the instructions.     Lucretia Kern, DO

## 2020-12-19 ENCOUNTER — Encounter: Payer: Self-pay | Admitting: Internal Medicine

## 2020-12-19 NOTE — Telephone Encounter (Signed)
Please call to reschedule next f/u to be in about 2 weeks instead of next month, since he is stopping methotrexate for now.  FYI- I spoke with Raymond Long he has significant cough and hoarseness and chest pain for a week saw Urgent care on the 11th with negative chest xray and treated with cough medicine and tylenol no improvement so far. I do not believe he has any type of infection related to the methotrexate treatment. Pneumonitis is possible, he has 2 oral ulcers at this time as well but clear xray is less likely. He will interrupt treatment for now in case related. If symptoms worsen should get evaluated again might need antibiotics or steroids.

## 2020-12-22 ENCOUNTER — Encounter: Payer: Self-pay | Admitting: Internal Medicine

## 2020-12-22 ENCOUNTER — Telehealth: Payer: Self-pay

## 2020-12-22 NOTE — Telephone Encounter (Signed)
Patient left a voicemail stating he was not able to log into his MyChart this morning to send a message.  Patient states his symptoms are getting worse and requested a return call to let him know if Dr. Benjamine Mola wants to see him next week.  Patient states he is currently scheduled for 01/02/21.

## 2020-12-22 NOTE — Telephone Encounter (Signed)
Sure we can move his appointment to whenever works for him next week, the later date was scheduled anticipating ongoing methotrexate treatment.

## 2020-12-23 NOTE — Telephone Encounter (Signed)
Appointment has been rescheduled from 01/02/2021 to 12/26/2020.

## 2020-12-25 NOTE — Progress Notes (Signed)
Office Visit Note  Patient: Raymond Long             Date of Birth: 05/23/1960           MRN: [id number]             PCP: Raymond Peng, NP Referring: Raymond Peng, NP Visit Date: 12/26/2020   Subjective:  Follow-up (Patient's last dose of MTX x 9 days and patient has been experiencing side effects x 2 weeks.  )   History of Present Illness: Raymond Long is a 60 y.o. male here for follow up for seronegative arthritis and chronic gout. He started taking methotrexate after June visit was tolerating this medicine okay although experienced a gout flare that was treated with prednisone and now experiencing bad cough and hoarseness since almost 2 weeks ago. He felt his joint symptoms improved noticeably very quickly however then had a gout flare and then developed coughing, hoarseness, oral ulcers on bottom of his tongue, and weight loss with lack of appetite. He took prednisone for a short time to abort a gout flare then stopped these again. He stopped taking the methotrexate but still has continued symptoms. Cough has persisted and not improved with OTC or prescribed cough medicines including codeine.   Labs reviewed 10/26/20 Raymond Long is a 60 y.o. male here for follow up for his chronic joint pain of multiple sites and skin rashes.  He has continued on the gluten-free diet and thinks there is definitely some improvement with the skin disease while doing this.  Laboratory testing at his initial visit showed a uric acid of 9.2 but was otherwise negative for acute inflammatory markers or rheumatoid arthritis serology or HLA-B27 allele.  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   Review of Systems  Constitutional:  Positive for fatigue.  HENT:  Positive for mouth sores, mouth dryness and nose dryness.   Eyes:  Positive for pain, itching, visual  disturbance and dryness.  Respiratory:  Positive for cough, shortness of breath and difficulty breathing. Negative for hemoptysis.   Cardiovascular:  Positive for chest pain. Negative for palpitations and swelling in legs/feet.  Gastrointestinal:  Positive for constipation. Negative for abdominal pain, blood in stool and diarrhea.  Endocrine: Positive for increased urination.  Genitourinary:  Positive for difficulty urinating and painful urination.  Musculoskeletal:  Negative for joint pain, joint pain, joint swelling, myalgias, muscle weakness, morning stiffness, muscle tenderness and myalgias.  Skin:  Negative for color change, rash and redness.  Allergic/Immunologic: Negative for susceptible to infections.  Neurological:  Positive for dizziness and headaches. Negative for numbness, memory loss and weakness.  Hematological:  Negative for swollen glands.  Psychiatric/Behavioral:  Positive for sleep disturbance. Negative for confusion.    PMFS History:  Patient Active Problem List   Diagnosis Date Noted   Cough 12/26/2020   Psoriatic arthritis ([place]) 10/26/2020   Rash and other nonspecific skin eruption 10/06/2020   History of revision of total replacement of left hip joint 01/05/2019   Insomnia 05/29/2017   OSA (obstructive sleep apnea) 05/29/2017   Gastroesophageal reflux with apnea 05/29/2017   Generalized headache 01/25/2017   Testicular pain, left 04/26/2016   Routine general medical examination at a health care facility 10/12/2015   Essential hypertension 09/19/2015   Malignant melanoma of skin of left thigh ([place]) 04/06/2013   Skin cancer, basal cell 10/28/2012  Fasting hyperglycemia 10/28/2012   Other abnormal glucose 10/23/2012   GOUT 10/11/2008   ERECTILE DYSFUNCTION 06/15/2008   Pain in joint 06/15/2008   Hyperlipidemia 04/23/2008   CARPAL TUNNEL SYNDROME 04/23/2008   INGUINAL PAIN, LEFT 11/20/2006    Past Medical History:  Diagnosis Date   Arthralgia of wrist, left     Cancer (HCC)    Skin   Erectile dysfunction    Gout    Foot   Hyperlipidemia    Hypertension    Insomnia    PONV (postoperative nausea and vomiting)     Family History  Problem Relation Age of Onset   Heart attack Mother        < 48   Dementia Mother    Heart attack Father 64   COPD Sister    Breast cancer Sister    Past Surgical History:  Procedure Laterality Date   COLONOSCOPY     @ 34 due to rectal symptoms   HERNIA REPAIR  2008   Bilateral   MELANOMA EXCISION  12/13/2015   Located behind right ear   MELANOMA EXCISION WITH SENTINEL LYMPH NODE BIOPSY Left 01/15/2013   Procedure: EXCISION LEFT LEG MELANOMA WITH LEFT INGUINAL SENTINEL NODE BIOPSY;  Surgeon: Rolm Bookbinder, MD;  Location: [facility];  Service: General;  Laterality: Left;   TONSILLECTOMY  age 39   TOTAL HIP ARTHROPLASTY     Social History   Social History Narrative   He works in Product [employer]   No kids       Likes to be outside and hike.          Immunization History  Administered Date(s) Administered   Influenza,inj,Quad PF,6+ Mos 01/25/2017, 04/09/2019   Moderna Sars-Covid-2 Vaccination 07/21/2019, 08/18/2019   Tdap 04/09/2019     Objective: Vital Signs: BP (!) 152/94 (BP Location: Left Arm, Patient Position: Sitting, Cuff Size: Normal)   Pulse 68   Ht 5' 11"  (1.803 m)   Wt 192 lb 12.8 oz (87.5 kg)   BMI 26.89 kg/m    Physical Exam Constitutional:      Comments: Tired appearing  HENT:     Mouth/Throat:     Mouth: Mucous membranes are moist.     Pharynx: Oropharynx is clear.  Eyes:     Conjunctiva/sclera: Conjunctivae normal.  Cardiovascular:     Rate and Rhythm: Normal rate and regular rhythm.  Pulmonary:     Effort: Pulmonary effort is normal.     Breath sounds: Normal breath sounds.     Comments: Severely hoarse voice Mild inspiratory upper airway wheezing Skin:    General: Skin is warm and dry.     Findings: No rash.  Neurological:      Mental Status: He is alert.  Psychiatric:        Mood and Affect: Mood normal.     Musculoskeletal Exam:  Wrists full ROM no tenderness or swelling Fingers full ROM no tenderness or swelling, flexor tendon nodules present proximal to 4th digit on volar surface b/l MTPs 1st MTP bony enlargement severely reduced flexion and extension ROM, R worse than L, slightly cocked up deformity of 2-3th digits    Investigation: No additional findings.  Imaging: No results found.  Recent Labs: Lab Results  Component Value Date   WBC 5.3 11/21/2020   HGB 14.7 11/21/2020   PLT 169 11/21/2020   NA 140 11/21/2020   K 4.1 11/21/2020   CL 103 11/21/2020  CO2 29 11/21/2020   GLUCOSE 89 11/21/2020   BUN 20 11/21/2020   CREATININE 1.24 11/21/2020   BILITOT 0.6 11/21/2020   ALKPHOS 80 09/08/2020   AST 24 11/21/2020   ALT 34 11/21/2020   PROT 6.8 11/21/2020   ALBUMIN 4.5 09/08/2020   CALCIUM 9.4 11/21/2020   GFRAA 82 (L) 01/12/2013    Speciality Comments: No specialty comments available.  Procedures:  No procedures performed Allergies: Allopurinol, Atorvastatin, Citalopram, Ezetimibe-simvastatin, Fluoxetine hcl, Methotrexate derivatives, Rosuvastatin, Venlafaxine, Seroquel [quetiapine fumarate], and Trazodone and nefazodone   Assessment / Plan:     Visit Diagnoses: Psoriatic arthritis ([place])  Stopping methotrexate treatment for possible penumonitis/mucositis involvement causing current symptoms although clear lungs and no obvious evidence for this, could simply be more severe viral URI exacerbation. He did report some joint improvement before the multiple complications but would probably not rechallenge methotrexate treatment. May be candidate for trying alternative DMARD such as otezla after current symptoms improve.  Chronic gout of right ankle, unspecified cause  Not in acute exacerbation anymore and steroid treatment as recommended should also cover for this  problem.  Cough  Recommend trial of adding oral tessalon as needed due to lack of improvement with guaifenesin and codeine so far. Hopefully will improve with steroid treatment for airway inflammation as well.   Orders: No orders of the defined types were placed in this encounter.  Meds ordered this encounter  Medications   predniSONE (DELTASONE) 10 MG tablet    Sig: Take 4 tablets (40 mg total) by mouth daily with breakfast for 2 days, THEN 3 tablets (30 mg total) daily with breakfast for 2 days, THEN 2 tablets (20 mg total) daily with breakfast for 2 days, THEN 1 tablet (10 mg total) daily with breakfast for 2 days.    Dispense:  20 tablet    Refill:  0   benzonatate (TESSALON) 200 MG capsule    Sig: Take 1 capsule (200 mg total) by mouth 3 (three) times daily as needed for cough.    Dispense:  20 capsule    Refill:  0      Follow-Up Instructions: No follow-ups on file.   Collier Salina, MD  Note - This record has been created using [employer].  Chart creation errors have been sought, but may not always  have been located. Such creation errors do not reflect on  the standard of medical care.

## 2020-12-26 ENCOUNTER — Other Ambulatory Visit: Payer: Self-pay

## 2020-12-26 ENCOUNTER — Encounter: Payer: Self-pay | Admitting: Internal Medicine

## 2020-12-26 ENCOUNTER — Ambulatory Visit (INDEPENDENT_AMBULATORY_CARE_PROVIDER_SITE_OTHER): Payer: 59 | Admitting: Internal Medicine

## 2020-12-26 VITALS — BP 152/94 | HR 68 | Ht 71.0 in | Wt 192.8 lb

## 2020-12-26 DIAGNOSIS — R059 Cough, unspecified: Secondary | ICD-10-CM | POA: Insufficient documentation

## 2020-12-26 DIAGNOSIS — L4050 Arthropathic psoriasis, unspecified: Secondary | ICD-10-CM | POA: Diagnosis not present

## 2020-12-26 DIAGNOSIS — M1A0710 Idiopathic chronic gout, right ankle and foot, without tophus (tophi): Secondary | ICD-10-CM | POA: Diagnosis not present

## 2020-12-26 MED ORDER — PREDNISONE 10 MG PO TABS: ORAL_TABLET | ORAL | 0 refills | Status: AC

## 2020-12-26 MED ORDER — BENZONATATE 200 MG PO CAPS: 200.0000 mg | ORAL_CAPSULE | Freq: Three times a day (TID) | ORAL | 0 refills | Status: DC | PRN

## 2020-12-27 ENCOUNTER — Encounter: Payer: 59 | Admitting: Psychology

## 2021-01-02 ENCOUNTER — Ambulatory Visit: Payer: 59 | Admitting: Internal Medicine

## 2021-01-04 ENCOUNTER — Ambulatory Visit: Payer: 59 | Admitting: Psychology

## 2021-01-05 ENCOUNTER — Ambulatory Visit: Payer: 59 | Admitting: Neurology

## 2021-01-22 NOTE — Progress Notes (Signed)
Office Visit Note  Patient: Raymond Long             Date of Birth: 01-12-1961           MRN: [id number]             PCP: Dorothyann Peng, NP Referring: Dorothyann Peng, NP Visit Date: 01/23/2021   Subjective:   History of Present Illness: Raymond Long is a 60 y.o. male here for follow up with seronegative arthritis and chronic gout. At our last visit we discontinued methotrexate and started short prednisone course for respiratory symptoms. He feels symptoms are doing better than in some time at present, still having some swelling and pain at the MTP joints.   Previous HPI 12/26/20 Raymond Long is a 60 y.o. male here for follow up for seronegative arthritis and chronic gout. He started taking methotrexate after June visit was tolerating this medicine okay although experienced a gout flare that was treated with prednisone and now experiencing bad cough and hoarseness since almost 2 weeks ago. He felt his joint symptoms improved noticeably very quickly however then had a gout flare and then developed coughing, hoarseness, oral ulcers on bottom of his tongue, and weight loss with lack of appetite. He took prednisone for a short time to abort a gout flare then stopped these again. He stopped taking the methotrexate but still has continued symptoms. Cough has persisted and not improved with OTC or prescribed cough medicines including codeine.     10/26/20 Raymond Long is a 60 y.o. male here for follow up for his chronic joint pain of multiple sites and skin rashes.  He has continued on the gluten-free diet and thinks there is definitely some improvement with the skin disease while doing this.  Laboratory testing at his initial visit showed a uric acid of 9.2 but was otherwise negative for acute inflammatory markers or rheumatoid arthritis serology or HLA-B27 allele.  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   Review of Systems  Constitutional:  Positive for fatigue.  HENT:  Negative for mouth sores, mouth dryness and nose dryness.   Eyes:  Negative for pain, itching and dryness.  Respiratory:  Positive for shortness of breath. Negative for difficulty breathing.   Cardiovascular:  Positive for chest pain. Negative for palpitations.  Gastrointestinal:  Positive for diarrhea. Negative for blood in stool and constipation.  Endocrine: Negative for increased urination.  Genitourinary:  Negative for difficulty urinating.  Musculoskeletal:  Positive for joint pain, joint pain, joint swelling and morning stiffness. Negative for myalgias, muscle tenderness and myalgias.  Skin:  Negative for color change, rash and redness.  Allergic/Immunologic: Negative for susceptible to infections.  Neurological:  Positive for dizziness and numbness. Negative for headaches, memory loss and weakness.  Hematological:  Negative for bruising/bleeding tendency.  Psychiatric/Behavioral:  Negative for confusion.    PMFS History:  Patient Active Problem List   Diagnosis Date Noted   Cough 12/26/2020   Psoriatic arthritis ([place]) 10/26/2020   Rash and other nonspecific skin eruption 10/06/2020   History of revision of total replacement of left hip joint 01/05/2019   Insomnia 05/29/2017   OSA (obstructive sleep apnea) 05/29/2017   Gastroesophageal reflux with apnea 05/29/2017   Generalized headache 01/25/2017   Testicular pain, left 04/26/2016   Routine general medical examination at a health care facility 10/12/2015  Essential hypertension 09/19/2015   Malignant melanoma of skin of left thigh ([place]) 04/06/2013   Skin cancer, basal cell 10/28/2012   Fasting hyperglycemia 10/28/2012   Other abnormal glucose 10/23/2012   GOUT 10/11/2008   ERECTILE DYSFUNCTION 06/15/2008   Pain in joint 06/15/2008   Hyperlipidemia 04/23/2008   CARPAL TUNNEL SYNDROME  04/23/2008   INGUINAL PAIN, LEFT 11/20/2006    Past Medical History:  Diagnosis Date   Arthralgia of wrist, left    Cancer (HCC)    Skin   Erectile dysfunction    Gout    Foot   Hyperlipidemia    Hypertension    Insomnia    PONV (postoperative nausea and vomiting)     Family History  Problem Relation Age of Onset   Heart attack Mother        < 56   Dementia Mother    Heart attack Father 3   COPD Sister    Breast cancer Sister    Past Surgical History:  Procedure Laterality Date   COLONOSCOPY     @ 39 due to rectal symptoms   HERNIA REPAIR  2008   Bilateral   MELANOMA EXCISION  12/13/2015   Located behind right ear   MELANOMA EXCISION WITH SENTINEL LYMPH NODE BIOPSY Left 01/15/2013   Procedure: EXCISION LEFT LEG MELANOMA WITH LEFT INGUINAL SENTINEL NODE BIOPSY;  Surgeon: Rolm Bookbinder, MD;  Location: [facility];  Service: General;  Laterality: Left;   TONSILLECTOMY  age 89   TOTAL HIP ARTHROPLASTY     Social History   Social History Narrative   He works in Product manager   No kids       Likes to be outside and hike.          Immunization History  Administered Date(s) Administered   Influenza,inj,Quad PF,6+ Mos 01/25/2017, 04/09/2019   Moderna Sars-Covid-2 Vaccination 07/21/2019, 08/18/2019   Tdap 04/09/2019     Objective: Vital Signs: BP (!) 144/85 (BP Location: Right Arm, Patient Position: Sitting, Cuff Size: Normal)   Pulse 70   Ht 5' 11"  (1.803 m)   Wt 195 lb (88.5 kg)   BMI 27.20 kg/m    Physical Exam Eyes:     Conjunctiva/sclera: Conjunctivae normal.  Cardiovascular:     Rate and Rhythm: Normal rate and regular rhythm.  Pulmonary:     Effort: Pulmonary effort is normal.     Breath sounds: Normal breath sounds.  Skin:    General: Skin is warm and dry.  Neurological:     Mental Status: He is alert.  Psychiatric:        Mood and Affect: Mood normal.     Musculoskeletal Exam:  Shoulders full ROM no  tenderness or swelling Elbows full ROM no tenderness or swelling Wrists full ROM no tenderness or swelling Fingers full ROM no tenderness or synovitis Knees full ROM no tenderness or swelling patellofemoral crepitus present Ankles full ROM no tenderness or swelling Large mildly erythematous MTP joints b/l, severely reduced ROM o right    Investigation: No additional findings.  Imaging: No results found.  Recent Labs: Lab Results  Component Value Date   WBC 5.3 11/21/2020   HGB 14.7 11/21/2020   PLT 169 11/21/2020   NA 140 11/21/2020   K 4.1 11/21/2020   CL 103 11/21/2020   CO2 29 11/21/2020   GLUCOSE 89 11/21/2020   BUN 20 11/21/2020   CREATININE 1.24 11/21/2020   BILITOT 0.6 11/21/2020  ALKPHOS 80 09/08/2020   AST 24 11/21/2020   ALT 34 11/21/2020   PROT 6.8 11/21/2020   ALBUMIN 4.5 09/08/2020   CALCIUM 9.4 11/21/2020   GFRAA 82 (L) 01/12/2013    Speciality Comments: No specialty comments available.  Procedures:  No procedures performed Allergies: Allopurinol, Atorvastatin, Citalopram, Ezetimibe-simvastatin, Fluoxetine hcl, Methotrexate derivatives, Rosuvastatin, Venlafaxine, Seroquel [quetiapine fumarate], and Trazodone and nefazodone   Assessment / Plan:     Visit Diagnoses: Psoriatic arthritis ([place])  Symptoms doing pretty well today, possible from the time off work vs residual benefit of the prednisone. Plan to follow conservatively for now. Future treatment options possible candidate for otezla or maybe bDMARD depending on findings.  Chronic gout of right ankle, unspecified cause  Ongoing pain mostly at the 1st MTPs which have chronic erosive and degenerative change. No obvious swelling at this time and has not taken colchicine in a month for any flares. Would plan to check uric acid level in next f/u when considering options.  Rash and other nonspecific skin eruption  No current complaints, multiple chronic lesions remain, back of torso lesions are doing  better since taking the prednisone for respiratory symptoms.  Cough  Now improved, suspect for pneumonitis vs infectious with bronchitis.   Orders: No orders of the defined types were placed in this encounter.  No orders of the defined types were placed in this encounter.    Follow-Up Instructions: Return in about 3 months (around 04/24/2021) for PsA+Gout f/u ~39mo.   CCollier Salina MD  Note - This record has been created using [employer]  Chart creation errors have been sought, but may not always  have been located. Such creation errors do not reflect on  the standard of medical care.

## 2021-01-23 ENCOUNTER — Ambulatory Visit (INDEPENDENT_AMBULATORY_CARE_PROVIDER_SITE_OTHER): Payer: 59 | Admitting: Internal Medicine

## 2021-01-23 ENCOUNTER — Other Ambulatory Visit: Payer: Self-pay

## 2021-01-23 ENCOUNTER — Encounter: Payer: Self-pay | Admitting: Internal Medicine

## 2021-01-23 ENCOUNTER — Other Ambulatory Visit: Payer: Self-pay | Admitting: Internal Medicine

## 2021-01-23 VITALS — BP 144/85 | HR 70 | Ht 71.0 in | Wt 195.0 lb

## 2021-01-23 DIAGNOSIS — L4050 Arthropathic psoriasis, unspecified: Secondary | ICD-10-CM

## 2021-01-23 DIAGNOSIS — R059 Cough, unspecified: Secondary | ICD-10-CM | POA: Diagnosis not present

## 2021-01-23 DIAGNOSIS — R21 Rash and other nonspecific skin eruption: Secondary | ICD-10-CM | POA: Diagnosis not present

## 2021-01-23 DIAGNOSIS — M1A0710 Idiopathic chronic gout, right ankle and foot, without tophus (tophi): Secondary | ICD-10-CM

## 2021-01-26 ENCOUNTER — Ambulatory Visit: Payer: 59 | Admitting: Internal Medicine

## 2021-02-27 ENCOUNTER — Encounter: Payer: Self-pay | Admitting: Gastroenterology

## 2021-03-09 ENCOUNTER — Telehealth: Payer: 59 | Admitting: Internal Medicine

## 2021-03-21 ENCOUNTER — Other Ambulatory Visit: Payer: Self-pay | Admitting: Adult Health

## 2021-03-21 DIAGNOSIS — F5101 Primary insomnia: Secondary | ICD-10-CM

## 2021-03-21 DIAGNOSIS — F339 Major depressive disorder, recurrent, unspecified: Secondary | ICD-10-CM

## 2021-03-21 DIAGNOSIS — E782 Mixed hyperlipidemia: Secondary | ICD-10-CM

## 2021-03-21 DIAGNOSIS — I10 Essential (primary) hypertension: Secondary | ICD-10-CM

## 2021-04-14 ENCOUNTER — Encounter: Payer: Self-pay | Admitting: Gastroenterology

## 2021-04-14 ENCOUNTER — Ambulatory Visit (AMBULATORY_SURGERY_CENTER): Payer: Self-pay

## 2021-04-14 ENCOUNTER — Other Ambulatory Visit: Payer: Self-pay

## 2021-04-14 VITALS — Ht 71.0 in | Wt 195.0 lb

## 2021-04-14 DIAGNOSIS — Z1211 Encounter for screening for malignant neoplasm of colon: Secondary | ICD-10-CM

## 2021-04-14 MED ORDER — NA SULFATE-K SULFATE-MG SULF 17.5-3.13-1.6 GM/177ML PO SOLN: 1.0000 | Freq: Once | ORAL | 0 refills | Status: AC

## 2021-04-14 NOTE — Progress Notes (Signed)
No allergies to soy or egg Pt is not on blood thinners or diet pills Denies issues with sedation/intubation Denies atrial flutter/fib Denies constipation   Emmi instructions given to pt  Pt is aware of Covid safety and care partner requirements.  

## 2021-04-23 NOTE — Progress Notes (Signed)
Office Visit Note  Patient: Raymond Long             Date of Birth: 10-May-1960           MRN: [id number]             PCP: Dorothyann Peng, NP Referring: Dorothyann Peng, NP Visit Date: 04/24/2021   Subjective:  Follow-up (Multiple dog wounds on bil hands)   History of Present Illness: Raymond Long is a 60 y.o. male here for follow up for seronegative arthritis and gout off maintenance treatment due to suspected mucositis or pneumonitis with trial of methotrexate. He notices increasing pain and stiffness in his fingers without much swelling. Skin rashes without much change some increased peeling at scalp and ears without much erythema. He sustained a few bites from his dog to both hands during a recent trip for family holiday gathering in [place]. He has been applying neosporin to the affected areas mildly painful at this point largest cut on the right hand.  Previous HPI 01/23/21 Raymond Long is a 60 y.o. male here for follow up with seronegative arthritis and chronic gout. At our last visit we discontinued methotrexate and started short prednisone course for respiratory symptoms. He feels symptoms are doing better than in some time at present, still having some swelling and pain at the MTP joints.   Previous HPI 10/26/20 Raymond Long is a 60 y.o. male here for follow up for his chronic joint pain of multiple sites and skin rashes.  He has continued on the gluten-free diet and thinks there is definitely some improvement with the skin disease while doing this. Laboratory testing at his initial visit showed a uric acid of 9.2 but was otherwise negative for acute inflammatory markers or rheumatoid arthritis serology or HLA-B27 allele.  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     Review of Systems  Constitutional:  Negative for fatigue.   HENT:  Negative for mouth dryness.   Eyes:  Positive for dryness.  Respiratory:  Negative for shortness of breath.   Cardiovascular:  Negative for swelling in legs/feet.  Gastrointestinal:  Negative for constipation.  Endocrine: Negative for excessive thirst.  Genitourinary:  Negative for difficulty urinating.  Musculoskeletal:  Positive for joint pain, joint pain, joint swelling, muscle weakness, morning stiffness and muscle tenderness.  Skin:  Positive for rash and redness.  Allergic/Immunologic: Negative for susceptible to infections.  Neurological:  Negative for numbness.  Hematological:  Negative for bruising/bleeding tendency.  Psychiatric/Behavioral:  Negative for sleep disturbance.    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/05/2019   Insomnia 05/29/2017   OSA (obstructive sleep apnea) 05/29/2017   Gastroesophageal reflux with apnea 05/29/2017   Generalized headache 01/25/2017   Testicular pain, left 04/26/2016   Routine general medical examination at a health care facility 10/12/2015   Essential hypertension 09/19/2015   Malignant melanoma of skin of left thigh ([place]) 04/06/2013   Skin cancer, basal cell 10/28/2012   Fasting hyperglycemia 10/28/2012   Other abnormal glucose 10/23/2012   GOUT 10/11/2008   Pain in joint  06/15/2008   Hyperlipidemia 04/23/2008   CARPAL TUNNEL SYNDROME 04/23/2008   INGUINAL PAIN, LEFT 11/20/2006    Past Medical History:  Diagnosis Date   Arthralgia of wrist, left    Arthritis    psoriatic arthritis   Cancer ([place])    Skin   Erectile dysfunction    Gout    Foot   Hyperlipidemia    Hypertension    Insomnia    PONV (postoperative nausea and vomiting)     Family History   Problem Relation Age of Onset   Heart attack Mother        < 34   Dementia Mother    Heart attack Father 31   COPD Sister    Breast cancer Sister    Colon cancer Neg Hx    Colon polyps Neg Hx    Esophageal cancer Neg Hx    Rectal cancer Neg Hx    Stomach cancer Neg Hx    Past Surgical History:  Procedure Laterality Date   COLONOSCOPY     @ 65 due to rectal symptoms   HERNIA REPAIR  2008   Bilateral   MELANOMA EXCISION  12/13/2015   Located behind right ear   MELANOMA EXCISION WITH SENTINEL LYMPH NODE BIOPSY Left 01/15/2013   Procedure: EXCISION LEFT LEG MELANOMA WITH LEFT INGUINAL SENTINEL NODE BIOPSY;  Surgeon: Rolm Bookbinder, MD;  Location: [facility];  Service: General;  Laterality: Left;   TONSILLECTOMY  age 40   TOTAL HIP ARTHROPLASTY     Social History   Social History Narrative   He works in Product manager   No kids       Likes to be outside and hike.          Immunization History  Administered Date(s) Administered   Influenza,inj,Quad PF,6+ Mos 01/25/2017, 04/09/2019   Moderna Sars-Covid-2 Vaccination 07/21/2019, 08/18/2019   Tdap 04/09/2019     Objective: Vital Signs: BP 134/73 (BP Location: Left Arm, Patient Position: Sitting, Cuff Size: Normal)    Pulse 66    Resp 15    Ht 6' (1.829 m)    Wt 197 lb (89.4 kg)    BMI 26.72 kg/m    Physical Exam Cardiovascular:     Rate and Rhythm: Normal rate and regular rhythm.  Pulmonary:     Effort: Pulmonary effort is normal.     Breath sounds: Normal breath sounds.  Musculoskeletal:     Right lower leg: No edema.     Left lower leg: No edema.  Skin:    General: Skin is warm and dry.     Comments: Scalp rash with peeling, actinic keratoses on ear helices, mild rash on elbow and forearm extensor surfaces, multiple small erythematous skin patches throughout back, hyperpigmented nodules with central scabbing on shins  Neurological:     Mental Status: He is alert.  Psychiatric:         Mood and Affect: Mood normal.      Musculoskeletal Exam:  Neck full ROM no tenderness Shoulders full ROM no tenderness or swelling Elbows full ROM no tenderness or swelling Wrists full ROM no tenderness or swelling Fingers full ROM heberdon's nodes on fingers both hands Knees significant bilateral crepitus slightly decreased ROM, no significant swelling Ankles full ROM no swelling no achilles tendon tenderness    Investigation: No additional findings.  Imaging: No results found.  Recent Labs: Lab Results  Component Value Date   WBC 5.4 04/24/2021  HGB 15.7 04/24/2021   PLT 151 04/24/2021   NA 140 04/24/2021   K 4.3 04/24/2021   CL 103 04/24/2021   CO2 27 04/24/2021   GLUCOSE 88 04/24/2021   BUN 12 04/24/2021   CREATININE 1.10 04/24/2021   BILITOT 0.5 04/24/2021   ALKPHOS 80 09/08/2020   AST 17 04/24/2021   ALT 22 04/24/2021   PROT 6.7 04/24/2021   ALBUMIN 4.5 09/08/2020   CALCIUM 9.0 04/24/2021   GFRAA 82 (L) 01/12/2013    Speciality Comments: No specialty comments available.  Procedures:  No procedures performed Allergies: Allopurinol, Atorvastatin, Citalopram, Ezetimibe-simvastatin, Fluoxetine hcl, Methotrexate derivatives, Rosuvastatin, Venlafaxine, Seroquel [quetiapine fumarate], and Trazodone and nefazodone   Assessment / Plan:     Visit Diagnoses: Psoriatic arthritis ([place])  Joint pains doing reasonable well still morning stiffness and pain but no active synovitis on exam. Based on ongoing skin rashes with some worsening and joint pains would benefit with DMARD treatment. He is interested in staying with oral medication and minimizing side effects with previous bad reaction to methotrexate. Will plan to start [place].  Rash and other nonspecific skin eruption  Mildly increased skin rashes and peeling also few new nodule areas. Back remains multiple erythematous patches that had improved on methotrexate returned. Possible increased with winter  weather no major flare.  Chronic gout of right ankle, unspecified cause  No episodes of major joint pain and swelling returning since last visit.  Dog bite, initial encounter  No obvious signs of cellulitis no movement deficits and wounds all appear closed very slight serous drainage from one on right hand dorsum. Will treat with prophylaxis augmentin BID for 3 days with the numerous wounds.  High risk medication use - Plan: CBC with Differential/Platelet, COMPLETE METABOLIC PANEL WITH GFR, QuantiFERON-TB Gold Plus  Anticipate starting new DMARD treatment today checking CBC, CMP, and TB screening. Previous negative hepatitis serology.   Orders: Orders Placed This Encounter  Procedures   CBC with Differential/Platelet   COMPLETE METABOLIC PANEL WITH GFR   QuantiFERON-TB Gold Plus   Meds ordered this encounter  Medications   amoxicillin-clavulanate (AUGMENTIN) 875-125 MG tablet    Sig: Take 1 tablet by mouth 2 (two) times daily.    Dispense:  6 tablet    Refill:  0     Follow-Up Instructions: Return in about 3 months (around 07/23/2021) for PsA Otezla start f/u 76mo.   CCollier Salina MD  Note - This record has been created using [employer]  Chart creation errors have been sought, but may not always  have been located. Such creation errors do not reflect on  the standard of medical care.

## 2021-04-24 ENCOUNTER — Ambulatory Visit (INDEPENDENT_AMBULATORY_CARE_PROVIDER_SITE_OTHER): Payer: 59 | Admitting: Internal Medicine

## 2021-04-24 ENCOUNTER — Encounter: Payer: Self-pay | Admitting: Internal Medicine

## 2021-04-24 ENCOUNTER — Other Ambulatory Visit: Payer: Self-pay

## 2021-04-24 VITALS — BP 134/73 | HR 66 | Resp 15 | Ht 72.0 in | Wt 197.0 lb

## 2021-04-24 DIAGNOSIS — R21 Rash and other nonspecific skin eruption: Secondary | ICD-10-CM | POA: Diagnosis not present

## 2021-04-24 DIAGNOSIS — M1A0710 Idiopathic chronic gout, right ankle and foot, without tophus (tophi): Secondary | ICD-10-CM

## 2021-04-24 DIAGNOSIS — L4050 Arthropathic psoriasis, unspecified: Secondary | ICD-10-CM | POA: Diagnosis not present

## 2021-04-24 DIAGNOSIS — W540XXA Bitten by dog, initial encounter: Secondary | ICD-10-CM | POA: Diagnosis not present

## 2021-04-24 DIAGNOSIS — Z79899 Other long term (current) drug therapy: Secondary | ICD-10-CM

## 2021-04-24 NOTE — Patient Instructions (Signed)
Apremilast oral tablets What is this medication? APREMILAST (a PRE mil ast) is used to treat plaque psoriasis, psoriatic arthritis, and certain oral ulcers. This medicine may be used for other purposes; ask your health care provider or pharmacist if you have questions. COMMON BRAND NAME(S): R[place]ford Nail What should I tell my care team before I take this medication? They need to know if you have any of these conditions: dehydration kidney disease mental illness an unusual or allergic reaction to apremilast, other medicines, foods, dyes, or preservatives pregnant or trying to get pregnant breast-feeding How should I use this medication? Take this medicine by mouth with a glass of water. Follow the directions on the prescription label. Do not cut, crush or chew this medicine. You can take it with or without food. If it upsets your stomach, take it with food. Take your medicine at regular intervals. Do not take it more often than directed. Do not stop taking except on your doctor's advice. Talk to your pediatrician regarding the use of this medicine in children. Special care may be needed. Overdosage: If you think you have taken too much of this medicine contact a poison control center or emergency room at once. NOTE: This medicine is only for you. Do not share this medicine with others. What if I miss a dose? If you miss a dose, take it as soon as you can. If it is almost time for your next dose, take only that dose. Do not take double or extra doses. What may interact with this medication? certain medicines for seizures like carbamazepine, phenobarbital, phenytoin rifampin This list may not describe all possible interactions. Give your health care provider a list of all the medicines, herbs, non-prescription drugs, or dietary supplements you use. Also tell them if you smoke, drink alcohol, or use illegal drugs. Some items may interact with your medicine. What should I watch for while using this  medication? Tell your doctor or healthcare professional if your symptoms do not start to get better or if they get worse. Patients and their families should watch out for new or worsening depression or thoughts of suicide. Also watch out for sudden changes in feelings such as feeling anxious, agitated, panicky, irritable, hostile, aggressive, impulsive, severely restless, overly excited and hyperactive, or not being able to sleep. If this happens, call your health care professional. Check with your doctor or health care professional if you get an attack of severe diarrhea, nausea and vomiting, or if you sweat a lot. The loss of too much body fluid can make it dangerous for you to take this medicine. What side effects may I notice from receiving this medication? Side effects that you should report to your care team as soon as possible: Allergic reactions or angioedema--skin rash, itching, hives, swelling of the face, eyes, lips, tongue, arms, or legs, trouble swallowing or breathing Depressed mood Weight loss Side effects that usually do not require medical attention (report to your care team if they continue or are bothersome): Diarrhea Headache Nausea Vomiting This list may not describe all possible side effects. Call your doctor for medical advice about side effects. You may report side effects to FDA at 1-800-FDA-1088. Where should I keep my medication? Keep out of the reach of children. Store below 30 degrees C (86 degrees F). Throw away any unused medicine after the expiration date. NOTE: This sheet is a summary. It may not cover all possible information. If you have questions about this medicine, talk to your doctor, pharmacist, or  health care provider.  2022 Elsevier/Gold Standard (2020-08-08 00:00:00)

## 2021-04-25 ENCOUNTER — Ambulatory Visit (AMBULATORY_SURGERY_CENTER): Payer: 59 | Admitting: Gastroenterology

## 2021-04-25 ENCOUNTER — Encounter: Payer: Self-pay | Admitting: Gastroenterology

## 2021-04-25 VITALS — BP 101/75 | HR 57 | Temp 97.8°F | Resp 16 | Ht 71.0 in | Wt 195.0 lb

## 2021-04-25 DIAGNOSIS — K635 Polyp of colon: Secondary | ICD-10-CM | POA: Diagnosis not present

## 2021-04-25 DIAGNOSIS — Z1211 Encounter for screening for malignant neoplasm of colon: Secondary | ICD-10-CM

## 2021-04-25 DIAGNOSIS — D123 Benign neoplasm of transverse colon: Secondary | ICD-10-CM

## 2021-04-25 MED ORDER — SODIUM CHLORIDE 0.9 % IV SOLN: 500.0000 mL | Freq: Once | INTRAVENOUS | Status: DC

## 2021-04-25 MED ORDER — AMOXICILLIN-POT CLAVULANATE 875-125 MG PO TABS
1.0000 | ORAL_TABLET | Freq: Two times a day (BID) | ORAL | 0 refills | Status: DC
Start: 2021-04-25 — End: 2021-10-27

## 2021-04-25 NOTE — Progress Notes (Signed)
[facility] History and Physical   Primary Care Physician:  Dorothyann Peng, NP   Reason for Procedure:   Colon cancer screening  Plan:    Screening colonoscopy     HPI: Raymond Long is a 60 y.o. male undergoing average risk screening colonoscopy.  He has no family history of colon cancer and no chronic GI symptoms.  He had a colonoscopy in 2003 to evaluate some 'GI issues' which was normal.     Past Medical History:  Diagnosis Date   Arthralgia of wrist, left    Arthritis    psoriatic arthritis   Cancer ([place])    Skin   Erectile dysfunction    Gout    Foot   Hyperlipidemia    Hypertension    Insomnia    PONV (postoperative nausea and vomiting)     Past Surgical History:  Procedure Laterality Date   COLONOSCOPY     @ 49 due to rectal symptoms   HERNIA REPAIR  2008   Bilateral   MELANOMA EXCISION  12/13/2015   Located behind right ear   MELANOMA EXCISION WITH SENTINEL LYMPH NODE BIOPSY Left 01/15/2013   Procedure: EXCISION LEFT LEG MELANOMA WITH LEFT INGUINAL SENTINEL NODE BIOPSY;  Surgeon: Rolm Bookbinder, MD;  Location: [place] [facility];  Service: General;  Laterality: Left;   TONSILLECTOMY  age 33   TOTAL HIP ARTHROPLASTY      Prior to Admission medications   Medication Sig Start Date End Date Taking? Authorizing Provider  amLODipine (NORVASC) 2.5 MG tablet Take 1 tablet (2.5 mg total) by mouth daily. 09/07/20  Yes Nafziger, [name], NP  folic acid (FOLVITE) 1 MG tablet Take 1 tablet (1 mg total) by mouth daily. 10/26/20  Yes Rice, Resa Miner, MD  hydrOXYzine (ATARAX/VISTARIL) 25 MG tablet Take 25 mg by mouth at bedtime.   Yes [provider]  mirtazapine (REMERON) 30 MG tablet TAKE ONE TABLET BY MOUTH EVERY NIGHT AT BEDTIME 03/21/21  Yes Nafziger, [name], NP  Multiple Vitamins-Minerals (ZINC PO) Take by mouth.   Yes [provider]  benzonatate (TESSALON) 200 MG capsule Take 1 capsule (200 mg total) by mouth 3 (three) times  daily as needed for cough. Patient not taking: Reported on 04/14/2021 12/26/20   Collier Salina, MD  colchicine 0.6 MG tablet Take 1 tablet 1 to 2 times daily as needed for gout flares 12/07/20   Collier Salina, MD  guaiFENesin-codeine 100-10 MG/5ML syrup Take by mouth. Patient not taking: Reported on 04/14/2021 12/15/20   [provider]  imiquimod [place]) 5 % cream SMARTSIG:1 Topical Daily PRN 08/31/20   [provider]  predniSONE (DELTASONE) 5 MG tablet Take 1 tablet (5 mg total) by mouth daily as needed (for flare up). Patient not taking: Reported on 04/14/2021 11/02/20   Collier Salina, MD    Current Outpatient Medications  Medication Sig Dispense Refill   amLODipine (NORVASC) 2.5 MG tablet Take 1 tablet (2.5 mg total) by mouth daily. 90 tablet 3   folic acid (FOLVITE) 1 MG tablet Take 1 tablet (1 mg total) by mouth daily. 90 tablet 0   hydrOXYzine (ATARAX/VISTARIL) 25 MG tablet Take 25 mg by mouth at bedtime.     mirtazapine (REMERON) 30 MG tablet TAKE ONE TABLET BY MOUTH EVERY NIGHT AT BEDTIME 30 tablet 2   Multiple Vitamins-Minerals (ZINC PO) Take by mouth.     benzonatate (TESSALON) 200 MG capsule Take 1 capsule (200 mg total) by mouth 3 (three)  times daily as needed for cough. (Patient not taking: Reported on 04/14/2021) 20 capsule 0   colchicine 0.6 MG tablet Take 1 tablet 1 to 2 times daily as needed for gout flares 30 tablet 2   guaiFENesin-codeine 100-10 MG/5ML syrup Take by mouth. (Patient not taking: Reported on 04/14/2021)     imiquimod (ALDARA) 5 % cream SMARTSIG:1 Topical Daily PRN     predniSONE (DELTASONE) 5 MG tablet Take 1 tablet (5 mg total) by mouth daily as needed (for flare up). (Patient not taking: Reported on 04/14/2021) 30 tablet 0   Current Facility-Administered Medications  Medication Dose Route Frequency Provider Last Rate Last Admin   0.9 %  sodium chloride infusion  500 mL Intravenous Once Daryel November, MD        Allergies  as of 04/25/2021 - Review Complete 04/25/2021  Allergen Reaction Noted   Allopurinol  09/03/2018   Atorvastatin     Citalopram     Ezetimibe-simvastatin     Fluoxetine hcl     Methotrexate derivatives Other (See Comments) 12/26/2020   Rosuvastatin     Venlafaxine     Seroquel [quetiapine fumarate] Palpitations 08/15/2017   Trazodone and nefazodone Palpitations 02/04/2017    Family History  Problem Relation Age of Onset   Heart attack Mother        < 25   Dementia Mother    Heart attack Father 61   COPD Sister    Breast cancer Sister    Colon cancer Neg Hx    Colon polyps Neg Hx    Esophageal cancer Neg Hx    Rectal cancer Neg Hx    Stomach cancer Neg Hx     Social History   Socioeconomic History   Marital status: Soil scientist    Spouse name: Not on file   Number of children: Not on file   Years of education: Not on file   Highest education level: Not on file  Occupational History   Not on file  Tobacco Use   Smoking status: Former    Packs/day: 2.00    Years: 20.00    Pack years: 40.00    Types: Cigarettes    Quit date: 01/09/2001    Years since quitting: 20.3   Smokeless tobacco: Never   Tobacco comments:    1978-2002, up to 2 ppd  Vaping Use   Vaping Use: Never used  Substance and Sexual Activity   Alcohol use: No    Comment:  in AA   Drug use: No   Sexual activity: Not on file  Other Topics Concern   Not on file  Social History Narrative   He works in Product manager   No kids       Likes to be outside and hike.          Social Determinants of Health   Financial Resource Strain: Not on file  Food Insecurity: Not on file  Transportation Needs: Not on file  Physical Activity: Not on file  Stress: Not on file  Social Connections: Not on file  Intimate Partner Violence: Not on file    Review of Systems:  All other review of systems negative except as mentioned in the HPI.  Physical Exam: Vital signs BP (!) 144/80     Pulse 63    Temp 97.8 F (36.6 C)    Ht 5\' 11"  (1.803 m)    Wt 195 lb (88.5 kg)    SpO2 98%  BMI 27.20 kg/m   General:   Alert,  Well-developed, well-nourished, pleasant and cooperative in NAD Airway:  Mallampati 2 Lungs:  Clear throughout to auscultation.   Heart:  Regular rate and rhythm; no murmurs, clicks, rubs,  or gallops. Abdomen:  Soft, nontender and nondistended. Normal bowel sounds.   Neuro/Psych:  Normal mood and affect. A and O x 3   Lorielle Boehning E. Candis Schatz, MD [facility]

## 2021-04-25 NOTE — Progress Notes (Signed)
Pt's states no medical or surgical changes since previsit or office visit. 

## 2021-04-25 NOTE — Progress Notes (Signed)
Called to room to assist during endoscopic procedure.  Patient ID and intended procedure confirmed with present staff. Received instructions for my participation in the procedure from the performing physician.  

## 2021-04-25 NOTE — Patient Instructions (Signed)
1 polyp removed- await pathology  Please read over handouts about polyps and diverticulosis  Continue normal medications   YOU HAD AN ENDOSCOPIC PROCEDURE TODAY AT [facility]:   Refer to the procedure report that was given to you for any specific questions about what was found during the examination.  If the procedure report does not answer your questions, please call your gastroenterologist to clarify.  If you requested that your care partner not be given the details of your procedure findings, then the procedure report has been included in a sealed envelope for you to review at your convenience later.  YOU SHOULD EXPECT: Some feelings of bloating in the abdomen. Passage of more gas than usual.  Walking can help get rid of the air that was put into your GI tract during the procedure and reduce the bloating. If you had a lower endoscopy (such as a colonoscopy or flexible sigmoidoscopy) you may notice spotting of blood in your stool or on the toilet paper. If you underwent a bowel prep for your procedure, you may not have a normal bowel movement for a few days.  Please Note:  You might notice some irritation and congestion in your nose or some drainage.  This is from the oxygen used during your procedure.  There is no need for concern and it should clear up in a day or so.  SYMPTOMS TO REPORT IMMEDIATELY:  Following lower endoscopy (colonoscopy or flexible sigmoidoscopy):  Excessive amounts of blood in the stool  Significant tenderness or worsening of abdominal pains  Swelling of the abdomen that is new, acute  Fever of 100F or higher  For urgent or emergent issues, a gastroenterologist can be reached at any hour by calling [phone]. Do not use MyChart messaging for urgent concerns.    DIET:  We do recommend a small meal at first, but then you may proceed to your regular diet.  Drink plenty of fluids but you should avoid alcoholic beverages for 24 hours.  ACTIVITY:   You should plan to take it easy for the rest of today and you should NOT DRIVE or use heavy machinery until tomorrow (because of the sedation medicines used during the test).    FOLLOW UP: Our staff will call the number listed on your records 48-72 hours following your procedure to check on you and address any questions or concerns that you may have regarding the information given to you following your procedure. If we do not reach you, we will leave a message.  We will attempt to reach you two times.  During this call, we will ask if you have developed any symptoms of COVID 19. If you develop any symptoms (ie: fever, flu-like symptoms, shortness of breath, cough etc.) before then, please call [phone].  If you test positive for Covid 19 in the 2 weeks post procedure, please call and report this information to [place].    If any biopsies were taken you will be contacted by phone or by letter within the next 1-3 weeks.  Please call us at [phone] if you have not heard about the biopsies in 3 weeks.    SIGNATURES/CONFIDENTIALITY: You and/or your care partner have signed paperwork which will be entered into your electronic medical record.  These signatures attest to the fact that that the information above on your After Visit Summary has been reviewed and is understood.  Full responsibility of the confidentiality of this discharge information lies with you and/or your care-partner.

## 2021-04-25 NOTE — Progress Notes (Signed)
PT taken to PACU. Monitors in place. VSS. Report given to RN. 

## 2021-04-25 NOTE — Op Note (Signed)
[name] Patient Name: Raymond Long Procedure Date: 04/25/2021 12:10 PM MRN: [id number] Endoscopist: Nicki Reaper E. Candis Schatz , MD Age: 60 Referring MD:  Date of Birth: 1960/12/29 Gender: Male Account #: [id number] Procedure:                Colonoscopy Indications:              Screening for colorectal malignant neoplasm Medicines:                Monitored Anesthesia Care Procedure:                Pre-Anesthesia Assessment:                           - Prior to the procedure, a History and Physical                            was performed, and patient medications and                            allergies were reviewed. The patient's tolerance of                            previous anesthesia was also reviewed. The risks                            and benefits of the procedure and the sedation                            options and risks were discussed with the patient.                            All questions were answered, and informed consent                            was obtained. Prior Anticoagulants: The patient has                            taken no previous anticoagulant or antiplatelet                            agents. ASA Grade Assessment: II - A patient with                            mild systemic disease. After reviewing the risks                            and benefits, the patient was deemed in                            satisfactory condition to undergo the procedure.                           After obtaining informed consent, the colonoscope  was passed under direct vision. Throughout the                            procedure, the patient's blood pressure, pulse, and                            oxygen saturations were monitored continuously. The                            CF [id number] #[id number] was introduced through the anus                            and advanced to the the terminal ileum, with                            identification  of the appendiceal orifice and IC                            valve. The colonoscopy was somewhat difficult due                            to significant looping. Successful completion of                            the procedure was aided by using manual pressure.                            The patient tolerated the procedure well. The                            quality of the bowel preparation was adequate. The                            terminal ileum, ileocecal valve, appendiceal                            orifice, and rectum were photographed. Scope In: 12:20:30 PM Scope Out: 12:40:16 PM Scope Withdrawal Time: 0 hours 9 minutes 39 seconds  Total Procedure Duration: 0 hours 19 minutes 46 seconds  Findings:                 The perianal and digital rectal examinations were                            normal. Pertinent negatives include normal                            sphincter tone and no palpable rectal lesions.                           A 3 mm polyp was found in the splenic flexure. The                            polyp was sessile. The  polyp was removed with a                            cold snare. Resection and retrieval were complete.                            Estimated blood loss was minimal.                           The exam was otherwise normal throughout the                            examined colon.                           A few small-mouthed diverticula were found in the                            sigmoid colon.                           The terminal ileum contained a single diminutive                            diverticulum.                           The remainder of the exam in the terminal ileum was                            normal.                           The retroflexed view of the distal rectum and anal                            verge was normal and showed no anal or rectal                            abnormalities. Complications:            No immediate  complications. Estimated Blood Loss:     Estimated blood loss was minimal. Impression:               - One 3 mm polyp at the splenic flexure, removed                            with a cold snare. Resected and retrieved.                           - Diverticulosis in the sigmoid colon.                           - Ileal diverticulum.                           - The distal rectum and anal  verge are normal on                            retroflexion view. Recommendation:           - Patient has a contact number available for                            emergencies. The signs and symptoms of potential                            delayed complications were discussed with the                            patient. Return to normal activities tomorrow.                            Written discharge instructions were provided to the                            patient.                           - Resume previous diet.                           - Continue present medications.                           - Await pathology results.                           - Repeat colonoscopy in 10 years for surveillance. [name] E. Candis Schatz, MD 04/25/2021 12:45:31 PM This report has been signed electronically.

## 2021-04-25 NOTE — Progress Notes (Signed)
A.G. vital signs. 

## 2021-04-26 ENCOUNTER — Telehealth: Payer: Self-pay | Admitting: Internal Medicine

## 2021-04-26 ENCOUNTER — Encounter: Payer: Self-pay | Admitting: Internal Medicine

## 2021-04-26 NOTE — Telephone Encounter (Signed)
The prescription I sent was the antibiotic for his dog bites. [place] is not yet approved so no prescription yet. His TB screening test is still pending that was checked for this.

## 2021-04-26 NOTE — Telephone Encounter (Signed)
Patient left a voicemail stating he went to pick up a prescription and they did not fill [place]. Patient stated the pharmacy never received a prescription for [facility]. Patient requests a refill to be sent to [facility] on [address].

## 2021-04-26 NOTE — Telephone Encounter (Signed)
See my chart message for details. Responded to patient's message to advise prescription sent was the antibiotic for his dog bites. [place] N[place] is not yet approved so no prescription yet. His TB screening test is still pending that was checked for this.

## 2021-04-27 ENCOUNTER — Telehealth: Payer: Self-pay

## 2021-04-27 ENCOUNTER — Telehealth: Payer: Self-pay | Admitting: Pharmacist

## 2021-04-27 ENCOUNTER — Other Ambulatory Visit (HOSPITAL_COMMUNITY): Payer: Self-pay

## 2021-04-27 DIAGNOSIS — L4050 Arthropathic psoriasis, unspecified: Secondary | ICD-10-CM

## 2021-04-27 LAB — QUANTIFERON-TB GOLD PLUS
Mitogen-NIL: >10 IU/mL
NIL: 0.06 IU/mL
QuantiFERON-TB Gold Plus: NEGATIVE
TB1-NIL: <0 IU/mL
TB2-NIL: 0 IU/mL

## 2021-04-27 LAB — COMPLETE METABOLIC PANEL WITH GFR
AG Ratio: 1.9 (calc) (ref 1.0–2.5)
ALT: 22 U/L (ref 9–46)
AST: 17 U/L (ref 10–35)
Albumin: 4.4 g/dL (ref 3.6–5.1)
Alkaline phosphatase (APISO): 69 U/L (ref 35–144)
BUN: 12 mg/dL (ref 7–25)
CO2: 27 mmol/L (ref 20–32)
Calcium: 9 mg/dL (ref 8.6–10.3)
Chloride: 103 mmol/L (ref 98–110)
Creat: 1.1 mg/dL (ref 0.70–1.35)
Globulin: 2.3 g/dL (calc) (ref 1.9–3.7)
Glucose, Bld: 88 mg/dL (ref 65–99)
Potassium: 4.3 mmol/L (ref 3.5–5.3)
Sodium: 140 mmol/L (ref 135–146)
Total Bilirubin: 0.5 mg/dL (ref 0.2–1.2)
Total Protein: 6.7 g/dL (ref 6.1–8.1)
eGFR: 77 mL/min/{1.73_m2} (ref >=60)

## 2021-04-27 LAB — CBC WITH DIFFERENTIAL/PLATELET
Absolute Monocytes: 513 cells/uL (ref 200–950)
Basophils Absolute: 22 cells/uL (ref 0–200)
Basophils Relative: 0.4 %
Eosinophils Absolute: 130 cells/uL (ref 15–500)
Eosinophils Relative: 2.4 %
HCT: 45.5 % (ref 38.5–50.0)
Hemoglobin: 15.7 g/dL (ref 13.2–17.1)
Lymphs Abs: 1879 cells/uL (ref 850–3900)
MCH: 31.5 pg (ref 27.0–33.0)
MCHC: 34.5 g/dL (ref 32.0–36.0)
MCV: 91.2 fL (ref 80.0–100.0)
MPV: 11.7 fL (ref 7.5–12.5)
Monocytes Relative: 9.5 %
Neutro Abs: 2857 cells/uL (ref 1500–7800)
Neutrophils Relative %: 52.9 %
Platelets: 151 10*3/uL (ref 140–400)
RBC: 4.99 10*6/uL (ref 4.20–5.80)
RDW: 12.5 % (ref 11.0–15.0)
Total Lymphocyte: 34.8 %
WBC: 5.4 10*3/uL (ref 3.8–10.8)

## 2021-04-27 NOTE — Telephone Encounter (Addendum)
Please start [place].  Will need prior auth for starter pack and maintenance dose  Starter dose: Day 1: 10 mg in the morning; Day 2: 10 mg twice daily; Day 3: 10 mg in the morning and 20 mg in the evening; Day 4: 20 mg twice daily; Day 5: 20 mg in the morning and 30 mg in the evening.; Day 6 and onwards: 30 mg twice daily 28-day Starter Pack (55 tablets)  Maintenance dose: 30mg  twice daily  Dx: Psoriatic arthritis (L40.5)  Previously tried therapies: Methotrexate - persistent cough and hoarseness, oral ulcers, weight loss with lack of appetite  Patient has Pharmacist, community and should qualify for copay card once approved.  Knox Saliva, PharmD, MPH, BCPS Clinical Pharmacist (Rheumatology and Pulmonology)  ----- Message from Collier Salina, MD sent at 04/26/2021  1:44 PM EST ----- Regarding: [place] to start [place] for Mr. Carchi if available. He had a moderately severe adverse reaction to methotrexate. Joint disease is mild to moderate no erosive changes, skin is moderately active despite topical medication. I think baseline labs are all checked and okay. He is concerned about immunosuppression with injectable biologic medication.

## 2021-04-27 NOTE — Telephone Encounter (Signed)
Received notification from [place] regarding a prior authorization for Raymond Long. Authorization has been APPROVED from 03/28/2021 to 10/24/2021.   Patient must fill through [place]: [phone]  Authorization # [id number] Key: [id number]

## 2021-04-27 NOTE — Telephone Encounter (Signed)
First attempt follow up call to pt, no answer. 

## 2021-04-27 NOTE — Telephone Encounter (Signed)
Second attempt follow up call to pt, no answer.  

## 2021-04-28 NOTE — Progress Notes (Signed)
Labs look completely fine. Pharmacy is working on the prior authorization for the [employer].

## 2021-05-03 NOTE — Progress Notes (Signed)
Raymond Long,  Good news: the polyp that I removed during your recent examination was NOT precancerous.  You should continue to follow current colorectal cancer screening guidelines with a repeat colonoscopy in 10 years.    If you develop any new rectal bleeding, abdominal pain or significant bowel habit changes, please contact me before then.

## 2021-05-12 MED ORDER — OTEZLA 30 MG PO TABS: 30.0000 mg | ORAL_TABLET | Freq: Two times a day (BID) | ORAL | 0 refills | Status: DC

## 2021-05-12 MED ORDER — OTEZLA 10 & 20 & 30 MG PO TBPK: ORAL_TABLET | ORAL | 0 refills | Status: DC

## 2021-05-12 NOTE — Telephone Encounter (Signed)
Do we need anything additional from our end now for looking into the [place]?

## 2021-05-12 NOTE — Telephone Encounter (Addendum)
Enrolled patient into [place] card. Patient should receive copay card information in email and physical card in the next 2-3 business days  Rx sent to [facility] for starter pack and maintenance dose. MyChart message sent to patient with pharmacy information and advised to call pharmacy with copay card information.  Knox Saliva, PharmD, MPH, BCPS Clinical Pharmacist (Rheumatology and Pulmonology)

## 2021-05-19 ENCOUNTER — Encounter: Payer: Self-pay | Admitting: Internal Medicine

## 2021-06-18 ENCOUNTER — Encounter: Payer: Self-pay | Admitting: Internal Medicine

## 2021-06-21 ENCOUNTER — Other Ambulatory Visit: Payer: Self-pay | Admitting: Adult Health

## 2021-06-21 DIAGNOSIS — I10 Essential (primary) hypertension: Secondary | ICD-10-CM

## 2021-06-21 DIAGNOSIS — F339 Major depressive disorder, recurrent, unspecified: Secondary | ICD-10-CM

## 2021-06-21 DIAGNOSIS — F5101 Primary insomnia: Secondary | ICD-10-CM

## 2021-06-21 DIAGNOSIS — E782 Mixed hyperlipidemia: Secondary | ICD-10-CM

## 2021-07-16 NOTE — Progress Notes (Addendum)
Office Visit Note  Patient: Raymond Long             Date of Birth: 1961-03-20           MRN: [id number]             PCP: Dorothyann Peng, NP Referring: Dorothyann Peng, NP Visit Date: 07/17/2021   Subjective:   History of Present Illness: Raymond Long is a 61 y.o. male here for follow up for psoriatic arthritis on otezla started recently and was suffering some increased GI symptoms from this.  His original amount GI side effects improved though still has somewhat loose stool on a daily basis.  However started to develop pounding or throbbing type headaches particularly in the mornings with this medication and feels fatigued.  He has noticed partial improvement of his skin in multiple areas including his entire torso and on his shins there is still one bad lesion on his left leg.  Joint pain has not improved but she still has a lot of pain and stiffness though not seeing any significant swelling.   Previous HPI 04/24/21 Raymond Long is a 61 y.o. male here for follow up for seronegative arthritis and gout off maintenance treatment due to suspected mucositis or pneumonitis with trial of methotrexate. He notices increasing pain and stiffness in his fingers without much swelling. Skin rashes without much change some increased peeling at scalp and ears without much erythema. He sustained a few bites from his dog to both hands during a recent trip for family holiday gathering in [place]. He has been applying neosporin to the affected areas mildly painful at this point largest cut on the right hand.   Previous HPI 10/26/20 Raymond Long is a 61 y.o. male here for follow up for his chronic joint pain of multiple sites and skin rashes.  He has continued on the gluten-free diet and thinks there is definitely some improvement with the skin disease while doing this. Laboratory testing at his initial visit showed a uric acid of 9.2 but was otherwise negative for acute inflammatory  markers or rheumatoid arthritis serology or HLA-B27 allele.  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   Review of Systems  Constitutional:  Positive for fatigue.  HENT:  Negative for mouth sores, mouth dryness and nose dryness.   Eyes:  Positive for dryness. Negative for pain and itching.  Respiratory:  Positive for shortness of breath. Negative for difficulty breathing.   Cardiovascular:  Positive for chest pain and palpitations.  Gastrointestinal:  Positive for diarrhea. Negative for blood in stool and constipation.  Endocrine: Negative for increased urination.  Genitourinary:  Negative for difficulty urinating.  Musculoskeletal:  Positive for joint pain, joint pain, joint swelling, myalgias, morning stiffness, muscle tenderness and myalgias.  Skin:  Positive for rash. Negative for color change and redness.  Allergic/Immunologic: Negative for susceptible to infections.  Neurological:  Positive for headaches. Negative for dizziness, numbness, memory loss and weakness.  Hematological:  Negative for bruising/bleeding tendency.  Psychiatric/Behavioral:  Negative for confusion.    PMFS History:  Patient Active Problem List   Diagnosis Date Noted   Dog bite 04/24/2021   High risk medication use 04/24/2021   Cough 12/26/2020   Psoriatic arthritis ([place]) 10/26/2020   Rash and other nonspecific skin eruption 10/06/2020   History of revision of total replacement of left  hip joint 01/05/2019   Insomnia 05/29/2017   OSA (obstructive sleep apnea) 05/29/2017   Gastroesophageal reflux with apnea 05/29/2017   Generalized headache 01/25/2017   Testicular pain, left 04/26/2016   Routine general medical examination at a health care facility 10/12/2015   Essential hypertension 09/19/2015   Malignant melanoma of skin of left thigh ([place]) 04/06/2013   Skin cancer, basal  cell 10/28/2012   Fasting hyperglycemia 10/28/2012   Other abnormal glucose 10/23/2012   GOUT 10/11/2008   Pain in joint 06/15/2008   Hyperlipidemia 04/23/2008   CARPAL TUNNEL SYNDROME 04/23/2008   INGUINAL PAIN, LEFT 11/20/2006    Past Medical History:  Diagnosis Date   Arthralgia of wrist, left    Arthritis    psoriatic arthritis   Cancer ([place])    Skin   Erectile dysfunction    Gout    Foot   Hyperlipidemia    Hypertension    Insomnia    PONV (postoperative nausea and vomiting)     Family History  Problem Relation Age of Onset   Heart attack Mother        < 65   Dementia Mother    Heart attack Father 83   COPD Sister    Breast cancer Sister    Colon cancer Neg Hx    Colon polyps Neg Hx    Esophageal cancer Neg Hx    Rectal cancer Neg Hx    Stomach cancer Neg Hx    Past Surgical History:  Procedure Laterality Date   COLONOSCOPY     @ 80 due to rectal symptoms   HERNIA REPAIR  2008   Bilateral   MELANOMA EXCISION  12/13/2015   Located behind right ear   MELANOMA EXCISION WITH SENTINEL LYMPH NODE BIOPSY Left 01/15/2013   Procedure: EXCISION LEFT LEG MELANOMA WITH LEFT INGUINAL SENTINEL NODE BIOPSY;  Surgeon: Rolm Bookbinder, MD;  Location: [facility];  Service: General;  Laterality: Left;   TONSILLECTOMY  age 74   TOTAL HIP ARTHROPLASTY     Social History   Social History Narrative   He works in Product manager   No kids       Likes to be outside and hike.          Immunization History  Administered Date(s) Administered   Influenza,inj,Quad PF,6+ Mos 01/25/2017, 04/09/2019   Moderna Sars-Covid-2 Vaccination 07/21/2019, 08/18/2019   Tdap 04/09/2019   Zoster Recombinat (Shingrix) 06/16/2021     Objective: Vital Signs: BP (!) 142/92 (BP Location: Left Arm, Patient Position: Sitting, Cuff Size: Large)    Pulse 74    Ht 5' 11"  (1.803 m)    Wt 196 lb 12.8 oz (89.3 kg)    BMI 27.45 kg/m    Physical Exam Cardiovascular:      Rate and Rhythm: Normal rate and regular rhythm.  Pulmonary:     Effort: Pulmonary effort is normal.     Breath sounds: Normal breath sounds.  Skin:    General: Skin is warm and dry.     Findings: Rash present.     Comments: Skin rashes on upper and lower back most flat few with raised nodules Flat faint erythematous patches on shins bilaterally, large left ankle medial lesion with red center  Neurological:     Mental Status: He is alert.  Psychiatric:        Mood and Affect: Mood normal.     Musculoskeletal Exam:  Neck full ROM no tenderness Shoulders  full ROM no tenderness or swelling Elbows full ROM no tenderness or swelling Wrists full ROM no tenderness or swelling Tenderness to pressure over fingers of both hands, no palpable synovitis Knees full ROM mild tenderness to pressure, patellofemoral crepitus present Ankles full ROM no tenderness or swelling  Investigation: No additional findings.  Imaging: No results found.  Recent Labs: Lab Results  Component Value Date   WBC 5.4 04/24/2021   HGB 15.7 04/24/2021   PLT 151 04/24/2021   NA 140 04/24/2021   K 4.3 04/24/2021   CL 103 04/24/2021   CO2 27 04/24/2021   GLUCOSE 88 04/24/2021   BUN 12 04/24/2021   CREATININE 1.10 04/24/2021   BILITOT 0.5 04/24/2021   ALKPHOS 80 09/08/2020   AST 17 04/24/2021   ALT 22 04/24/2021   PROT 6.7 04/24/2021   ALBUMIN 4.5 09/08/2020   CALCIUM 9.0 04/24/2021   GFRAA 82 (L) 01/12/2013   QFTBGOLDPLUS NEGATIVE 04/24/2021    Speciality Comments: No specialty comments available.  Procedures:  No procedures performed Allergies: Allopurinol, Atorvastatin, Citalopram, Ezetimibe-simvastatin, Fluoxetine hcl, Methotrexate derivatives, Rosuvastatin, Venlafaxine, Seroquel [quetiapine fumarate], and Trazodone and nefazodone   Assessment / Plan:     Visit Diagnoses: Psoriatic arthritis ([place])  So far improvement to [place] has been not very impressive for joint disease skin is  significantly although not completely improved.  I think it is current amount of side effects and disruption on daily life would warrant change to alternate treatment if these do not improve more after another month on treatment.  For now continue Otezla 60 mg p.o. twice daily.  Malignant melanoma of skin of left thigh ([facility]) Skin cancer, basal cell  With his history of numerous skin cancers both malignant and benign recommend against TNF inhibitor treatment as a ideal choice.  High risk medication use  Discussed risks of possibly switching to IL-17 inhibitor such as Cosentyx including injection site reactions or increased risk of fungal infections and need for some laboratory monitoring.  Baseline labs reviewed he is already had normal screening tests. At this point he is overall agreeable if needed to change treatment for this provided information to review.   Orders: No orders of the defined types were placed in this encounter.  No orders of the defined types were placed in this encounter.    Follow-Up Instructions: Return in about 6 months (around 01/17/2022) for PsA on otezla f/u 36mo.   CCollier Salina MD  Note - This record has been created using [employer]  Chart creation errors have been sought, but may not always  have been located. Such creation errors do not reflect on  the standard of medical care.

## 2021-07-17 ENCOUNTER — Ambulatory Visit (INDEPENDENT_AMBULATORY_CARE_PROVIDER_SITE_OTHER): Payer: 59 | Admitting: Internal Medicine

## 2021-07-17 ENCOUNTER — Encounter: Payer: Self-pay | Admitting: Internal Medicine

## 2021-07-17 ENCOUNTER — Other Ambulatory Visit: Payer: Self-pay

## 2021-07-17 VITALS — BP 142/92 | HR 74 | Ht 71.0 in | Wt 196.8 lb

## 2021-07-17 DIAGNOSIS — C4372 Malignant melanoma of left lower limb, including hip: Secondary | ICD-10-CM

## 2021-07-17 DIAGNOSIS — Z79899 Other long term (current) drug therapy: Secondary | ICD-10-CM

## 2021-07-17 DIAGNOSIS — C4491 Basal cell carcinoma of skin, unspecified: Secondary | ICD-10-CM

## 2021-07-17 DIAGNOSIS — L4050 Arthropathic psoriasis, unspecified: Secondary | ICD-10-CM

## 2021-07-25 ENCOUNTER — Ambulatory Visit: Payer: 59 | Admitting: Internal Medicine

## 2021-08-24 ENCOUNTER — Encounter: Payer: Self-pay | Admitting: Internal Medicine

## 2021-08-27 ENCOUNTER — Other Ambulatory Visit: Payer: Self-pay | Admitting: Internal Medicine

## 2021-08-27 DIAGNOSIS — L4050 Arthropathic psoriasis, unspecified: Secondary | ICD-10-CM

## 2021-08-28 NOTE — Telephone Encounter (Signed)
Good observation, I addended note to correct 30 mg BID dosing. ?Raymond Long since then messaged he was going to try quitting the otezla for now due to side effects I expect will not be resuming this so does not need refill.

## 2021-08-28 NOTE — Telephone Encounter (Signed)
Next Visit: 01/17/2022 ? ?Last Visit: 07/17/2021 ? ?Last Fill: 05/12/2021 ? ?DX: Psoriatic arthritis  ? ?Current Dose per office note 07/17/2021: Otezla 60 mg p.o. twice daily. ? ?Labs: 04/24/2021 Labs look completely fine.  ? ?Okay to refill [place]?  ?

## 2021-08-31 ENCOUNTER — Other Ambulatory Visit: Payer: Self-pay | Admitting: Internal Medicine

## 2021-08-31 DIAGNOSIS — L4050 Arthropathic psoriasis, unspecified: Secondary | ICD-10-CM

## 2021-09-15 ENCOUNTER — Other Ambulatory Visit: Payer: Self-pay | Admitting: Adult Health

## 2021-09-15 DIAGNOSIS — E782 Mixed hyperlipidemia: Secondary | ICD-10-CM

## 2021-09-15 DIAGNOSIS — F339 Major depressive disorder, recurrent, unspecified: Secondary | ICD-10-CM

## 2021-09-15 DIAGNOSIS — I10 Essential (primary) hypertension: Secondary | ICD-10-CM

## 2021-09-15 DIAGNOSIS — F5101 Primary insomnia: Secondary | ICD-10-CM

## 2021-09-15 NOTE — Telephone Encounter (Signed)
Patient need to schedule a CPE for more refills. 

## 2021-09-29 ENCOUNTER — Telehealth: Payer: Self-pay | Admitting: Pharmacist

## 2021-09-29 NOTE — Telephone Encounter (Signed)
Received fax for [place] Nail prior auth renewal through [facility].   Key: [id number]  Per most recent MyChart message on 08/24/21, patient was holding Otezla x 30 days  MyChart message sent to patient  Knox Saliva, PharmD, MPH, BCPS, CPP Clinical Pharmacist (Rheumatology and Pulmonology)

## 2021-10-03 NOTE — Telephone Encounter (Signed)
Received notification from [place] regarding a prior authorization for [place]. Authorization has been APPROVED from 09/03/21 to 10/03/22.    Authorization # [id number]

## 2021-10-11 ENCOUNTER — Other Ambulatory Visit: Payer: Self-pay | Admitting: Adult Health

## 2021-10-11 DIAGNOSIS — I10 Essential (primary) hypertension: Secondary | ICD-10-CM

## 2021-10-11 DIAGNOSIS — E782 Mixed hyperlipidemia: Secondary | ICD-10-CM

## 2021-10-11 DIAGNOSIS — F5101 Primary insomnia: Secondary | ICD-10-CM

## 2021-10-11 DIAGNOSIS — F339 Major depressive disorder, recurrent, unspecified: Secondary | ICD-10-CM

## 2021-10-11 NOTE — Telephone Encounter (Signed)
Patient need to schedule an ov for more refills. 

## 2021-10-13 ENCOUNTER — Encounter: Payer: Self-pay | Admitting: Adult Health

## 2021-10-13 ENCOUNTER — Other Ambulatory Visit: Payer: Self-pay | Admitting: Adult Health

## 2021-10-13 DIAGNOSIS — I10 Essential (primary) hypertension: Secondary | ICD-10-CM

## 2021-10-13 DIAGNOSIS — F339 Major depressive disorder, recurrent, unspecified: Secondary | ICD-10-CM

## 2021-10-13 DIAGNOSIS — E782 Mixed hyperlipidemia: Secondary | ICD-10-CM

## 2021-10-13 DIAGNOSIS — F5101 Primary insomnia: Secondary | ICD-10-CM

## 2021-10-16 ENCOUNTER — Other Ambulatory Visit: Payer: Self-pay | Admitting: Adult Health

## 2021-10-16 DIAGNOSIS — I10 Essential (primary) hypertension: Secondary | ICD-10-CM

## 2021-10-16 DIAGNOSIS — F339 Major depressive disorder, recurrent, unspecified: Secondary | ICD-10-CM

## 2021-10-16 DIAGNOSIS — E782 Mixed hyperlipidemia: Secondary | ICD-10-CM

## 2021-10-16 DIAGNOSIS — F5101 Primary insomnia: Secondary | ICD-10-CM

## 2021-10-17 ENCOUNTER — Other Ambulatory Visit: Payer: Self-pay

## 2021-10-17 DIAGNOSIS — I10 Essential (primary) hypertension: Secondary | ICD-10-CM

## 2021-10-17 MED ORDER — AMLODIPINE BESYLATE 2.5 MG PO TABS: 2.5000 mg | ORAL_TABLET | Freq: Every day | ORAL | 0 refills | Status: DC

## 2021-10-27 ENCOUNTER — Ambulatory Visit (INDEPENDENT_AMBULATORY_CARE_PROVIDER_SITE_OTHER): Payer: 59 | Admitting: Adult Health

## 2021-10-27 ENCOUNTER — Encounter: Payer: Self-pay | Admitting: Adult Health

## 2021-10-27 VITALS — BP 140/90 | HR 60 | Temp 98.4°F | Ht 71.0 in | Wt 192.0 lb

## 2021-10-27 DIAGNOSIS — F5101 Primary insomnia: Secondary | ICD-10-CM | POA: Diagnosis not present

## 2021-10-27 DIAGNOSIS — M1A0710 Idiopathic chronic gout, right ankle and foot, without tophus (tophi): Secondary | ICD-10-CM

## 2021-10-27 DIAGNOSIS — Z125 Encounter for screening for malignant neoplasm of prostate: Secondary | ICD-10-CM | POA: Diagnosis not present

## 2021-10-27 DIAGNOSIS — Z0000 Encounter for general adult medical examination without abnormal findings: Secondary | ICD-10-CM | POA: Diagnosis not present

## 2021-10-27 DIAGNOSIS — L4050 Arthropathic psoriasis, unspecified: Secondary | ICD-10-CM

## 2021-10-27 DIAGNOSIS — E7801 Familial hypercholesterolemia: Secondary | ICD-10-CM | POA: Diagnosis not present

## 2021-10-27 DIAGNOSIS — I10 Essential (primary) hypertension: Secondary | ICD-10-CM

## 2021-10-27 LAB — COMPREHENSIVE METABOLIC PANEL
ALT: 19 U/L (ref 0–53)
AST: 17 U/L (ref 0–37)
Albumin: 4.6 g/dL (ref 3.5–5.2)
Alkaline Phosphatase: 77 U/L (ref 39–117)
BUN: 15 mg/dL (ref 6–23)
CO2: 28 mEq/L (ref 19–32)
Calcium: 9.5 mg/dL (ref 8.4–10.5)
Chloride: 100 mEq/L (ref 96–112)
Creatinine, Ser: 1.23 mg/dL (ref 0.40–1.50)
GFR: 63.4 mL/min (ref >60.00)
Glucose, Bld: 78 mg/dL (ref 70–99)
Potassium: 4.3 mEq/L (ref 3.5–5.1)
Sodium: 138 mEq/L (ref 135–145)
Total Bilirubin: 0.9 mg/dL (ref 0.2–1.2)
Total Protein: 7.1 g/dL (ref 6.0–8.3)

## 2021-10-27 LAB — LIPID PANEL
Cholesterol: 301 mg/dL — ABNORMAL HIGH (ref 0–200)
HDL: 38.6 mg/dL — ABNORMAL LOW (ref >39.00)
LDL Cholesterol: 232 mg/dL — ABNORMAL HIGH (ref 0–99)
NonHDL: 262.67
Total CHOL/HDL Ratio: 8
Triglycerides: 151 mg/dL — ABNORMAL HIGH (ref 0.0–149.0)
VLDL: 30.2 mg/dL (ref 0.0–40.0)

## 2021-10-27 LAB — HEMOGLOBIN A1C: Hgb A1c MFr Bld: 5.4 % (ref 4.6–6.5)

## 2021-10-27 LAB — CBC WITH DIFFERENTIAL/PLATELET
Basophils Absolute: 0 10*3/uL (ref 0.0–0.1)
Basophils Relative: 0.4 % (ref 0.0–3.0)
Eosinophils Absolute: 0.1 10*3/uL (ref 0.0–0.7)
Eosinophils Relative: 2.6 % (ref 0.0–5.0)
HCT: 43.6 % (ref 39.0–52.0)
Hemoglobin: 14.9 g/dL (ref 13.0–17.0)
Lymphocytes Relative: 37 % (ref 12.0–46.0)
Lymphs Abs: 2 10*3/uL (ref 0.7–4.0)
MCHC: 34.1 g/dL (ref 30.0–36.0)
MCV: 91.9 fl (ref 78.0–100.0)
Monocytes Absolute: 0.6 10*3/uL (ref 0.1–1.0)
Monocytes Relative: 10 % (ref 3.0–12.0)
Neutro Abs: 2.8 10*3/uL (ref 1.4–7.7)
Neutrophils Relative %: 50 % (ref 43.0–77.0)
Platelets: 157 10*3/uL (ref 150.0–400.0)
RBC: 4.74 Mil/uL (ref 4.22–5.81)
RDW: 14 % (ref 11.5–15.5)
WBC: 5.5 10*3/uL (ref 4.0–10.5)

## 2021-10-27 LAB — URIC ACID: Uric Acid, Serum: 8.9 mg/dL — ABNORMAL HIGH (ref 4.0–7.8)

## 2021-10-27 LAB — VITAMIN B12: Vitamin B-12: 289 pg/mL (ref 211–911)

## 2021-10-27 LAB — TSH: TSH: 2.81 u[IU]/mL (ref 0.35–5.50)

## 2021-10-27 LAB — PSA: PSA: 1.57 ng/mL (ref 0.10–4.00)

## 2021-10-27 MED ORDER — MIRTAZAPINE 30 MG PO TABS: ORAL_TABLET | ORAL | 1 refills | Status: DC

## 2021-10-27 MED ORDER — AMLODIPINE BESYLATE 5 MG PO TABS: 5.0000 mg | ORAL_TABLET | Freq: Every day | ORAL | 1 refills | Status: DC

## 2021-10-31 ENCOUNTER — Encounter: Payer: Self-pay | Admitting: Adult Health

## 2021-11-02 NOTE — Telephone Encounter (Signed)
Please advise 

## 2021-11-06 ENCOUNTER — Encounter: Payer: Self-pay | Admitting: Plastic Surgery

## 2021-11-06 ENCOUNTER — Ambulatory Visit (INDEPENDENT_AMBULATORY_CARE_PROVIDER_SITE_OTHER): Payer: 59 | Admitting: Plastic Surgery

## 2021-11-06 VITALS — BP 148/94 | HR 61 | Ht 71.0 in | Wt 193.0 lb

## 2021-11-06 DIAGNOSIS — C44719 Basal cell carcinoma of skin of left lower limb, including hip: Secondary | ICD-10-CM

## 2021-11-06 DIAGNOSIS — C4491 Basal cell carcinoma of skin, unspecified: Secondary | ICD-10-CM

## 2021-11-06 NOTE — Progress Notes (Signed)
Referring Provider Shirline Frees, NP [address] [place],  [place] [address]   CC:  Left lower extremity basal cell carcinoma.   Raymond Long is an 61 y.o. male.  HPI: Patient is a 62 year old with a history of left lower extremity basal cell carcinoma.  Lesion was diagnosed by dermatology.  Tissue and he was referred subsequently.  It has been present for greater than 2 years.    Allergies  Allergen Reactions   Allopurinol     Itching, redness, scaling to his skin and swelling in the ankle   Atorvastatin    Citalopram    Ezetimibe-Simvastatin     REACTION: up cpk   Fluoxetine Hcl    Methotrexate Derivatives Other (See Comments)   Rosuvastatin     REACTION: up cpk   Venlafaxine    Seroquel [Quetiapine Fumarate] Palpitations    Racing thoughts and restless sleep   Trazodone And Nefazodone Palpitations    Outpatient Encounter Medications as of 11/06/2021  Medication Sig   amLODipine (NORVASC) 5 MG tablet Take 1 tablet (5 mg total) by mouth daily.   clindamycin (CLEOCIN T) 1 % external solution Apply 1 Application topically daily.   colchicine 0.6 MG tablet Take 1 tablet 1 to 2 times daily as needed for gout flares   fluorouracil (EFUDEX) 5 % cream SMARTSIG:1 Topical 4 Times Daily   hydrOXYzine (ATARAX/VISTARIL) 25 MG tablet Take 25 mg by mouth at bedtime.   imiquimod (ALDARA) 5 % cream SMARTSIG:1 Topical Daily PRN   mirtazapine (REMERON) 30 MG tablet TAKE 1 TABLET BY MOUTH EVERYDAY AT BEDTIME   [DISCONTINUED] benzonatate (TESSALON) 200 MG capsule Take 1 capsule (200 mg total) by mouth 3 (three) times daily as needed for cough.   No facility-administered encounter medications on file as of 11/06/2021.     Past Medical History:  Diagnosis Date   Arthralgia of wrist, left    Arthritis    psoriatic arthritis   Cancer ([facility])    Skin   Erectile dysfunction    Gout    Foot   Hyperlipidemia    Hypertension    Insomnia    PONV (postoperative nausea and  vomiting)     Past Surgical History:  Procedure Laterality Date   COLONOSCOPY     @ 49 due to rectal symptoms   HERNIA REPAIR  2008   Bilateral   MELANOMA EXCISION  12/13/2015   Located behind right ear   MELANOMA EXCISION WITH SENTINEL LYMPH NODE BIOPSY Left 01/15/2013   Procedure: EXCISION LEFT LEG MELANOMA WITH LEFT INGUINAL SENTINEL NODE BIOPSY;  Surgeon: Emelia Loron, MD;  Location: [facility];  Service: General;  Laterality: Left;   TONSILLECTOMY  age 42   TOTAL HIP ARTHROPLASTY      Family History  Problem Relation Age of Onset   Heart attack Mother        < 39   Dementia Mother    Heart attack Father 27   COPD Sister    Breast cancer Sister    Colon cancer Neg Hx    Colon polyps Neg Hx    Esophageal cancer Neg Hx    Rectal cancer Neg Hx    Stomach cancer Neg Hx     Social History   Social History Narrative   He works in Teacher, English as a foreign language   No kids       Likes to be outside and hike.  Review of Systems General: Denies fevers, chills, weight loss CV: Denies chest pain, shortness of breath, palpitations   Physical Exam    11/06/2021   11:17 AM 10/27/2021    1:04 PM 10/27/2021    1:01 PM  Vitals with BMI  Height 5\' 11"   5\' 11"   Weight 193 lbs  192 lbs  BMI 26.93  26.79  Systolic 148 140 161  Diastolic 94 90 100  Pulse 61  60    General:  No acute distress,  Alert and oriented, Non-Toxic, Normal speech and affect Extremity: 2.8 x 2.8 cm left lower extremity lesion.  Ulcerated.  Assessment/Plan Basal cell left lower extremity.  Recommend excision with likely skin graft substitute or dressing.  Skin graft substitute will be needed if any tendon or periosteum is exposed.  May be also be beneficial contour.    Raymond Long 11/06/2021, 11:26 AM

## 2021-11-08 ENCOUNTER — Other Ambulatory Visit: Payer: Self-pay | Admitting: Adult Health

## 2021-11-08 DIAGNOSIS — I10 Essential (primary) hypertension: Secondary | ICD-10-CM

## 2021-11-15 ENCOUNTER — Encounter: Payer: Self-pay | Admitting: Adult Health

## 2021-11-15 NOTE — Telephone Encounter (Signed)
Please advise 

## 2021-11-27 ENCOUNTER — Encounter: Payer: Self-pay | Admitting: Adult Health

## 2021-11-29 ENCOUNTER — Telehealth: Payer: Self-pay

## 2021-11-29 NOTE — Telephone Encounter (Signed)
Per Max DLK#[id number] No PA required [place], [place], [place], [place], [place], [id number], [id number]

## 2021-12-05 ENCOUNTER — Other Ambulatory Visit: Payer: Self-pay | Admitting: Adult Health

## 2021-12-05 MED ORDER — AMLODIPINE BESYLATE 10 MG PO TABS: 10.0000 mg | ORAL_TABLET | Freq: Every day | ORAL | 3 refills | Status: DC

## 2021-12-06 ENCOUNTER — Telehealth: Payer: Self-pay | Admitting: Plastic Surgery

## 2021-12-06 NOTE — Telephone Encounter (Signed)
TRLVM NEED TO [place] LEFT MESSAGE 07.27.23 AND 08.02.23

## 2021-12-11 ENCOUNTER — Encounter: Payer: Self-pay | Admitting: Adult Health

## 2021-12-11 ENCOUNTER — Encounter: Payer: Self-pay | Admitting: Internal Medicine

## 2021-12-11 DIAGNOSIS — M1A0710 Idiopathic chronic gout, right ankle and foot, without tophus (tophi): Secondary | ICD-10-CM

## 2021-12-12 MED ORDER — PREDNISONE 10 MG PO TABS: ORAL_TABLET | ORAL | 0 refills | Status: AC

## 2022-01-10 NOTE — Progress Notes (Signed)
 Office Visit Note  Patient: Raymond Long             Date of Birth: 07/11/1960           MRN: [id number]             PCP: Nafziger, Cory, NP Referring: Nafziger, Cory, NP Visit Date: 01/17/2022   Subjective:   History of Present Illness: Raymond Long is a 61 y.o. male here for follow up for psoriatic arthritis and gout. Skin symptoms have been pretty stable since last visit while off otezla. Joints are a problem he had flare ups in the left great for and right ankle with severe swelling and pain lasting for days. He took high amounts of aleve with benefit about 4-6 extra strength capsules daily. But the left toe flare did not improve we called in prednisone taper that improved within 48 hours.  Previous HPI 07/17/2021 Raymond Long is a 61 y.o. male here for follow up for psoriatic arthritis on otezla started recently and was suffering some increased GI symptoms from this.  His original amount GI side effects improved though still has somewhat loose stool on a daily basis.  However started to develop pounding or throbbing type headaches particularly in the mornings with this medication and feels fatigued.  He has noticed partial improvement of his skin in multiple areas including his entire torso and on his shins there is still one bad lesion on his left leg.  Joint pain has not improved but she still has a lot of pain and stiffness though not seeing any significant swelling.     Previous HPI 04/24/21 Raymond Long is a 60 y.o. male here for follow up for seronegative arthritis and gout off maintenance treatment due to suspected mucositis or pneumonitis with trial of methotrexate. He notices increasing pain and stiffness in his fingers without much swelling. Skin rashes without much change some increased peeling at scalp and ears without much erythema. He sustained a few bites from his dog to both hands during a recent trip for family holiday gathering in [place]. He has  been applying neosporin to the affected areas mildly painful at this point largest cut on the right hand.   Previous HPI 10/26/20 Raymond Long is a 60 y.o. male here for follow up for his chronic joint pain of multiple sites and skin rashes.  He has continued on the gluten-free diet and thinks there is definitely some improvement with the skin disease while doing this. Laboratory testing at his initial visit showed a uric acid of 9.2 but was otherwise negative for acute inflammatory markers or rheumatoid arthritis serology or HLA-B27 allele.  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     Review of Systems  Constitutional:  Positive for fatigue.  HENT:  Positive for mouth dryness. Negative for mouth sores.   Eyes:  Positive for dryness.  Respiratory:  Positive for shortness of breath.   Cardiovascular:  Positive for chest pain and palpitations.  Gastrointestinal:  Positive for diarrhea. Negative for blood in stool and constipation.  Endocrine: Negative for increased urination.  Genitourinary:  Negative for involuntary urination.  Musculoskeletal:  Positive for joint pain, joint pain, joint swelling, myalgias, morning stiffness, muscle tenderness and myalgias. Negative for gait problem and muscle weakness.  Skin:  Positive for rash. Negative for color change, hair loss   and sensitivity to sunlight.  Allergic/Immunologic: Negative for susceptible to infections.  Neurological:  Positive for headaches. Negative for dizziness.  Hematological:  Negative for swollen glands.  Psychiatric/Behavioral:  Negative for depressed mood and sleep disturbance. The patient is not nervous/anxious.     PMFS History:  Patient Active Problem List   Diagnosis Date Noted   Dog bite 04/24/2021   High risk medication use 04/24/2021   Cough 12/26/2020   Psoriatic arthritis (HCC)  10/26/2020   Rash and other nonspecific skin eruption 10/06/2020   History of revision of total replacement of left hip joint 01/05/2019   Insomnia 05/29/2017   OSA (obstructive sleep apnea) 05/29/2017   Gastroesophageal reflux with apnea 05/29/2017   Generalized headache 01/25/2017   Testicular pain, left 04/26/2016   Routine general medical examination at a health care facility 10/12/2015   Essential hypertension 09/19/2015   Malignant melanoma of skin of left thigh (HCC) 04/06/2013   Skin cancer, basal cell 10/28/2012   Fasting hyperglycemia 10/28/2012   Other abnormal glucose 10/23/2012   GOUT 10/11/2008   Pain in joint 06/15/2008   Hyperlipidemia 04/23/2008   CARPAL TUNNEL SYNDROME 04/23/2008   INGUINAL PAIN, LEFT 11/20/2006    Past Medical History:  Diagnosis Date   Arthralgia of wrist, left    Arthritis    psoriatic arthritis   Cancer (HCC)    Skin   Erectile dysfunction    Gout    Foot   Hyperlipidemia    Hypertension    Insomnia    PONV (postoperative nausea and vomiting)     Family History  Problem Relation Age of Onset   Heart attack Mother        < 65   Dementia Mother    Heart attack Father 59   COPD Sister    Breast cancer Sister    Colon cancer Neg Hx    Colon polyps Neg Hx    Esophageal cancer Neg Hx    Rectal cancer Neg Hx    Stomach cancer Neg Hx    Past Surgical History:  Procedure Laterality Date   COLONOSCOPY     @ 49 due to rectal symptoms   HERNIA REPAIR  2008   Bilateral   MELANOMA EXCISION  12/13/2015   Located behind right ear   MELANOMA EXCISION WITH SENTINEL LYMPH NODE BIOPSY Left 01/15/2013   Procedure: EXCISION LEFT LEG MELANOMA WITH LEFT INGUINAL SENTINEL NODE BIOPSY;  Surgeon: Matthew Wakefield, MD;  Location: [facility];  Service: General;  Laterality: Left;   TONSILLECTOMY  age 11   TOTAL HIP ARTHROPLASTY     Social History   Social History Narrative   He works in landscaping    Domestic Partner   No  kids       Likes to be outside and hike.          Immunization History  Administered Date(s) Administered   Influenza,inj,Quad PF,6+ Mos 01/25/2017, 04/09/2019   Moderna Sars-Covid-2 Vaccination 07/21/2019, 08/18/2019   Tdap 04/09/2019   Zoster Recombinat (Shingrix) 06/16/2021     Objective: Vital Signs: BP (!) 154/87 (BP Location: Right Arm, Patient Position: Sitting, Cuff Size: Normal)   Pulse 64   Resp 16   Ht 5' 11" (1.803 m)   Wt 193 lb 6.4 oz (87.7 kg)   BMI 26.97 kg/m    Physical Exam Cardiovascular:     Rate and Rhythm: Normal rate and regular rhythm.  Skin:    General: Skin is warm and   dry.     Findings: Rash present.     Comments: Stable rashes on back, leg lesion with no induration  Neurological:     Mental Status: He is alert.  Psychiatric:        Mood and Affect: Mood normal.     Musculoskeletal Exam:  Shoulders full ROM no tenderness or swelling Elbows full ROM no tenderness or swelling Wrists full ROM no tenderness or swelling Fingers full ROM, no swelling, no focal tenderness to pressure Knees full ROM no tenderness or swelling Ankles full ROM no tenderness or swelling Left 1st MTP chronic appearing enlarged, mlid tenderness, no palpable effusion or warmth   Investigation: No additional findings.  Imaging: No results found.  Recent Labs: Lab Results  Component Value Date   WBC 5.5 10/27/2021   HGB 14.9 10/27/2021   PLT 157.0 10/27/2021   NA 138 10/27/2021   K 4.3 10/27/2021   CL 100 10/27/2021   CO2 28 10/27/2021   GLUCOSE 78 10/27/2021   BUN 15 10/27/2021   CREATININE 1.23 10/27/2021   BILITOT 0.9 10/27/2021   ALKPHOS 77 10/27/2021   AST 17 10/27/2021   ALT 19 10/27/2021   PROT 7.1 10/27/2021   ALBUMIN 4.6 10/27/2021   CALCIUM 9.5 10/27/2021   GFRAA 82 (L) 01/12/2013   QFTBGOLDPLUS NEGATIVE 04/24/2021    Speciality Comments: No specialty comments available.  Procedures:  No procedures performed Allergies: Allopurinol,  Atorvastatin, Citalopram, Ezetimibe-simvastatin, Fluoxetine hcl, Methotrexate derivatives, Otezla [apremilast], Rosuvastatin, Venlafaxine, Seroquel [quetiapine fumarate], and Trazodone and nefazodone   Assessment / Plan:     Visit Diagnoses: Chronic gout of multiple sites, unspecified cause - Plan: probenecid (BENEMID) 500 MG tablet, colchicine 0.6 MG tablet, predniSONE (DELTASONE) 10 MG tablet, Uric acid, BASIC METABOLIC PANEL WITH GFR  Multiple gout flares since our last visit right ankle and left first MTP most affected.  He previously did not tolerate allopurinol due to reaction he was on Uloric with a lot of GI intolerance.  Most recent uric acid of 8.9 in June is above goal of 6.  Discussed basic information on low purine diet and uricosuric's.  Discussed gout pathophysiology and management strategy for lowering uric acid as well as acute inflammation treatment. I recommend we try probenecid for urate lowering therapy he has good renal function and no contraindication.  We will start probenecid 500 mg twice daily.  Future orders placed to repeat his uric acid and a basic metabolic panel after he takes the probenecid for 1 month.  Discussed risks of medications including increased urinary frequency or possible effect on renal function requiring monitoring after he starts. New prescription for colchicine for treating flares as needed with his amount of GI problems or medications do not recommend daily maintenance.  He is also anticipating upcoming travel we will write new prednisone prescription for on hand if needed with a repeat flareup.  Psoriatic arthritis ([place])  He has discontinued the [facility] with the GI symptom intolerance being significant so far skin symptoms remain pretty stable.  His joint pains have been primarily dominated with the gout flares he is not experiencing a ton of inflammation outside of these episodes.  Most recent dermatology follow-up with a lesion from the left neck excised  no specific change in maintenance.    Orders: Orders Placed This Encounter  Procedures   Uric acid   BASIC METABOLIC PANEL WITH GFR   Meds ordered this encounter  Medications   probenecid (BENEMID) 500 MG tablet  Sig: Take 1 tablet (500 mg total) by mouth 2 (two) times daily.    Dispense:  60 tablet    Refill:  1   colchicine 0.6 MG tablet    Sig: Take 1 tablet 1 to 2 times daily as needed for gout flares    Dispense:  30 tablet    Refill:  2   predniSONE (DELTASONE) 10 MG tablet    Sig: Take as needed for gout flare:4 tabs daily for 2 days, then 3 tabs daily, then 2 tabs daily, then 1 tablets daily for total of 8 days    Dispense:  20 tablet    Refill:  0     Follow-Up Instructions: Return in about 3 months (around 04/18/2022) for Gout probenecid start f/u 30mo.   CCollier Salina MD  Note - This record has been created using [employer]  Chart creation errors have been sought, but may not always  have been located. Such creation errors do not reflect on  the standard of medical care.

## 2022-01-17 ENCOUNTER — Ambulatory Visit: Payer: 59 | Attending: Internal Medicine | Admitting: Internal Medicine

## 2022-01-17 ENCOUNTER — Encounter: Payer: Self-pay | Admitting: Internal Medicine

## 2022-01-17 VITALS — BP 154/87 | HR 64 | Resp 16 | Ht 71.0 in | Wt 193.4 lb

## 2022-01-17 DIAGNOSIS — L4050 Arthropathic psoriasis, unspecified: Secondary | ICD-10-CM | POA: Diagnosis not present

## 2022-01-17 DIAGNOSIS — M1A09X0 Idiopathic chronic gout, multiple sites, without tophus (tophi): Secondary | ICD-10-CM | POA: Diagnosis not present

## 2022-01-17 DIAGNOSIS — Z79899 Other long term (current) drug therapy: Secondary | ICD-10-CM

## 2022-01-17 DIAGNOSIS — C4372 Malignant melanoma of left lower limb, including hip: Secondary | ICD-10-CM

## 2022-01-17 MED ORDER — PREDNISONE 10 MG PO TABS: ORAL_TABLET | ORAL | 0 refills | Status: DC

## 2022-01-17 MED ORDER — COLCHICINE 0.6 MG PO TABS: ORAL_TABLET | ORAL | 2 refills | Status: DC

## 2022-01-17 MED ORDER — PROBENECID 500 MG PO TABS: 500.0000 mg | ORAL_TABLET | Freq: Two times a day (BID) | ORAL | 1 refills | Status: DC

## 2022-01-26 ENCOUNTER — Other Ambulatory Visit: Payer: Self-pay | Admitting: Internal Medicine

## 2022-01-26 DIAGNOSIS — M1A09X0 Idiopathic chronic gout, multiple sites, without tophus (tophi): Secondary | ICD-10-CM

## 2022-02-13 ENCOUNTER — Ambulatory Visit (HOSPITAL_BASED_OUTPATIENT_CLINIC_OR_DEPARTMENT_OTHER): Payer: 59 | Admitting: Internal Medicine

## 2022-02-16 ENCOUNTER — Other Ambulatory Visit: Payer: Self-pay | Admitting: Internal Medicine

## 2022-02-16 DIAGNOSIS — M1A09X0 Idiopathic chronic gout, multiple sites, without tophus (tophi): Secondary | ICD-10-CM

## 2022-03-19 ENCOUNTER — Other Ambulatory Visit: Payer: Self-pay | Admitting: Internal Medicine

## 2022-03-19 ENCOUNTER — Telehealth (HOSPITAL_BASED_OUTPATIENT_CLINIC_OR_DEPARTMENT_OTHER): Payer: Self-pay | Admitting: Internal Medicine

## 2022-03-19 ENCOUNTER — Encounter: Payer: Self-pay | Admitting: Internal Medicine

## 2022-03-19 ENCOUNTER — Ambulatory Visit: Payer: 59 | Attending: Internal Medicine | Admitting: Internal Medicine

## 2022-03-19 VITALS — BP 161/97 | Ht 71.0 in | Wt 190.0 lb

## 2022-03-19 DIAGNOSIS — I10 Essential (primary) hypertension: Secondary | ICD-10-CM

## 2022-03-19 DIAGNOSIS — M1A09X0 Idiopathic chronic gout, multiple sites, without tophus (tophi): Secondary | ICD-10-CM

## 2022-03-19 DIAGNOSIS — M609 Myositis, unspecified: Secondary | ICD-10-CM | POA: Diagnosis not present

## 2022-03-19 DIAGNOSIS — L4050 Arthropathic psoriasis, unspecified: Secondary | ICD-10-CM

## 2022-03-19 DIAGNOSIS — T466X5D Adverse effect of antihyperlipidemic and antiarteriosclerotic drugs, subsequent encounter: Secondary | ICD-10-CM

## 2022-03-19 DIAGNOSIS — E7849 Other hyperlipidemia: Secondary | ICD-10-CM

## 2022-03-19 MED ORDER — TELMISARTAN 80 MG PO TABS: 80.0000 mg | ORAL_TABLET | Freq: Every day | ORAL | 3 refills | Status: DC

## 2022-03-19 NOTE — Telephone Encounter (Signed)
Patient stated he would like to be reached on the [phone] number for today's video visit.

## 2022-03-19 NOTE — Telephone Encounter (Signed)
Next Visit: 04/19/2022  Last Visit: 01/17/2022  Last Fill: 01/17/2022  DX: Chronic gout of multiple sites, unspecified cause    Current Dose per office note 01/17/2022: probenecid (BENEMID) 500 MG tablet  1 tablet (500 mg total) by mouth 2 (two) times daily.   Labs: 10/27/2021 All other labs are stable   Okay to refill Probenecid?

## 2022-03-19 NOTE — Patient Instructions (Addendum)
Medication Instructions:  START Telmisartan 80 mg once daily   *If you need a refill on your cardiac medications before your next appointment, please call your pharmacy*   Lab Work: BMET in 2 weeks.   If you have labs (blood work) drawn today and your tests are completely normal, you will receive your results only by: [place] (if you have MyChart) OR A paper copy in the mail If you have any lab test that is abnormal or we need to change your treatment, we will call you to review the results.  Follow-Up: At [facility], you and your health needs are our priority.  As part of our continuing mission to provide you with exceptional heart care, we have created designated Provider Care Teams.  These Care Teams include your primary Cardiologist (physician) and Advanced Practice Providers (APPs -  Physician Assistants and Nurse Practitioners) who all work together to provide you with the care you need, when you need it.  We recommend signing up for the patient portal called "MyChart".  Sign up information is provided on this After Visit Summary.  MyChart is used to connect with patients for Virtual Visits (Telemedicine).  Patients are able to view lab/test results, encounter notes, upcoming appointments, etc.  Non-urgent messages can be sent to your provider as well.   To learn more about what you can do with MyChart, go to [email or web address].    Your next appointment:   4-6 month(s)  The format for your next appointment:   In Person  Provider:   Dr.Hilty     *We will let you know about Leqvio- if covered* *Please send your blood pressure readings in 2-3 weeks*

## 2022-03-20 ENCOUNTER — Encounter: Payer: Self-pay | Admitting: Internal Medicine

## 2022-03-20 NOTE — Progress Notes (Addendum)
Virtual Visit via Video Note   Because of Raymond Long's co-morbid illnesses, he is at least at moderate risk for complications without adequate follow up.  This format is felt to be most appropriate for this patient at this time.  All issues noted in this document were discussed and addressed.  A limited physical exam was performed with this format.  Please refer to the patient's chart for his consent to telehealth for [facility].      Date:  03/20/2022   ID:  Raymond Long, DOB 06-29-1960, MRN [id number] The patient was identified using 2 identifiers.  Evaluation Performed:  Follow-Up Visit  Patient Location:  [address] [place] [address]  Provider location:   [address] [place] [address] [place], [place] [address]  PCP:  Dorothyann Peng, NP  Cardiologist:  None Electrophysiologist:  None   Chief Complaint:  follow-up dyslipidemia  History of Present Illness:    Raymond Long is a 61 y.o. male who presents via audio/video conferencing for a telehealth visit today.  This is a pleasant 61 year old male kindly referred for evaluation management of dyslipidemia.  He reports longstanding issues with cholesterol that has been difficult to control due to side effects with the statins.  He has previously tried atorvastatin, rosuvastatin and Vytorin, all of which caused myalgias and increase in his CKs.  He does report family history of heart disease in both parents in their 78s.  His untreated cholesterol has been well over 190 and is up to the 200s in the past.  More recently his total cholesterol was 256, triglycerides 147, HDL 28 and LDL 199.  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  He also has a history of psoriatic arthritis on treatment and this is highly significant for an at risk factor for heart disease, typically conferring up to 7 times the risk of heart disease compared to an on psoriatic arthritis patient.  03/19/2022  Raymond Long is seen today via video follow-up for his lipids.  I saw him last in the summer 2022 at which time he had recommended a PCSK9 inhibitor.  Ultimately he went on to a biologic therapy for psoriatic arthritis and did not pursue PCSK9 inhibitor therapy.  He apparently had some side effects with this.  Subsequently he wanted to revisit lipid-lowering therapies.  He did have recent repeat lipids in June 2023 which showed total cholesterol 301, triglycerides 151, HDL 36 and LDL 232.  We discussed about options today including antibody PCSK9 inhibitor therapy and Leqvio and he is favoring to try to get onto [facility] therapy.  Of note, blood pressure at home today was elevated at 161/97.  He says his blood pressures have been generally running high in the 536I and 680H systolic.  He is on amlodipine 10 mg daily.  Prior CV studies:   The following studies were reviewed today:  labwork  PMHx:  Past Medical History:  Diagnosis Date   Arthralgia of wrist, left    Arthritis    psoriatic arthritis   Cancer ([place])    Skin   Erectile dysfunction    Gout    Foot   Hyperlipidemia    Hypertension    Insomnia    PONV (postoperative nausea and vomiting)     Past Surgical History:  Procedure Laterality Date   COLONOSCOPY     @ 49 due to rectal symptoms   HERNIA REPAIR  2008   Bilateral   MELANOMA EXCISION  12/13/2015   Located behind right ear   MELANOMA EXCISION WITH SENTINEL LYMPH NODE BIOPSY Left 01/15/2013   Procedure: EXCISION LEFT LEG MELANOMA WITH LEFT INGUINAL SENTINEL NODE BIOPSY;  Surgeon: Rolm Bookbinder, MD;  Location: [facility];  Service: General;  Laterality: Left;   TONSILLECTOMY  age 83   TOTAL HIP ARTHROPLASTY      FAMHx:  Family History  Problem Relation Age of Onset   Heart attack Mother        < 84   Dementia Mother    Heart attack Father 39   COPD Sister    Breast cancer Sister    Colon cancer Neg Hx    Colon polyps Neg Hx    Esophageal cancer Neg  Hx    Rectal cancer Neg Hx    Stomach cancer Neg Hx     SOCHx:   reports that he quit smoking about 21 years ago. His smoking use included cigarettes. He has a 40.00 pack-year smoking history. He has never been exposed to tobacco smoke. He has never used smokeless tobacco. He reports that he does not drink alcohol and does not use drugs.  ALLERGIES:  Allergies  Allergen Reactions   Allopurinol     Itching, redness, scaling to his skin and swelling in the ankle   Atorvastatin    Citalopram    Ezetimibe-Simvastatin     REACTION: up cpk   Fluoxetine Hcl    Methotrexate Derivatives Other (See Comments)   [place] Nail [Apremilast]    Rosuvastatin     REACTION: up cpk   Venlafaxine    Seroquel [Quetiapine Fumarate] Palpitations    Racing thoughts and restless sleep   Trazodone And Nefazodone Palpitations    MEDS:  Current Meds  Medication Sig   amLODipine (NORVASC) 10 MG tablet Take 1 tablet (10 mg total) by mouth daily.   clindamycin (CLEOCIN T) 1 % external solution Apply 1 Application topically daily.   colchicine 0.6 MG tablet Take 1 tablet 1 to 2 times daily as needed for gout flares   fluorouracil (EFUDEX) 5 % cream SMARTSIG:1 Topical 4 Times Daily   hydrOXYzine (ATARAX/VISTARIL) 25 MG tablet Take 25 mg by mouth at bedtime.   imiquimod (ALDARA) 5 % cream SMARTSIG:1 Topical Daily PRN   mirtazapine (REMERON) 30 MG tablet TAKE 1 TABLET BY MOUTH EVERYDAY AT BEDTIME   predniSONE (DELTASONE) 10 MG tablet Take as needed for gout flare:4 tabs daily for 2 days, then 3 tabs daily, then 2 tabs daily, then 1 tablets daily for total of 8 days   probenecid (BENEMID) 500 MG tablet TAKE 1 TABLET BY MOUTH TWICE A DAY   telmisartan (MICARDIS) 80 MG tablet Take 1 tablet (80 mg total) by mouth daily.     ROS: Pertinent items noted in HPI and remainder of comprehensive ROS otherwise negative.  Labs/Other Tests and Data Reviewed:    Recent Labs: 10/27/2021: ALT 19; BUN 15; Creatinine, Ser 1.23;  Hemoglobin 14.9; Platelets 157.0; Potassium 4.3; Sodium 138; TSH 2.81   Recent Lipid Panel Lab Results  Component Value Date/Time   CHOL 301 (H) 10/27/2021 01:33 PM   CHOL 251 (H) 11/02/2014 10:26 AM   TRIG 151.0 (H) 10/27/2021 01:33 PM   TRIG 186 (H) 11/02/2014 10:26 AM   TRIG 89 04/23/2006 09:59 AM   HDL 38.60 (L) 10/27/2021 01:33 PM   HDL 29 (L) 11/02/2014 10:26 AM   CHOLHDL 8 10/27/2021 01:33 PM   LDLCALC 232 (H) 10/27/2021 01:33 PM  LDLCALC 185 (H) 11/02/2014 10:26 AM   LDLDIRECT 176.9 06/09/2008 09:58 AM    Wt Readings from Last 3 Encounters:  03/19/22 190 lb (86.2 kg)  01/17/22 193 lb 6.4 oz (87.7 kg)  11/06/21 193 lb (87.5 kg)     Exam:    Vital Signs:  BP (!) 161/97 (BP Location: Left Arm, Patient Position: Sitting)   Ht '5\' 11"'$  (1.803 m)   Wt 190 lb (86.2 kg)   BMI 26.50 kg/m    General appearance: alert and no distress Lungs: No wheezing Abdomen: Normal weight Neurologic: Grossly normal  ASSESSMENT & PLAN:    Probable familial hyperlipidemia, based on Simon Broome criteria Longstanding history of LDL cholesterol greater than 190 Premature coronary disease in both parents in their 60s Statin intolerance-myalgias/myositis Psoriatic arthritis Uncontrolled hypertension  Raymond Long has probable familial hyperlipidemia with LDL much greater than 190.  We discussed treatment options and he would like to pursue Leqvio because of low risk of potential side effects.  Will do a benefits determination based on his insurance.  This medicine could be expensive even with commercial insurance.  If approved he will get his initial and 90-day injections and we will plan a repeat lipid in about 4 to 6 months with follow-up afterward.  Also, blood pressure was noted to be elevated today and he says recently has been running higher.  He would like additional therapy for this.  We will plan to start telmisartan 80 mg daily.  Repeat metabolic profile in about 2 weeks.  Patient  Risk:   After full review of this patients clinical status, I feel that they are at least moderate risk at this time.  Time:   Today, I have spent 25 minutes with the patient with telehealth technology discussing dyslipidemia.     Medication Adjustments/Labs and Tests Ordered: Current medicines are reviewed at length with the patient today.  Concerns regarding medicines are outlined above.   Tests Ordered: Orders Placed This Encounter  Procedures   Basic metabolic panel    Medication Changes: Meds ordered this encounter  Medications   telmisartan (MICARDIS) 80 MG tablet    Sig: Take 1 tablet (80 mg total) by mouth daily.    Dispense:  90 tablet    Refill:  3    Disposition:  in 6 month(s)  Pixie Casino, MD, [facility], [place] Director of the Advanced Lipid Disorders &  Cardiovascular Risk Reduction Clinic Diplomate of the American Board of Clinical Lipidology Attending Cardiologist  Direct Dial: [phone]  Fax: [phone]  Website:  [email or web address]  Pixie Casino, MD  03/20/2022 4:10 PM

## 2022-03-20 NOTE — Telephone Encounter (Signed)
Patient was called and completed video visit.   Thanks!

## 2022-03-28 ENCOUNTER — Encounter: Payer: Self-pay | Admitting: Internal Medicine

## 2022-04-10 ENCOUNTER — Other Ambulatory Visit (HOSPITAL_BASED_OUTPATIENT_CLINIC_OR_DEPARTMENT_OTHER): Payer: Self-pay

## 2022-04-10 DIAGNOSIS — I10 Essential (primary) hypertension: Secondary | ICD-10-CM

## 2022-04-11 LAB — BASIC METABOLIC PANEL
BUN/Creatinine Ratio: 15 (ref 10–24)
BUN: 18 mg/dL (ref 8–27)
CO2: 20 mmol/L (ref 20–29)
Calcium: 9.2 mg/dL (ref 8.6–10.2)
Chloride: 103 mmol/L (ref 96–106)
Creatinine, Ser: 1.2 mg/dL (ref 0.76–1.27)
Glucose: 91 mg/dL (ref 70–99)
Potassium: 4.7 mmol/L (ref 3.5–5.2)
Sodium: 140 mmol/L (ref 134–144)
eGFR: 69 mL/min/{1.73_m2} (ref >59)

## 2022-04-19 ENCOUNTER — Ambulatory Visit: Payer: 59 | Attending: Internal Medicine | Admitting: Internal Medicine

## 2022-04-19 ENCOUNTER — Encounter: Payer: Self-pay | Admitting: Internal Medicine

## 2022-04-19 VITALS — BP 152/85 | HR 66 | Resp 14 | Ht 71.0 in | Wt 200.0 lb

## 2022-04-19 DIAGNOSIS — M1A09X0 Idiopathic chronic gout, multiple sites, without tophus (tophi): Secondary | ICD-10-CM | POA: Diagnosis not present

## 2022-04-19 DIAGNOSIS — L4050 Arthropathic psoriasis, unspecified: Secondary | ICD-10-CM | POA: Diagnosis not present

## 2022-04-19 NOTE — Progress Notes (Signed)
Office Visit Note  Patient: AUDY DAUPHINE             Date of Birth: May 27, 1960           MRN: [id number]             PCP: Dorothyann Peng, NP Referring: Dorothyann Peng, NP Visit Date: 04/19/2022   Subjective:  Follow-up (Doing good)   History of Present Illness: DAYVON DAX is a 61 y.o. male here for follow up for psoriatic arthritis and gout on otezla and probencid. He had one mild flare up start after eating red meat and took 1 day of prednisone no other flares. He is noticing more persistent hand stiffness without visible swelling. Left index finger swelling at the proximal phalanx more on dorsal side for about 6 weeks. He was moving furniture and thinks this might have been an injury. Skin rashes are stable, few near areas acting up on his back temporarily improves with topical treatment.  Previous HPI 01/17/22 VERNE LANUZA is a 61 y.o. male here for follow up for psoriatic arthritis and gout. Skin symptoms have been pretty stable since last visit while off otezla. Joints are a problem he had flare ups in the left great for and right ankle with severe swelling and pain lasting for days. He took high amounts of aleve with benefit about 4-6 extra strength capsules daily. But the left toe flare did not improve we called in prednisone taper that improved within 48 hours.   Previous HPI 07/17/2021 COCHISE DINNEEN is a 61 y.o. male here for follow up for psoriatic arthritis on otezla started recently and was suffering some increased GI symptoms from this.  His original amount GI side effects improved though still has somewhat loose stool on a daily basis.  However started to develop pounding or throbbing type headaches particularly in the mornings with this medication and feels fatigued.  He has noticed partial improvement of his skin in multiple areas including his entire torso and on his shins there is still one bad lesion on his left leg.  Joint pain has not improved but she still  has a lot of pain and stiffness though not seeing any significant swelling.     Previous HPI 04/24/21 DONZEL ROMACK is a 61 y.o. male here for follow up for seronegative arthritis and gout off maintenance treatment due to suspected mucositis or pneumonitis with trial of methotrexate. He notices increasing pain and stiffness in his fingers without much swelling. Skin rashes without much change some increased peeling at scalp and ears without much erythema. He sustained a few bites from his dog to both hands during a recent trip for family holiday gathering in [place]. He has been applying neosporin to the affected areas mildly painful at this point largest cut on the right hand.   Previous HPI 10/26/20 GAITHER BIEHN is a 61 y.o. male here for follow up for his chronic joint pain of multiple sites and skin rashes.  He has continued on the gluten-free diet and thinks there is definitely some improvement with the skin disease while doing this. Laboratory testing at his initial visit showed a uric acid of 9.2 but was otherwise negative for acute inflammatory markers or rheumatoid arthritis serology or HLA-B27 allele.  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   Review of Systems  Constitutional:  Negative for fatigue.  HENT:  Negative for mouth sores and mouth dryness.   Eyes:  Negative for dryness.  Respiratory:  Negative for shortness of breath.   Cardiovascular:  Positive for chest pain and palpitations.  Gastrointestinal:  Negative for blood in stool, constipation and diarrhea.  Endocrine: Negative for increased urination.  Genitourinary:  Negative for involuntary urination.  Musculoskeletal:  Positive for joint pain, gait problem, joint pain and morning stiffness. Negative for joint swelling, myalgias, muscle weakness, muscle tenderness and myalgias.   Skin:  Positive for sensitivity to sunlight. Negative for color change, rash and hair loss.  Allergic/Immunologic: Negative for susceptible to infections.  Neurological:  Positive for dizziness. Negative for headaches.  Hematological:  Negative for swollen glands.  Psychiatric/Behavioral:  Negative for depressed mood and sleep disturbance. The patient is nervous/anxious.     PMFS History:  Patient Active Problem List   Diagnosis Date Noted   Dog bite 04/24/2021   High risk medication use 04/24/2021   Cough 12/26/2020   Psoriatic arthritis ([place]) 10/26/2020   Rash and other nonspecific skin eruption 10/06/2020   History of revision of total replacement of left hip joint 01/05/2019   Insomnia 05/29/2017   OSA (obstructive sleep apnea) 05/29/2017   Gastroesophageal reflux with apnea 05/29/2017   Generalized headache 01/25/2017   Testicular pain, left 04/26/2016   Routine general medical examination at a health care facility 10/12/2015   Essential hypertension 09/19/2015   Malignant melanoma of skin of left thigh ([place]) 04/06/2013   Skin cancer, basal cell 10/28/2012   Fasting hyperglycemia 10/28/2012   Other abnormal glucose 10/23/2012   GOUT 10/11/2008   Pain in joint 06/15/2008   Hyperlipidemia 04/23/2008   CARPAL TUNNEL SYNDROME 04/23/2008   INGUINAL PAIN, LEFT 11/20/2006    Past Medical History:  Diagnosis Date   Arthralgia of wrist, left    Arthritis    psoriatic arthritis   Cancer ([place])    Skin   Erectile dysfunction    Gout    Foot   Hyperlipidemia    Hypertension    Insomnia    PONV (postoperative nausea and vomiting)     Family History  Problem Relation Age of Onset   Heart attack Mother        < 25   Dementia Mother    Heart attack Father 30   COPD Sister    Breast cancer Sister    Colon cancer Neg Hx    Colon polyps Neg Hx    Esophageal cancer Neg Hx    Rectal cancer Neg Hx    Stomach cancer Neg Hx    Past Surgical History:  Procedure Laterality  Date   COLONOSCOPY     @ 64 due to rectal symptoms   HERNIA REPAIR  2008   Bilateral   MELANOMA EXCISION  12/13/2015   Located behind right ear   MELANOMA EXCISION WITH SENTINEL LYMPH NODE BIOPSY Left 01/15/2013   Procedure: EXCISION LEFT LEG MELANOMA WITH LEFT INGUINAL SENTINEL NODE BIOPSY;  Surgeon: Rolm Bookbinder, MD;  Location: [facility];  Service: General;  Laterality: Left;   TONSILLECTOMY  age 52   TOTAL HIP ARTHROPLASTY     Social History   Social History Narrative   He works in Product manager   No kids       Likes to be outside and hike.          Immunization History  Administered Date(s) Administered   Influenza,inj,Quad  PF,6+ Mos 01/25/2017, 04/09/2019, 02/02/2022   Moderna Sars-Covid-2 Vaccination 07/21/2019, 08/18/2019   PFIZER Comirnaty(Gray Top)Covid-19 Tri-Sucrose Vaccine 02/02/2022   Tdap 04/09/2019   Zoster Recombinat (Shingrix) 06/16/2021     Objective: Vital Signs: BP (!) 152/85 (BP Location: Left Arm, Patient Position: Sitting, Cuff Size: Normal)   Pulse 66   Resp 14   Ht _0  (1.803 m)   Wt 200 lb (90.7 kg)   BMI 27.89 kg/m    Physical Exam Cardiovascular:     Rate and Rhythm: Normal rate and regular rhythm.  Pulmonary:     Effort: Pulmonary effort is normal.     Breath sounds: Normal breath sounds.  Skin:    General: Skin is warm and dry.     Findings: Rash present.     Comments: Several raised, erythematous nodules on upper and middle back, left ankle  Neurological:     Mental Status: He is alert.  Psychiatric:        Mood and Affect: Mood normal.      Musculoskeletal Exam:  Shoulders full ROM no tenderness or swelling Elbows full ROM no tenderness or swelling Wrists full ROM no tenderness or swelling Fingers full ROM left 2nd digit with mild swelling on dorsal side over proximal phalanx, pain with full flexion Knees full ROM no tenderness or swelling, crepitus b/l worse on right knee Ankles  full ROM no tenderness or swelling  Investigation: No additional findings.  Imaging: No results found.  Recent Labs: Lab Results  Component Value Date   WBC 5.5 10/27/2021   HGB 14.9 10/27/2021   PLT 157.0 10/27/2021   NA 140 04/10/2022   K 4.7 04/10/2022   CL 103 04/10/2022   CO2 20 04/10/2022   GLUCOSE 91 04/10/2022   BUN 18 04/10/2022   CREATININE 1.20 04/10/2022   BILITOT 0.9 10/27/2021   ALKPHOS 77 10/27/2021   AST 17 10/27/2021   ALT 19 10/27/2021   PROT 7.1 10/27/2021   ALBUMIN 4.6 10/27/2021   CALCIUM 9.2 04/10/2022   GFRAA 82 (L) 01/12/2013   QFTBGOLDPLUS NEGATIVE 04/24/2021    Speciality Comments: No specialty comments available.  Procedures:  No procedures performed Allergies: Allopurinol, Atorvastatin, Citalopram, Ezetimibe-simvastatin, Fluoxetine hcl, Methotrexate derivatives, Otezla [apremilast], Rosuvastatin, Venlafaxine, Seroquel [quetiapine fumarate], and Trazodone and nefazodone   Assessment / Plan:     Visit Diagnoses: Chronic gout of multiple sites, unspecified cause - Plan: Uric acid  Appears to be doing well now without significant flareups over the past 3 months after addition of the probenecid 500 mg twice daily.  Will plan to continue current medication, if uric acid is well below goal can try tapering dose to once daily.  Currently not requiring any maintenance anti-inflammatory drug.  Psoriatic arthritis ([place]) - Plan: Sedimentation rate  Some skin inflammation in multiple areas but looks more related to his multiple basal cell nodules.  Do not see any plaques or scaling at this time.  Only peripheral joint swelling is on the left second digit appears to be tendon inflammation on the distribution.  Could be a strain injury would be in the timeframe for this cannot entirely exclude inflammatory tenosynovitis we will recheck a sedimentation rate today.  Orders: Orders Placed This Encounter  Procedures   Uric acid   Sedimentation rate   No  orders of the defined types were placed in this encounter.    Follow-Up Instructions: Return in about 6 months (around 10/19/2022) for Gout/PsA f/u 28mo.   CCollier Salina MD  Note - This record has been created using Dragon software.  Chart creation errors have been sought, but may not always  have been located. Such creation errors do not reflect on  the standard of medical care.  

## 2022-04-20 ENCOUNTER — Other Ambulatory Visit: Payer: Self-pay | Admitting: Internal Medicine

## 2022-04-20 DIAGNOSIS — M1A09X0 Idiopathic chronic gout, multiple sites, without tophus (tophi): Secondary | ICD-10-CM

## 2022-04-20 LAB — URIC ACID: Uric Acid, Serum: 5.2 mg/dL (ref 4.0–8.0)

## 2022-04-20 LAB — SEDIMENTATION RATE: Sed Rate: 6 mm/h (ref 0–20)

## 2022-04-22 ENCOUNTER — Other Ambulatory Visit: Payer: Self-pay | Admitting: Adult Health

## 2022-04-22 DIAGNOSIS — F5101 Primary insomnia: Secondary | ICD-10-CM

## 2022-04-23 NOTE — Progress Notes (Signed)
Uric acid is improved to 5.2 which looks great. I do recommend staying on the probenecid 500 mg twice daily at this time.

## 2022-05-22 MED ORDER — REPATHA SURECLICK 140 MG/ML ~~LOC~~ SOAJ: 1.0000 mL | SUBCUTANEOUS | 11 refills | Status: DC

## 2022-05-31 ENCOUNTER — Other Ambulatory Visit: Payer: Self-pay | Admitting: Internal Medicine

## 2022-05-31 DIAGNOSIS — M1A09X0 Idiopathic chronic gout, multiple sites, without tophus (tophi): Secondary | ICD-10-CM

## 2022-06-01 NOTE — Telephone Encounter (Signed)
Next Visit: 10/19/2022  Last Visit: 04/19/2022  Last Fill: 03/19/2022  DX: Chronic gout of multiple sites, unspecified cause   Current Dose per office note on 04/19/2022: probenecid 500 mg twice daily.   Labs: 04/19/2022 uric acid 5.2  Okay to refill probenecid?

## 2022-07-17 ENCOUNTER — Encounter: Payer: Self-pay | Admitting: Internal Medicine

## 2022-07-29 ENCOUNTER — Other Ambulatory Visit: Payer: Self-pay | Admitting: Internal Medicine

## 2022-07-29 DIAGNOSIS — M1A09X0 Idiopathic chronic gout, multiple sites, without tophus (tophi): Secondary | ICD-10-CM

## 2022-08-01 ENCOUNTER — Encounter: Payer: Self-pay | Admitting: Internal Medicine

## 2022-08-01 DIAGNOSIS — M1A09X0 Idiopathic chronic gout, multiple sites, without tophus (tophi): Secondary | ICD-10-CM

## 2022-08-02 MED ORDER — PREDNISONE 10 MG PO TABS: ORAL_TABLET | ORAL | 0 refills | Status: DC

## 2022-08-13 ENCOUNTER — Other Ambulatory Visit: Payer: Self-pay | Admitting: Adult Health

## 2022-08-13 DIAGNOSIS — F5101 Primary insomnia: Secondary | ICD-10-CM

## 2022-08-15 ENCOUNTER — Ambulatory Visit: Payer: 59 | Admitting: Internal Medicine

## 2022-10-02 ENCOUNTER — Other Ambulatory Visit: Payer: Self-pay | Admitting: Internal Medicine

## 2022-10-02 DIAGNOSIS — E7849 Other hyperlipidemia: Secondary | ICD-10-CM

## 2022-10-19 ENCOUNTER — Ambulatory Visit: Payer: 59 | Attending: Internal Medicine | Admitting: Internal Medicine

## 2022-10-19 ENCOUNTER — Encounter: Payer: Self-pay | Admitting: Internal Medicine

## 2022-10-19 VITALS — BP 129/77 | HR 64 | Resp 12 | Ht 71.0 in | Wt 193.0 lb

## 2022-10-19 DIAGNOSIS — M1A09X0 Idiopathic chronic gout, multiple sites, without tophus (tophi): Secondary | ICD-10-CM

## 2022-10-19 DIAGNOSIS — L4050 Arthropathic psoriasis, unspecified: Secondary | ICD-10-CM | POA: Diagnosis not present

## 2022-10-19 DIAGNOSIS — C4491 Basal cell carcinoma of skin, unspecified: Secondary | ICD-10-CM

## 2022-10-19 DIAGNOSIS — M25551 Pain in right hip: Secondary | ICD-10-CM | POA: Diagnosis not present

## 2022-10-19 DIAGNOSIS — M25552 Pain in left hip: Secondary | ICD-10-CM

## 2022-10-19 MED ORDER — PROBENECID 500 MG PO TABS: 500.0000 mg | ORAL_TABLET | Freq: Two times a day (BID) | ORAL | 1 refills | Status: DC

## 2022-10-19 NOTE — Patient Instructions (Signed)
Try decreasing probencid to once daily, you can come back for labs after at least 1 full month on the new dosing to recheck.  I attached some exercises for the hip pain. If not improving with these or as needed pain medications we could refer to see physical therapy.

## 2022-10-19 NOTE — Progress Notes (Signed)
Office Visit Note  Patient: Raymond Long             Date of Birth: 03-18-1961           MRN: [id number]             PCP: Shirline Frees, NP Referring: Shirline Frees, NP Visit Date: 10/19/2022   Subjective:  Follow-up (Patient states he has been told he has Basal cells and is referred to another dermatologist to get a diagnoses. )   History of Present Illness: Raymond Long is a 62 y.o. male here for follow up for gout and psoriatic arthritis currently on probenecid 500 mg twice daily.  Since her last visit he has not had severe flareups occasionally gets pain usually at the right great toe or right heel sometimes with swelling affecting the left foot.  This usually gets better with some Aleve or has taken 5 mg prednisone for a few days with good benefit as needed.  Still has GI irritation with the probenecid.  He retired from his job which has been a big improvement in stress level but feels his physical activity during the increased free time is limited somewhat with arthritis.  Also noticing increase in bilateral hip pain and stiffness this is most bothersome at nighttime and throughout the morning it takes a while to feel like he has normal flexibility on both sides.  Also had increase in skin rashes especially a painful area on the side of the neck.  His dermatologist referred him to [facility] for complicated condition with extensive basal cell carcinomas.  Previous HPI 04/19/22 Raymond Long is a 62 y.o. male here for follow up for psoriatic arthritis and gout on otezla and probencid. He had one mild flare up start after eating red meat and took 1 day of prednisone no other flares. He is noticing more persistent hand stiffness without visible swelling. Left index finger swelling at the proximal phalanx more on dorsal side for about 6 weeks. He was moving furniture and thinks this might have been an injury. Skin rashes are stable, few near areas acting up on his back temporarily  improves with topical treatment.   Previous HPI 01/17/22 Raymond Long is a 62 y.o. male here for follow up for psoriatic arthritis and gout. Skin symptoms have been pretty stable since last visit while off otezla. Joints are a problem he had flare ups in the left great for and right ankle with severe swelling and pain lasting for days. He took high amounts of aleve with benefit about 4-6 extra strength capsules daily. But the left toe flare did not improve we called in prednisone taper that improved within 48 hours.   Previous HPI 07/17/2021 Raymond Long is a 62 y.o. male here for follow up for psoriatic arthritis on otezla started recently and was suffering some increased GI symptoms from this.  His original amount GI side effects improved though still has somewhat loose stool on a daily basis.  However started to develop pounding or throbbing type headaches particularly in the mornings with this medication and feels fatigued.  He has noticed partial improvement of his skin in multiple areas including his entire torso and on his shins there is still one bad lesion on his left leg.  Joint pain has not improved but she still has a lot of pain and stiffness though not seeing any significant swelling.     Previous HPI 04/24/21 Raymond Long is a 62  y.o. male here for follow up for seronegative arthritis and gout off maintenance treatment due to suspected mucositis or pneumonitis with trial of methotrexate. He notices increasing pain and stiffness in his fingers without much swelling. Skin rashes without much change some increased peeling at scalp and ears without much erythema. He sustained a few bites from his dog to both hands during a recent trip for family holiday gathering in [place]. He has been applying neosporin to the affected areas mildly painful at this point largest cut on the right hand.   Previous HPI 10/26/20 Raymond Long is a 61 y.o. male here for follow up for his  chronic joint pain of multiple sites and skin rashes.  He has continued on the gluten-free diet and thinks there is definitely some improvement with the skin disease while doing this. Laboratory testing at his initial visit showed a uric acid of 9.2 but was otherwise negative for acute inflammatory markers or rheumatoid arthritis serology or HLA-B27 allele.  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  HENT:  Negative for mouth sores and mouth dryness.   Eyes:  Positive for dryness.  Respiratory:  Positive for shortness of breath.   Cardiovascular:  Positive for chest pain and palpitations.  Gastrointestinal:  Positive for diarrhea. Negative for blood in stool and constipation.  Endocrine: Positive for increased urination.  Genitourinary:  Negative for involuntary urination.  Musculoskeletal:  Positive for joint pain, joint pain, joint swelling, myalgias, morning stiffness and myalgias. Negative for gait problem, muscle weakness and muscle tenderness.  Skin:  Positive for sensitivity to sunlight. Negative for color change, rash and hair loss.  Allergic/Immunologic: Negative for susceptible to infections.  Neurological:  Positive for dizziness and headaches.  Hematological:  Negative for swollen glands.  Psychiatric/Behavioral:  Positive for depressed mood and sleep disturbance. The patient is nervous/anxious.     PMFS History:  Patient Active Problem List   Diagnosis Date Noted   Dog bite 04/24/2021   High risk medication use 04/24/2021   Cough 12/26/2020   Psoriatic arthritis ([facility]) 10/26/2020   Rash and other nonspecific skin eruption 10/06/2020   History of revision of total replacement of left hip joint 01/05/2019   Insomnia 05/29/2017   OSA (obstructive sleep apnea) 05/29/2017    Gastroesophageal reflux with apnea 05/29/2017   Generalized headache 01/25/2017   Testicular pain, left 04/26/2016   Routine general medical examination at a health care facility 10/12/2015   Essential hypertension 09/19/2015   Malignant melanoma of skin of left thigh ([facility]) 04/06/2013   Skin cancer, basal cell 10/28/2012   Fasting hyperglycemia 10/28/2012   Other abnormal glucose 10/23/2012   GOUT 10/11/2008   Pain in joint 06/15/2008   Hyperlipidemia 04/23/2008   CARPAL TUNNEL SYNDROME 04/23/2008   INGUINAL PAIN, LEFT 11/20/2006    Past Medical History:  Diagnosis Date   Arthralgia of wrist, left    Arthritis    psoriatic arthritis   Cancer ([facility])    Skin   Erectile dysfunction    Gout    Foot   Hyperlipidemia    Hypertension    Insomnia    PONV (postoperative nausea and vomiting)     Family History  Problem Relation Age of Onset   Heart attack Mother        < 50  Dementia Mother    Heart attack Father 48   COPD Sister    Breast cancer Sister    Colon cancer Neg Hx    Colon polyps Neg Hx    Esophageal cancer Neg Hx    Rectal cancer Neg Hx    Stomach cancer Neg Hx    Past Surgical History:  Procedure Laterality Date   COLONOSCOPY     @ 36 due to rectal symptoms   HERNIA REPAIR  2008   Bilateral   MELANOMA EXCISION  12/13/2015   Located behind right ear   MELANOMA EXCISION WITH SENTINEL LYMPH NODE BIOPSY Left 01/15/2013   Procedure: EXCISION LEFT LEG MELANOMA WITH LEFT INGUINAL SENTINEL NODE BIOPSY;  Surgeon: Emelia Loron, MD;  Location: [facility];  Service: General;  Laterality: Left;   TONSILLECTOMY  age 82   TOTAL HIP ARTHROPLASTY     Social History   Social History Narrative   He works in Teacher, English as a foreign language   No kids       Likes to be outside and hike.          Immunization History  Administered Date(s) Administered   Influenza,inj,Quad PF,6+ Mos 01/25/2017, 04/09/2019, 02/02/2022   Moderna Sars-Covid-2  Vaccination 07/21/2019, 08/18/2019   PFIZER Comirnaty(Gray Top)Covid-19 Tri-Sucrose Vaccine 02/02/2022   Tdap 04/09/2019   Zoster Recombinat (Shingrix) 06/16/2021     Objective: Vital Signs: BP 129/77 (BP Location: Left Arm, Patient Position: Sitting, Cuff Size: Normal)   Pulse 64   Resp 12   Ht 5\' 11"  (1.803 m)   Wt 193 lb (87.5 kg)   BMI 26.92 kg/m    Physical Exam Eyes:     Conjunctiva/sclera: Conjunctivae normal.  Cardiovascular:     Rate and Rhythm: Normal rate and regular rhythm.  Pulmonary:     Effort: Pulmonary effort is normal.     Breath sounds: Normal breath sounds.  Lymphadenopathy:     Cervical: No cervical adenopathy.  Skin:    General: Skin is warm and dry.     Findings: Rash present.     Comments: Multiple nodular rashes on left side of neck, multiple throughout back, left shin No surrounding induration or erythema  Neurological:     Mental Status: He is alert.  Psychiatric:        Mood and Affect: Mood normal.      Musculoskeletal Exam:  Shoulders full ROM no tenderness or swelling Elbows full ROM no tenderness or swelling Wrists full ROM no tenderness or swelling Fingers full ROM no tenderness or swelling Mild left hip lateral tenderness to pressure more along the posterior of greater trochanter, none on the lateral side none extending down the leg, lateral pain provoked with FADIR maneuver Knees full ROM no tenderness or swelling Ankles full ROM no tenderness or swelling High arches, chronic first MTP joint widening on both feet with decreased dorsiflexion no tenderness or palpable swelling  Investigation: No additional findings.  Imaging: No results found.  Recent Labs: Lab Results  Component Value Date   WBC 5.5 10/27/2021   HGB 14.9 10/27/2021   PLT 157.0 10/27/2021   NA 140 04/10/2022   K 4.7 04/10/2022   CL 103 04/10/2022   CO2 20 04/10/2022   GLUCOSE 91 04/10/2022   BUN 18 04/10/2022   CREATININE 1.20 04/10/2022   BILITOT 0.9  10/27/2021   ALKPHOS 77 10/27/2021   AST 17 10/27/2021   ALT 19 10/27/2021   PROT 7.1 10/27/2021  ALBUMIN 4.6 10/27/2021   CALCIUM 9.2 04/10/2022   GFRAA 82 (L) 01/12/2013   QFTBGOLDPLUS NEGATIVE 04/24/2021    Speciality Comments: No specialty comments available.  Procedures:  No procedures performed Allergies: Allopurinol, Atorvastatin, Citalopram, Ezetimibe-simvastatin, Fluoxetine hcl, Methotrexate derivatives, Otezla [apremilast], Rosuvastatin, Venlafaxine, Seroquel [quetiapine fumarate], and Trazodone and nefazodone   Assessment / Plan:     Visit Diagnoses: Chronic gout of multiple sites, unspecified cause - Plan: probenecid (BENEMID) 500 MG tablet, Uric acid  Gout pretty well-controlled requiring medication very infrequently and responding to prednisone only 5 mg daily.  So not sure if these are all true flareups or not.  Is having some GI intolerance that has not gotten better with longer duration of treatment on probenecid.  Previous uric acid level was 5.2.  He will try decreasing to 500 mg once daily.  Come back for repeat uric acid level after at least 1 month after making dose adjustment.  Psoriatic arthritis (HCC)  Inflammatory arthritis appears under good control today.  No evidence of active synovitis or enthesitis on exam today.  Skin cancer, basal cell  Has referral to [facility] specialist for multiple basal cell cancers.  Appointment in July.  Bilateral hip pain  Hip pain primarily in the lateral aspect of the joint more severe with pressure and after prolonged stationary position.  Discussed I suspect this is more related to tendinopathy or bursitis not intra-articular pathology.  He had previous left hip replacement years ago and the side is giving him slightly more trouble.  Discussed initial conservative treatments including use of Aleve as needed, provided printed range of motion and strengthening exercises. If not improving would be candidate for trying physical  therapy, local steroid injection or can try addition of muscle relaxant medication at night.  Orders: Orders Placed This Encounter  Procedures   Uric acid   Meds ordered this encounter  Medications   probenecid (BENEMID) 500 MG tablet    Sig: Take 1 tablet (500 mg total) by mouth 2 (two) times daily.    Dispense:  180 tablet    Refill:  1     Follow-Up Instructions: Return in about 6 months (around 04/20/2023) for Gout on probencid f/u 6mos.   Fuller Plan, MD  Note - This record has been created using [employer].  Chart creation errors have been sought, but may not always  have been located. Such creation errors do not reflect on  the standard of medical care.

## 2022-11-14 ENCOUNTER — Other Ambulatory Visit: Payer: Self-pay | Admitting: Adult Health

## 2022-11-17 LAB — NMR, LIPOPROFILE
Cholesterol, Total: 306 mg/dL — ABNORMAL HIGH (ref 100–199)
HDL Particle Number: 23 umol/L — ABNORMAL LOW (ref >=30.5)
HDL-C: 37 mg/dL — ABNORMAL LOW (ref >39)
LDL Particle Number: 3441 nmol/L — ABNORMAL HIGH (ref <1000)
LDL Size: 20.1 nm — ABNORMAL LOW (ref >20.5)
LDL-C (NIH Calc): 250 mg/dL — ABNORMAL HIGH (ref 0–99)
LP-IR Score: 74 — ABNORMAL HIGH (ref <=45)
Small LDL Particle Number: 2070 nmol/L — ABNORMAL HIGH (ref <=527)
Triglycerides: 105 mg/dL (ref 0–149)

## 2022-11-17 LAB — LIPOPROTEIN A (LPA): Lipoprotein (a): 74.3 nmol/L (ref <75.0)

## 2022-11-23 ENCOUNTER — Telehealth: Payer: Self-pay | Admitting: Internal Medicine

## 2022-11-23 ENCOUNTER — Ambulatory Visit: Payer: 59 | Attending: Internal Medicine | Admitting: Internal Medicine

## 2022-11-23 VITALS — BP 132/88 | HR 62 | Ht 72.0 in | Wt 192.6 lb

## 2022-11-23 DIAGNOSIS — E7849 Other hyperlipidemia: Secondary | ICD-10-CM | POA: Diagnosis not present

## 2022-11-23 DIAGNOSIS — T466X5D Adverse effect of antihyperlipidemic and antiarteriosclerotic drugs, subsequent encounter: Secondary | ICD-10-CM | POA: Diagnosis not present

## 2022-11-23 DIAGNOSIS — L4050 Arthropathic psoriasis, unspecified: Secondary | ICD-10-CM | POA: Diagnosis not present

## 2022-11-23 DIAGNOSIS — M609 Myositis, unspecified: Secondary | ICD-10-CM | POA: Diagnosis not present

## 2022-11-23 DIAGNOSIS — E7801 Familial hypercholesterolemia: Secondary | ICD-10-CM | POA: Insufficient documentation

## 2022-11-23 DIAGNOSIS — E78019 Familial hypercholesterolemia, unspecified: Secondary | ICD-10-CM | POA: Insufficient documentation

## 2022-11-23 NOTE — Telephone Encounter (Signed)
Patient has been identified as candidate for Leqvio  Benefits investigation form faxed 11/23/22  Co-pay card info (provided by patient) ID: [id number] Group #: [id number] [id number] PCN: [id number]    Patient had previously been screened for Leqvio and insurance did not approve (?) -- sounds like step therapy [id number]. He tried Repatha and had side effects. LDL is 250.

## 2022-11-23 NOTE — Patient Instructions (Signed)
Medication Instructions:  Dr. Rennis Golden has recommended an injectable medication called LEQVIO. This is administered by a health care provider. The frequency of injections is TWO injections given 3 months apart (loading dose) and then every 6 months after that. The injection appointments at [facility] ([address] [place], [place]  [address]). Once we have the benefits check information, we will reach out to let you know if the medication is covered 100%, if there is a deductible, co-insurance, out-of-pocket max. From there, we will see if you need patient assistance and our team will take care of working on this. Because of the frequency schedule of this medication, your follow up/repeat cholesterol lab work will be about 5-6 months from now.   *If you need a refill on your cardiac medications before your next appointment, please call your pharmacy*   Lab Work: FASTING lab work to check cholesterol in 5-6 months  If you have labs (blood work) drawn today and your tests are completely normal, you will receive your results only by: MyChart Message (if you have MyChart) OR A paper copy in the mail If you have any lab test that is abnormal or we need to change your treatment, we will call you to review the results.   Follow-Up: At [facility], you and your health needs are our priority.  As part of our continuing mission to provide you with exceptional heart care, we have created designated Provider Care Teams.  These Care Teams include your primary Cardiologist (physician) and Advanced Practice Providers (APPs -  Physician Assistants and Nurse Practitioners) who all work together to provide you with the care you need, when you need it.  We recommend signing up for the patient portal called "MyChart".  Sign up information is provided on this After Visit Summary.  MyChart is used to connect with patients for Virtual Visits (Telemedicine).  Patients are able to view  lab/test results, encounter notes, upcoming appointments, etc.  Non-urgent messages can be sent to your provider as well.   To learn more about what you can do with MyChart, go to [email or web address].    Your next appointment:    5-6 months with Dr. Rennis Golden

## 2022-11-23 NOTE — Progress Notes (Signed)
LIPID CLINIC CONSULT NOTE  Chief Complaint:  Follow-up dyslipidemia  Primary Care Physician: Raymond Frees, NP  Primary Cardiologist:  None  HPI:  Raymond Long is a 62 y.o. male who is being seen today for the evaluation of dyslipidemia at the request of Raymond Frees, NP.  This is a pleasant 62 year old male kindly referred for evaluation management of dyslipidemia.  He reports longstanding issues with cholesterol that has been difficult to control due to side effects with the statins.  He has previously tried atorvastatin, rosuvastatin and Vytorin, all of which caused myalgias and increase in his CKs.  He does report family history of heart disease in both parents in their 8s.  His untreated cholesterol has been well over 190 and is up to the 200s in the past.  More recently his total cholesterol was 256, triglycerides 147, HDL 28 and LDL 199.  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  He also has a history of psoriatic arthritis on treatment and this is highly significant for an at risk factor for heart disease, typically conferring up to 7 times the risk of heart disease compared to an on psoriatic arthritis patient.  11/23/2022  Raymond Long is seen today in follow-up. Unfortunately he had significant side effects on Repatha, including severe chest pain, palpitations, headaches, GERD, skin rash and vivid dreams. This apparently completely dissipated when he stopped the medication. His options are quite limited since he cannot tolerate the statins. He may be able to tolerate zetia - so remaining options may include Leqvio and Nexlitol (Nexlizet).  PMHx:  Past Medical History:  Diagnosis Date   Arthralgia of wrist, left    Arthritis    psoriatic arthritis   Cancer (HCC)    Skin   Erectile dysfunction    Gout    Foot   Hyperlipidemia    Hypertension    Insomnia    PONV (postoperative nausea and vomiting)     Past Surgical History:  Procedure  Laterality Date   COLONOSCOPY     @ 49 due to rectal symptoms   HERNIA REPAIR  2008   Bilateral   MELANOMA EXCISION  12/13/2015   Located behind right ear   MELANOMA EXCISION WITH SENTINEL LYMPH NODE BIOPSY Left 01/15/2013   Procedure: EXCISION LEFT LEG MELANOMA WITH LEFT INGUINAL SENTINEL NODE BIOPSY;  Surgeon: Emelia Loron, MD;  Location: [facility];  Service: General;  Laterality: Left;   TONSILLECTOMY  age 40   TOTAL HIP ARTHROPLASTY      FAMHx:  Family History  Problem Relation Age of Onset   Heart attack Mother        < 9   Dementia Mother    Heart attack Father 40   COPD Sister    Breast cancer Sister    Colon cancer Neg Hx    Colon polyps Neg Hx    Esophageal cancer Neg Hx    Rectal cancer Neg Hx    Stomach cancer Neg Hx     SOCHx:   reports that he quit smoking about 21 years ago. His smoking use included cigarettes. He started smoking about 41 years ago. He has a 40 pack-year smoking history. He has never been exposed to tobacco smoke. He has never used smokeless tobacco. He reports that he does not drink alcohol and does not use drugs.  ALLERGIES:  Allergies  Allergen Reactions   Allopurinol     Itching, redness, scaling to his skin and swelling  in the ankle   Atorvastatin    Citalopram    Ezetimibe-Simvastatin     REACTION: up cpk   Fluoxetine Hcl    Methotrexate Derivatives Other (See Comments)   Henderson [place] [Apremilast]    Rosuvastatin     REACTION: up cpk   Venlafaxine    Seroquel [Quetiapine Fumarate] Palpitations    Racing thoughts and restless sleep   Trazodone And Nefazodone Palpitations    ROS: Pertinent items noted in HPI and remainder of comprehensive ROS otherwise negative.  HOME MEDS: Current Outpatient Medications on File Prior to Visit  Medication Sig Dispense Refill   amLODipine (NORVASC) 10 MG tablet TAKE 1 TABLET BY MOUTH EVERY DAY 30 tablet 11   clindamycin (CLEOCIN T) 1 % external solution Apply 1 Application  topically daily.     fluorouracil (EFUDEX) 5 % cream SMARTSIG:1 Topical 4 Times Daily (Patient not taking: Reported on 04/19/2022)     hydrOXYzine (ATARAX/VISTARIL) 25 MG tablet Take 25 mg by mouth at bedtime.     imiquimod (ALDARA) 5 % cream SMARTSIG:1 Topical Daily PRN     mirtazapine (REMERON) 30 MG tablet TAKE 1 TABLET BY MOUTH EVERYDAY AT BEDTIME 90 tablet 0   predniSONE (DELTASONE) 10 MG tablet Take as needed for gout flares 20 tablet 0   probenecid (BENEMID) 500 MG tablet Take 1 tablet (500 mg total) by mouth 2 (two) times daily. 180 tablet 1   telmisartan (MICARDIS) 80 MG tablet Take 1 tablet (80 mg total) by mouth daily. 90 tablet 3   No current facility-administered medications on file prior to visit.    LABS/IMAGING: No results found for this or any previous visit (from the past 48 hour(s)). No results found.  LIPID PANEL:    Component Value Date/Time   CHOL 301 (H) 10/27/2021 1333   CHOL 251 (H) 11/02/2014 1026   TRIG 151.0 (H) 10/27/2021 1333   TRIG 186 (H) 11/02/2014 1026   TRIG 89 04/23/2006 0959   HDL 38.60 (L) 10/27/2021 1333   HDL 29 (L) 11/02/2014 1026   CHOLHDL 8 10/27/2021 1333   VLDL 30.2 10/27/2021 1333   LDLCALC 232 (H) 10/27/2021 1333   LDLCALC 185 (H) 11/02/2014 1026   LDLDIRECT 176.9 06/09/2008 0958    WEIGHTS: Wt Readings from Last 3 Encounters:  11/23/22 192 lb 9.6 oz (87.4 kg)  10/19/22 193 lb (87.5 kg)  04/19/22 200 lb (90.7 kg)    VITALS: BP 132/88   Pulse 62   Ht 6' (1.829 m)   Wt 192 lb 9.6 oz (87.4 kg)   SpO2 96%   BMI 26.12 kg/m   EXAM: Deferred  EKG: N/A  ASSESSMENT: Probable familial hyperlipidemia, based on Simon Broome criteria Longstanding history of LDL cholesterol greater than 190 Premature coronary disease in both parents in their 60s 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   [name] could not tolerate Repatha, citing numerous side-effects. His  cholesterol remains high and his psoriatic arthritis is also not well-treated, due to medication side-effects. This is a high risk combination for stroke or MI. He needs better control of inflammation and dramatic lipid reduction. Since he has been intolerant to PCSK9 inhibitors, his next best option is Leqvio. Will work on a benefits determination for this. Plan follow-up in 6 months, hopefully after  2 shots with repeat lipid NMR.  Chrystie Nose, MD, [facility], FACP  [place]  [facility]  Medical Director of the Advanced Lipid Disorders &  Cardiovascular  Risk Reduction Clinic Diplomate of the American Board of Clinical Lipidology Attending Cardiologist  Direct Dial: [phone]  Fax: [phone]  Website:  [email or web address] 11/23/2022, 9:41 AM

## 2022-11-27 ENCOUNTER — Encounter: Payer: Self-pay | Admitting: Internal Medicine

## 2022-11-30 ENCOUNTER — Encounter: Payer: Self-pay | Admitting: Adult Health

## 2022-11-30 ENCOUNTER — Ambulatory Visit (INDEPENDENT_AMBULATORY_CARE_PROVIDER_SITE_OTHER): Payer: 59 | Admitting: Adult Health

## 2022-11-30 VITALS — BP 136/90 | HR 63 | Temp 98.4°F | Ht 70.5 in | Wt 194.0 lb

## 2022-11-30 DIAGNOSIS — E7801 Familial hypercholesterolemia: Secondary | ICD-10-CM

## 2022-11-30 DIAGNOSIS — Z0000 Encounter for general adult medical examination without abnormal findings: Secondary | ICD-10-CM

## 2022-11-30 DIAGNOSIS — Z125 Encounter for screening for malignant neoplasm of prostate: Secondary | ICD-10-CM

## 2022-11-30 DIAGNOSIS — M1A0710 Idiopathic chronic gout, right ankle and foot, without tophus (tophi): Secondary | ICD-10-CM

## 2022-11-30 DIAGNOSIS — C4491 Basal cell carcinoma of skin, unspecified: Secondary | ICD-10-CM

## 2022-11-30 DIAGNOSIS — I10 Essential (primary) hypertension: Secondary | ICD-10-CM | POA: Diagnosis not present

## 2022-11-30 DIAGNOSIS — F5101 Primary insomnia: Secondary | ICD-10-CM

## 2022-11-30 DIAGNOSIS — L4050 Arthropathic psoriasis, unspecified: Secondary | ICD-10-CM

## 2022-11-30 LAB — COMPREHENSIVE METABOLIC PANEL
ALT: 15 U/L (ref 0–53)
AST: 14 U/L (ref 0–37)
Albumin: 4.6 g/dL (ref 3.5–5.2)
Alkaline Phosphatase: 78 U/L (ref 39–117)
BUN: 17 mg/dL (ref 6–23)
CO2: 28 mEq/L (ref 19–32)
Calcium: 9.6 mg/dL (ref 8.4–10.5)
Chloride: 101 mEq/L (ref 96–112)
Creatinine, Ser: 1.21 mg/dL (ref 0.40–1.50)
GFR: 64.16 mL/min (ref >60.00)
Glucose, Bld: 79 mg/dL (ref 70–99)
Potassium: 4.1 mEq/L (ref 3.5–5.1)
Sodium: 139 mEq/L (ref 135–145)
Total Bilirubin: 0.7 mg/dL (ref 0.2–1.2)
Total Protein: 7 g/dL (ref 6.0–8.3)

## 2022-11-30 LAB — CBC WITH DIFFERENTIAL/PLATELET
Basophils Absolute: 0 10*3/uL (ref 0.0–0.1)
Basophils Relative: 0.2 % (ref 0.0–3.0)
Eosinophils Absolute: 0.1 10*3/uL (ref 0.0–0.7)
Eosinophils Relative: 1.5 % (ref 0.0–5.0)
HCT: 44.5 % (ref 39.0–52.0)
Hemoglobin: 15.1 g/dL (ref 13.0–17.0)
Lymphocytes Relative: 40.1 % (ref 12.0–46.0)
Lymphs Abs: 2.4 10*3/uL (ref 0.7–4.0)
MCHC: 33.9 g/dL (ref 30.0–36.0)
MCV: 92.4 fl (ref 78.0–100.0)
Monocytes Absolute: 0.8 10*3/uL (ref 0.1–1.0)
Monocytes Relative: 12.9 % — ABNORMAL HIGH (ref 3.0–12.0)
Neutro Abs: 2.7 10*3/uL (ref 1.4–7.7)
Neutrophils Relative %: 45.3 % (ref 43.0–77.0)
Platelets: 173 10*3/uL (ref 150.0–400.0)
RBC: 4.81 Mil/uL (ref 4.22–5.81)
RDW: 13.3 % (ref 11.5–15.5)
WBC: 6 10*3/uL (ref 4.0–10.5)

## 2022-11-30 LAB — PSA: PSA: 1.84 ng/mL (ref 0.10–4.00)

## 2022-11-30 MED ORDER — MIRTAZAPINE 30 MG PO TABS
ORAL_TABLET | ORAL | 1 refills | Status: DC
Start: 2022-11-30 — End: 2023-07-03

## 2022-11-30 NOTE — Progress Notes (Signed)
Subjective:    Patient ID: Raymond Long, male    DOB: Feb 14, 1961, 62 y.o.   MRN: [id number]  HPI Patient presents for yearly preventative medicine examination. He is a pleasant 62 year old male who  has a past medical history of Arthralgia of wrist, left, Arthritis, Cancer (HCC), Erectile dysfunction, Gout, Hyperlipidemia, Hypertension, Insomnia, and PONV (postoperative nausea and vomiting).  He retired in January 2024.   HTN - takes Norvasc 10 mg daily and Micardis 80 mg daily.   He denies dizziness, lightheadedness, chest pain,, shortness of breath. He does monitor his BP at home and reports readings between 140-150/90's.  BP Readings from Last 3 Encounters:  11/30/22 (!) 136/90  11/23/22 132/88  10/19/22 129/77   Hx of gout - takes Benemid 500 mg BID and prednisone PRN. He has had many gout flares over the last year.   Insomnia -takes Remeron 30 mg QHS and Atarax 25 mg QHS.   Familial Hyperlipidemia- has failed statin therapy and Zetia in the past.  Both of these caused elevated liver enzymes.  He was referred over to the lipid clinic who recommend a PCSK9 inhibitor as he has failed statins in the past and psoriatic arthritis is a major risk factor for coronary artery disease. He was tried on Repatha but had multiple side effects including chest pain, palpitations, headaches, GERD, skin rash, and vivid dreams. Cardiology is working on getting him approved for [employer].  Lab Results  Component Value Date   CHOL 301 (H) 10/27/2021   HDL 38.60 (L) 10/27/2021   LDLCALC 232 (H) 10/27/2021   LDLDIRECT 176.9 06/09/2008   TRIG 151.0 (H) 10/27/2021   CHOLHDL 8 10/27/2021    Psoriatic arthritis-managed by rheumatology. He did not tolerate Otezela.   History of skin cancer ( melanoma and BCC) - is seen by dermatology at [facility] currently. He is going to be having Mohs surgery. Thinking about Erivedge    All immunizations and health maintenance protocols were reviewed with the patient and  needed orders were placed. He is up to date on routine vaccinations   Appropriate screening laboratory values were ordered for the patient including screening of hyperlipidemia, renal function and hepatic function. If indicated by BPH, a PSA was ordered.  Medication reconciliation,  past medical history, social history, problem list and allergies were reviewed in detail with the patient  Goals were established with regard to weight loss, exercise, and  diet in compliance with medications Wt Readings from Last 3 Encounters:  11/30/22 194 lb (88 kg)  11/23/22 192 lb 9.6 oz (87.4 kg)  10/19/22 193 lb (87.5 kg)   He is up to date on colon cancer screening   Review of Systems  Constitutional: Negative.   HENT: Negative.    Eyes: Negative.   Respiratory: Negative.    Cardiovascular: Negative.   Gastrointestinal: Negative.   Endocrine: Negative.   Genitourinary: Negative.   Musculoskeletal: Negative.   Skin: Negative.   Allergic/Immunologic: Negative.   Neurological: Negative.   Hematological: Negative.   Psychiatric/Behavioral: Negative.    All other systems reviewed and are negative.  Past Medical History:  Diagnosis Date   Arthralgia of wrist, left    Arthritis    psoriatic arthritis   Cancer (HCC)    Skin   Erectile dysfunction    Gout    Foot   Hyperlipidemia    Hypertension    Insomnia    PONV (postoperative nausea and vomiting)     Social History  Socioeconomic History   Marital status: Media planner    Spouse name: Not on file   Number of children: Not on file   Years of education: Not on file   Highest education level: Not on file  Occupational History   Not on file  Tobacco Use   Smoking status: Former    Current packs/day: 0.00    Average packs/day: 2.0 packs/day for 20.0 years (40.0 ttl pk-yrs)    Types: Cigarettes    Start date: 01/09/1981    Quit date: 01/09/2001    Years since quitting: 21.9    Passive exposure: Never   Smokeless tobacco:  Never   Tobacco comments:    1978-2002, up to 2 ppd  Vaping Use   Vaping status: Never Used  Substance and Sexual Activity   Alcohol use: No    Comment:  in AA   Drug use: No   Sexual activity: Not on file  Other Topics Concern   Not on file  Social History Narrative   He works in Teacher, English as a foreign language   No kids       Likes to be outside and hike.          Social Determinants of Health   Financial Resource Strain: Not on file  Food Insecurity: Not on file  Transportation Needs: Not on file  Physical Activity: Not on file  Stress: Not on file  Social Connections: Not on file  Intimate Partner Violence: Not on file    Past Surgical History:  Procedure Laterality Date   COLONOSCOPY     @ 47 due to rectal symptoms   HERNIA REPAIR  2008   Bilateral   MELANOMA EXCISION  12/13/2015   Located behind right ear   MELANOMA EXCISION WITH SENTINEL LYMPH NODE BIOPSY Left 01/15/2013   Procedure: EXCISION LEFT LEG MELANOMA WITH LEFT INGUINAL SENTINEL NODE BIOPSY;  Surgeon: Emelia Loron, MD;  Location: [facility];  Service: General;  Laterality: Left;   TONSILLECTOMY  age 34   TOTAL HIP ARTHROPLASTY      Family History  Problem Relation Age of Onset   Heart attack Mother        < 15   Dementia Mother    Heart attack Father 72   COPD Sister    Breast cancer Sister    Colon cancer Neg Hx    Colon polyps Neg Hx    Esophageal cancer Neg Hx    Rectal cancer Neg Hx    Stomach cancer Neg Hx     Allergies  Allergen Reactions   Allopurinol     Itching, redness, scaling to his skin and swelling in the ankle   Atorvastatin    Citalopram    Ezetimibe-Simvastatin     REACTION: up cpk   Fluoxetine Hcl    Methotrexate Derivatives Other (See Comments)   Henderson [place] [Apremilast]    Repatha [Evolocumab]    Rosuvastatin     REACTION: up cpk   Venlafaxine    Seroquel [Quetiapine Fumarate] Palpitations    Racing thoughts and restless sleep   Trazodone  And Nefazodone Palpitations    Current Outpatient Medications on File Prior to Visit  Medication Sig Dispense Refill   amLODipine (NORVASC) 10 MG tablet TAKE 1 TABLET BY MOUTH EVERY DAY 30 tablet 11   clindamycin (CLEOCIN T) 1 % external solution Apply 1 Application topically daily.     fluorouracil (EFUDEX) 5 % cream      hydrOXYzine (  ATARAX/VISTARIL) 25 MG tablet Take 25 mg by mouth at bedtime.     imiquimod (ALDARA) 5 % cream SMARTSIG:1 Topical Daily PRN     mirtazapine (REMERON) 30 MG tablet TAKE 1 TABLET BY MOUTH EVERYDAY AT BEDTIME 90 tablet 0   predniSONE (DELTASONE) 10 MG tablet Take as needed for gout flares 20 tablet 0   probenecid (BENEMID) 500 MG tablet Take 1 tablet (500 mg total) by mouth 2 (two) times daily. 180 tablet 1   telmisartan (MICARDIS) 80 MG tablet Take 1 tablet (80 mg total) by mouth daily. 90 tablet 3   No current facility-administered medications on file prior to visit.    BP (!) 136/90   Pulse 63   Temp 98.4 F (36.9 C) (Oral)   Ht 5' 10.5" (1.791 m)   Wt 194 lb (88 kg)   SpO2 97%   BMI 27.44 kg/m       Objective:   Physical Exam Vitals and nursing note reviewed.  Constitutional:      General: He is not in acute distress.    Appearance: Normal appearance. He is not ill-appearing.  HENT:     Head: Normocephalic and atraumatic.     Right Ear: Tympanic membrane, ear canal and external ear normal. There is no impacted cerumen.     Left Ear: Tympanic membrane, ear canal and external ear normal. There is no impacted cerumen.     Nose: Nose normal. No congestion or rhinorrhea.     Mouth/Throat:     Mouth: Mucous membranes are moist.     Pharynx: Oropharynx is clear.  Eyes:     Extraocular Movements: Extraocular movements intact.     Conjunctiva/sclera: Conjunctivae normal.     Pupils: Pupils are equal, round, and reactive to light.  Neck:     Vascular: No carotid bruit.  Cardiovascular:     Rate and Rhythm: Normal rate and regular rhythm.      Pulses: Normal pulses.     Heart sounds: No murmur heard.    No friction rub. No gallop.  Pulmonary:     Effort: Pulmonary effort is normal.     Breath sounds: Normal breath sounds.  Abdominal:     General: Abdomen is flat. Bowel sounds are normal. There is no distension.     Palpations: Abdomen is soft. There is no mass.     Tenderness: There is no abdominal tenderness. There is no guarding or rebound.     Hernia: No hernia is present.  Musculoskeletal:        General: Normal range of motion.     Cervical back: Normal range of motion and neck supple.  Lymphadenopathy:     Cervical: No cervical adenopathy.  Skin:    General: Skin is warm and dry.     Capillary Refill: Capillary refill takes less than 2 seconds.  Neurological:     General: No focal deficit present.     Mental Status: He is alert and oriented to person, place, and time.  Psychiatric:        Mood and Affect: Mood normal.        Behavior: Behavior normal.        Thought Content: Thought content normal.        Judgment: Judgment normal.        Assessment & Plan:  1. Routine general medical examination at a health care facility Today patient counseled on age appropriate routine health concerns for screening and prevention, each reviewed and  up to date or declined. Immunizations reviewed and up to date or declined. Labs ordered and reviewed. Risk factors for depression reviewed and negative. Hearing function and visual acuity are intact. ADLs screened and addressed as needed. Functional ability and level of safety reviewed and appropriate. Education, counseling and referrals performed based on assessed risks today. Patient provided with a copy of personalized plan for preventive services. - Follow up in one year or sooner if needed  2. Primary insomnia  - mirtazapine (REMERON) 30 MG tablet; TAKE 1 TABLET BY MOUTH EVERYDAY AT BEDTIME  Dispense: 90 tablet; Refill: 1  3. Essential hypertension - Controlled. No change  in medication  - CBC with Differential/Platelet; Future - Comprehensive metabolic panel; Future - Comprehensive metabolic panel - CBC with Differential/Platelet  4. Familial hypercholesteremia - Lipid panel was done recently by Dr. Rennis Golden.  - CBC with Differential/Platelet; Future - Comprehensive metabolic panel; Future - Comprehensive metabolic panel - CBC with Differential/Platelet  5. Prostate cancer screening  - PSA; Future - PSA  6. Chronic gout of right ankle, unspecified cause - Per Rheumatology  - CBC with Differential/Platelet; Future - Comprehensive metabolic panel; Future - Comprehensive metabolic panel - CBC with Differential/Platelet  7. Skin cancer, basal cell - Per [facility] dermatology   8. Psoriatic arthritis [facility]) - Per Rheumatology   Shirline Frees, NP

## 2022-12-05 NOTE — Telephone Encounter (Signed)
Patient has been identified as candidate for Leqvio  Benefits investigation enrollment completed on 11/27/22 -- late entry as was indexed to chart and not sent to nurse  Benefits investigation report notes the following:  Type of insurance: [employer] Max: [phone] / Met: $1875.70  Deductible: $3200  Co-insurance: 20%  PA required: Yes  PA phone number: not provided  Benefits Summary Details:   See above

## 2022-12-24 ENCOUNTER — Encounter: Payer: Self-pay | Admitting: Internal Medicine

## 2022-12-25 ENCOUNTER — Other Ambulatory Visit: Payer: Self-pay | Admitting: Internal Medicine

## 2022-12-25 DIAGNOSIS — E7849 Other hyperlipidemia: Secondary | ICD-10-CM

## 2022-12-25 MED ORDER — INCLISIRAN SODIUM 284 MG/1.5ML ~~LOC~~ SOSY
284.0000 mg | PREFILLED_SYRINGE | Freq: Once | SUBCUTANEOUS | Status: AC
Start: 2022-12-25 — End: ?

## 2022-12-27 ENCOUNTER — Telehealth: Payer: Self-pay | Admitting: Pharmacy Technician

## 2022-12-27 NOTE — Telephone Encounter (Signed)
Auth Submission: APPROVED Site of care: Site of care: [facility] Payer: aetna Medication & CPT/J Code(s) submitted: Leqvio (Inclisiran) [phone] Route of submission (phone, fax, portal): portal Phone # Fax # Auth type: Buy/Bill PB Units/visits requested: 2 Reference number: [id number] Approval from: 12/12/22 to 06/10/23      [facility] co-pay card: pending [employer] aware

## 2022-12-27 NOTE — Telephone Encounter (Signed)
Patient approved for Leqvio until 06/10/23 First injection 01/01/23

## 2023-01-01 ENCOUNTER — Ambulatory Visit (INDEPENDENT_AMBULATORY_CARE_PROVIDER_SITE_OTHER): Payer: 59

## 2023-01-01 VITALS — BP 146/82 | HR 71 | Temp 97.9°F | Resp 16 | Ht 71.0 in | Wt 192.2 lb

## 2023-01-01 DIAGNOSIS — E7801 Familial hypercholesterolemia: Secondary | ICD-10-CM

## 2023-01-01 MED ORDER — INCLISIRAN SODIUM 284 MG/1.5ML ~~LOC~~ SOSY
284.0000 mg | PREFILLED_SYRINGE | Freq: Once | SUBCUTANEOUS | Status: AC
  Administered 2023-01-01: 284 mg via SUBCUTANEOUS
  Filled 2023-01-01: qty 1.5

## 2023-01-01 NOTE — Patient Instructions (Signed)
 Inclisiran Injection What is this medication? INCLISIRAN (in kli SIR an) treats high cholesterol. It works by decreasing bad cholesterol (such as LDL) in your blood. Changes to diet and exercise are often combined with this medication. This medicine may be used for other purposes; ask your health care provider or pharmacist if you have questions. COMMON BRAND NAME(S): LEQVIO What should I tell my care team before I take this medication? They need to know if you have any of these conditions: An unusual or allergic reaction to inclisiran, other medications, foods, dyes, or preservatives Pregnant or trying to get pregnant Breast-feeding How should I use this medication? This medication is injected under the skin. It is given by your care team in a hospital or clinic setting. Talk to your care team about the use of this medication in children. Special care may be needed. Overdosage: If you think you have taken too much of this medicine contact a poison control center or emergency room at once. NOTE: This medicine is only for you. Do not share this medicine with others. What if I miss a dose? Keep appointments for follow-up doses. It is important not to miss your dose. Call your care team if you are unable to keep an appointment. What may interact with this medication? Interactions are not expected. This list may not describe all possible interactions. Give your health care provider a list of all the medicines, herbs, non-prescription drugs, or dietary supplements you use. Also tell them if you smoke, drink alcohol, or use illegal drugs. Some items may interact with your medicine. What should I watch for while using this medication? Visit your care team for regular checks on your progress. Tell your care team if your symptoms do not start to get better or if they get worse. You may need blood work while you are taking this medication. What side effects may I notice from receiving this  medication? Side effects that you should report to your care team as soon as possible: Allergic reactions--skin rash, itching, hives, swelling of the face, lips, tongue, or throat Side effects that usually do not require medical attention (report these to your care team if they continue or are bothersome): Joint pain Pain, redness, or irritation at injection site This list may not describe all possible side effects. Call your doctor for medical advice about side effects. You may report side effects to FDA at 1-800-FDA-1088. Where should I keep my medication? This medication is given in a hospital or clinic. It will not be stored at home. NOTE: This sheet is a summary. It may not cover all possible information. If you have questions about this medicine, talk to your doctor, pharmacist, or health care provider.  2024 Elsevier/Gold Standard (2021-11-17 00:00:00)

## 2023-01-01 NOTE — Progress Notes (Signed)
Diagnosis: Hyperlipidemia  Provider:  Chilton Greathouse MD  Procedure: Injection  Leqvio (inclisiran), Dose: 284 mg, Site: subcutaneous, Number of injections: 1  Post Care: Observation period completed  Discharge: Condition: Good, Destination: Home . AVS Declined  Performed by:  Nat Math, RN

## 2023-02-12 ENCOUNTER — Encounter: Payer: Self-pay | Admitting: Internal Medicine

## 2023-03-07 ENCOUNTER — Other Ambulatory Visit: Payer: Self-pay | Admitting: Internal Medicine

## 2023-04-01 ENCOUNTER — Encounter: Payer: Self-pay | Admitting: Internal Medicine

## 2023-04-03 ENCOUNTER — Ambulatory Visit (INDEPENDENT_AMBULATORY_CARE_PROVIDER_SITE_OTHER): Payer: 59

## 2023-04-03 VITALS — BP 143/79 | HR 65 | Temp 98.0°F | Resp 18 | Ht 71.0 in | Wt 195.2 lb

## 2023-04-03 DIAGNOSIS — E7801 Familial hypercholesterolemia: Secondary | ICD-10-CM | POA: Diagnosis not present

## 2023-04-03 MED ORDER — INCLISIRAN SODIUM 284 MG/1.5ML ~~LOC~~ SOSY
284.0000 mg | PREFILLED_SYRINGE | Freq: Once | SUBCUTANEOUS | Status: AC
Start: 2023-04-03 — End: 2023-04-03
  Administered 2023-04-03: 284 mg via SUBCUTANEOUS
  Filled 2023-04-03: qty 1.5

## 2023-04-03 NOTE — Progress Notes (Signed)
Diagnosis: Hyperlipidemia  Provider:  Chilton Greathouse MD  Procedure: Injection  Leqvio (inclisiran), Dose: 284 mg, Site: subcutaneous, Number of injections: 1  Post Care:  left arm injection  Discharge: Condition: Good, Destination: Home . AVS Declined  Performed by:  Rico Ala, LPN

## 2023-04-10 NOTE — Progress Notes (Signed)
Office Visit Note  Patient: Raymond Long             Date of Birth: 04/25/1961           MRN: [id number]             PCP: Shirline Frees, NP Referring: Shirline Frees, NP Visit Date: 04/19/2023   Subjective:  Follow-up (Doing good)   History of Present Illness:   Discussed the use of AI scribe software for clinical note transcription with the patient, who gave verbal consent to proceed.  History of Present Illness   Raymond Long is a 62 y.o. male here for follow up for gout and psoriatic arthritis currently on probenecid 500 mg once daily. He has been undergoing a series of biopsies and laser treatments for multiple basal cell cancers lately. They describe the post-procedure pain as 'extreme' but are committed to the treatment plan, which is expected to last a year or longer. The patient has been advised to take Aurovela, but has declined due to concerns about potential side effects including nausea, weight loss, diarrhea, hair loss, and loss of taste. They are diligent about daily application of SPF 70 sunscreen and are taking nicotinamide supplements.  They report that their feet, particularly the right foot, are their 'worst enemy.' They have noticed a line appearing on the big toe of the right foot, which is where the pain typically begins. They have not had any severe gout attacks recently that have required steroid treatment.  The patient has been receiving injections of Leqvio for hyperlipidemia, which they report is not causing any significant side effects. However, they have noticed a slight increase in discomfort in their right foot joint following the second injection.  The patient also has osteoarthritis, which has resulted in a digital mucinous cyst on one of their fingers. They report no pain from the cyst. They have been managing their arthritis symptoms with regular exercise, specifically using an elliptical machine at the gym three times a week.   Previous  HPI 10/19/2022 Raymond Long is a 62 y.o. male here for follow up for gout and psoriatic arthritis currently on probenecid 500 mg twice daily.  Since her last visit he has not had severe flareups occasionally gets pain usually at the right great toe or right heel sometimes with swelling affecting the left foot.  This usually gets better with some Aleve or has taken 5 mg prednisone for a few days with good benefit as needed.  Still has GI irritation with the probenecid.  He retired from his job which has been a big improvement in stress level but feels his physical activity during the increased free time is limited somewhat with arthritis.  Also noticing increase in bilateral hip pain and stiffness this is most bothersome at nighttime and throughout the morning it takes a while to feel like he has normal flexibility on both sides.  Also had increase in skin rashes especially a painful area on the side of the neck.  His dermatologist referred him to [facility] for complicated condition with extensive basal cell carcinomas.   Previous HPI 04/19/22 Raymond Long is a 62 y.o. male here for follow up for psoriatic arthritis and gout on otezla and probencid. He had one mild flare up start after eating red meat and took 1 day of prednisone no other flares. He is noticing more persistent hand stiffness without visible swelling. Left index finger swelling at the proximal phalanx more on dorsal  side for about 6 weeks. He was moving furniture and thinks this might have been an injury. Skin rashes are stable, few near areas acting up on his back temporarily improves with topical treatment.   Previous HPI 01/17/22 Raymond Long is a 61 y.o. male here for follow up for psoriatic arthritis and gout. Skin symptoms have been pretty stable since last visit while off otezla. Joints are a problem he had flare ups in the left great for and right ankle with severe swelling and pain lasting for days. He took high amounts of aleve  with benefit about 4-6 extra strength capsules daily. But the left toe flare did not improve we called in prednisone taper that improved within 48 hours.   Previous HPI 07/17/2021 Raymond Long is a 62 y.o. male here for follow up for psoriatic arthritis on otezla started recently and was suffering some increased GI symptoms from this.  His original amount GI side effects improved though still has somewhat loose stool on a daily basis.  However started to develop pounding or throbbing type headaches particularly in the mornings with this medication and feels fatigued.  He has noticed partial improvement of his skin in multiple areas including his entire torso and on his shins there is still one bad lesion on his left leg.  Joint pain has not improved but she still has a lot of pain and stiffness though not seeing any significant swelling.     Previous HPI 04/24/21 Raymond Long is a 62 y.o. male here for follow up for seronegative arthritis and gout off maintenance treatment due to suspected mucositis or pneumonitis with trial of methotrexate. He notices increasing pain and stiffness in his fingers without much swelling. Skin rashes without much change some increased peeling at scalp and ears without much erythema. He sustained a few bites from his dog to both hands during a recent trip for family holiday gathering in [place]. He has been applying neosporin to the affected areas mildly painful at this point largest cut on the right hand.   Previous HPI 10/26/20 Raymond Long is a 62 y.o. male here for follow up for his chronic joint pain of multiple sites and skin rashes.  He has continued on the gluten-free diet and thinks there is definitely some improvement with the skin disease while doing this. Laboratory testing at his initial visit showed a uric acid of 9.2 but was otherwise negative for acute inflammatory markers or rheumatoid arthritis serology or HLA-B27 allele.  X-ray imaging  showed a mild to moderate osteoarthritis in his hands and feet except for right first MTP joint substantially worse with chronic erosive gouty arthritis changes   Review of Systems  Constitutional:  Negative for fatigue.  HENT:  Negative for mouth sores and mouth dryness.   Eyes:  Positive for dryness.  Respiratory:  Negative for shortness of breath.   Cardiovascular:  Positive for chest pain and palpitations.  Gastrointestinal:  Negative for blood in stool, constipation and diarrhea.  Endocrine: Negative for increased urination.  Genitourinary:  Negative for involuntary urination.  Musculoskeletal:  Positive for joint pain, joint pain, joint swelling, myalgias, morning stiffness, muscle tenderness and myalgias. Negative for gait problem and muscle weakness.  Skin:  Positive for rash, hair loss and sensitivity to sunlight. Negative for color change.  Allergic/Immunologic: Negative for susceptible to infections.  Neurological:  Positive for headaches. Negative for dizziness.  Hematological:  Negative for swollen glands.  Psychiatric/Behavioral:  Negative for depressed  mood and sleep disturbance. The patient is not nervous/anxious.     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/2019   Insomnia 05/29/2017   OSA (obstructive sleep apnea) 05/29/2017   Gastroesophageal reflux with apnea 05/29/2017   Generalized headache 01/25/2017   Testicular pain, left 04/26/2016   Routine general medical examination at a health care facility 10/12/2015   Essential hypertension 09/19/2015   Malignant melanoma of skin of left thigh (HCC) 04/06/2013   Skin cancer,  basal cell 10/28/2012   Fasting hyperglycemia 10/28/2012   Other abnormal glucose 10/23/2012   GOUT 10/11/2008   Bilateral hip pain 06/15/2008   Hyperlipidemia 04/23/2008   CARPAL TUNNEL SYNDROME 04/23/2008   INGUINAL PAIN, LEFT 11/20/2006    Past Medical History:  Diagnosis Date   Arthralgia of wrist, left    Arthritis    psoriatic arthritis   Basal cell carcinoma    Cancer (HCC)    Skin   Erectile dysfunction    Gout    Foot   Hyperlipidemia    Hypertension    Insomnia    PONV (postoperative nausea and vomiting)     Family History  Problem Relation Age of Onset   Heart attack Mother        < 18   Dementia Mother    Heart attack Father 41   COPD Sister    Breast cancer Sister    Colon cancer Neg Hx    Colon polyps Neg Hx    Esophageal cancer Neg Hx    Rectal cancer Neg Hx    Stomach cancer Neg Hx    Past Surgical History:  Procedure Laterality Date   COLONOSCOPY     @ 4 due to rectal symptoms   HERNIA REPAIR  2008   Bilateral   MELANOMA EXCISION  12/13/2015   Located behind right ear   MELANOMA EXCISION WITH SENTINEL LYMPH NODE BIOPSY Left 01/15/2013   Procedure: EXCISION LEFT LEG MELANOMA WITH LEFT INGUINAL SENTINEL NODE BIOPSY;  Surgeon: Emelia Loron, MD;  Location: [facility];  Service: General;  Laterality: Left;   TONSILLECTOMY  age 52   TOTAL HIP ARTHROPLASTY     Social History   Social History Narrative   He works in Teacher, English as a foreign language   No kids       Likes to be outside and hike.          Immunization History  Administered Date(s) Administered   Influenza, Mdck, Trivalent,PF 6+ MOS(egg free) 01/03/2023   Influenza,inj,Quad PF,6+ Mos 01/25/2017, 04/09/2019, 02/02/2022   Moderna Sars-Covid-2 Vaccination 07/21/2019, 08/18/2019   PFIZER Comirnaty(Gray Top)Covid-19 Tri-Sucrose Vaccine 02/02/2022   Tdap 04/09/2019   Zoster Recombinant(Shingrix) 06/16/2021     Objective: Vital Signs: BP 139/75 (BP Location:  Left Arm, Patient Position: Sitting, Cuff Size: Normal)   Pulse 69   Resp 14   Ht 5\' 11"  (1.803 m)   Wt 197 lb (89.4 kg)   BMI 27.48 kg/m    Physical Exam Eyes:     Conjunctiva/sclera: Conjunctivae normal.  Cardiovascular:     Rate and Rhythm: Normal rate and regular rhythm.  Pulmonary:  Effort: Pulmonary effort is normal.     Breath sounds: Normal breath sounds.  Lymphadenopathy:     Cervical: No cervical adenopathy.  Skin:    General: Skin is warm and dry.     Findings: Rash present.     Comments: Multiple spots on extremties and torso, some hyperpigmented some erythematous, without surrounding induration  Neurological:     Mental Status: He is alert.  Psychiatric:        Mood and Affect: Mood normal.      Musculoskeletal Exam:  Shoulders full ROM no tenderness or swelling Elbows full ROM no tenderness or swelling Wrists full ROM no tenderness or swelling Left 3rd DIP small nontender cyst on dorsal side, associated nail indentation Knees full ROM no tenderness or swelling Ankles full ROM no tenderness or swelling Bony hypertrophy and deviation both MTPs, no swelling no erythema       Investigation: No additional findings.  Imaging: No results found.  Recent Labs: Lab Results  Component Value Date   WBC 6.0 11/30/2022   HGB 15.1 11/30/2022   PLT 173.0 11/30/2022   NA 139 11/30/2022   K 4.1 11/30/2022   CL 101 11/30/2022   CO2 28 11/30/2022   GLUCOSE 79 11/30/2022   BUN 17 11/30/2022   CREATININE 1.21 11/30/2022   BILITOT 0.7 11/30/2022   ALKPHOS 78 11/30/2022   AST 14 11/30/2022   ALT 15 11/30/2022   PROT 7.0 11/30/2022   ALBUMIN 4.6 11/30/2022   CALCIUM 9.6 11/30/2022   GFRAA 82 (L) 01/12/2013   QFTBGOLDPLUS NEGATIVE 04/24/2021    Speciality Comments: No specialty comments available.  Procedures:  No procedures performed Allergies: Allopurinol, Atorvastatin, Citalopram, Ezetimibe-simvastatin, Fluoxetine hcl, Methotrexate derivatives,  Otezla [apremilast], Repatha [evolocumab], Rosuvastatin, Venlafaxine, Seroquel [quetiapine fumarate], and Trazodone and nefazodone   Assessment / Plan:     Visit Diagnoses: Chronic gout of multiple sites, unspecified cause - 04/19/2022 Uric Acid 5.2  Gout No recent flares requiring steroid use. Probenecid use once daily. -Continue Probenecid as prescribed. -Check uric acid levels today. -Checking sed rate for inflammation activity, suspect is currently just OA -Renal function for continued use probenecid  Basal Cell Carcinoma Multiple lesions being treated with surgical excision and laser therapy. Patient declined Aurovela due to potential side effects. -Continue current treatment plan with dermatologist at [facility]. -Continue daily use of SPF 70 sunscreen. -Continue Nicotinamide (Vitamin B3) supplementation.  Vitamin D Deficiency Risk due to strict UV light avoidance strategy. -Check vitamin D level today -Start over-the-counter Vitamin D3 supplement, 1-2 thousand units daily.  Osteoarthritis Noted in the right foot, causing pain and limited mobility. -No change in management at this time. Consider referral to podiatrist if pain becomes constant or significantly impacts quality of life.  General Health Maintenance -Continue current vaccination schedule (COVID, flu, shingles). -Check bloodwork today.    Orders: Orders Placed This Encounter  Procedures   Uric acid   Sedimentation rate   VITAMIN D 25 Hydroxy (Vit-D Deficiency, Fractures)   COMPLETE METABOLIC PANEL WITH GFR   No orders of the defined types were placed in this encounter.    Follow-Up Instructions: Return in about 6 months (around 10/18/2023) for Gout on probenecid f/u 6mos.   Fuller Plan, MD  Note - This record has been created using [employer].  Chart creation errors have been sought, but may not always  have been located. Such creation errors do not reflect on  the standard of medical care.

## 2023-04-19 ENCOUNTER — Encounter: Payer: Self-pay | Admitting: Internal Medicine

## 2023-04-19 ENCOUNTER — Ambulatory Visit: Payer: 59 | Attending: Internal Medicine | Admitting: Internal Medicine

## 2023-04-19 VITALS — BP 139/75 | HR 69 | Resp 14 | Ht 71.0 in | Wt 197.0 lb

## 2023-04-19 DIAGNOSIS — M25551 Pain in right hip: Secondary | ICD-10-CM

## 2023-04-19 DIAGNOSIS — M1A09X0 Idiopathic chronic gout, multiple sites, without tophus (tophi): Secondary | ICD-10-CM

## 2023-04-19 DIAGNOSIS — Z5181 Encounter for therapeutic drug level monitoring: Secondary | ICD-10-CM

## 2023-04-19 DIAGNOSIS — L4050 Arthropathic psoriasis, unspecified: Secondary | ICD-10-CM | POA: Diagnosis not present

## 2023-04-19 DIAGNOSIS — E559 Vitamin D deficiency, unspecified: Secondary | ICD-10-CM | POA: Insufficient documentation

## 2023-04-19 DIAGNOSIS — M25552 Pain in left hip: Secondary | ICD-10-CM

## 2023-04-19 DIAGNOSIS — Z7952 Long term (current) use of systemic steroids: Secondary | ICD-10-CM | POA: Diagnosis not present

## 2023-04-19 DIAGNOSIS — C4491 Basal cell carcinoma of skin, unspecified: Secondary | ICD-10-CM

## 2023-04-19 NOTE — Patient Instructions (Signed)
Your small finger nodule and nail change is due to a digital mucinous cyst these are usually due to osteoarthritis of the underlying joint.  I am checking your uric acid and a complete metabolic panel for monitoring your gout and the probenecid.  I will also check vitamin D level to see if/how much supplementation this needs.

## 2023-04-20 LAB — COMPLETE METABOLIC PANEL WITH GFR
AG Ratio: 2.1 (calc) (ref 1.0–2.5)
ALT: 33 U/L (ref 9–46)
AST: 28 U/L (ref 10–35)
Albumin: 4.9 g/dL (ref 3.6–5.1)
Alkaline phosphatase (APISO): 84 U/L (ref 35–144)
BUN: 17 mg/dL (ref 7–25)
CO2: 28 mmol/L (ref 20–32)
Calcium: 10.3 mg/dL (ref 8.6–10.3)
Chloride: 102 mmol/L (ref 98–110)
Creat: 1.27 mg/dL (ref 0.70–1.35)
Globulin: 2.3 g/dL (ref 1.9–3.7)
Glucose, Bld: 93 mg/dL (ref 65–99)
Potassium: 4.2 mmol/L (ref 3.5–5.3)
Sodium: 140 mmol/L (ref 135–146)
Total Bilirubin: 0.7 mg/dL (ref 0.2–1.2)
Total Protein: 7.2 g/dL (ref 6.1–8.1)
eGFR: 64 mL/min/{1.73_m2} (ref >=60)

## 2023-04-20 LAB — VITAMIN D 25 HYDROXY (VIT D DEFICIENCY, FRACTURES): Vit D, 25-Hydroxy: 20 ng/mL — ABNORMAL LOW (ref 30–100)

## 2023-04-20 LAB — URIC ACID: Uric Acid, Serum: 7 mg/dL (ref 4.0–8.0)

## 2023-04-20 LAB — SEDIMENTATION RATE: Sed Rate: 6 mm/h (ref 0–20)

## 2023-05-13 ENCOUNTER — Other Ambulatory Visit: Payer: Self-pay

## 2023-06-05 ENCOUNTER — Telehealth: Payer: Self-pay

## 2023-06-05 NOTE — Telephone Encounter (Signed)
Auth Submission: APPROVED Site of care: Site of care: [facility] Payer: [employer] Medication & CPT/J Code(s) submitted: Leqvio (Inclisiran) [phone] Route of submission (phone, fax, portal): Fax Phone # Fax # [phone]  Auth type: Buy/Bill PB Units/visits requested: 284mg  x 2 doses Reference number: [id number] Approval from: 06/11/23 to 06/09/24

## 2023-06-28 ENCOUNTER — Encounter: Payer: Self-pay | Admitting: Internal Medicine

## 2023-06-30 ENCOUNTER — Other Ambulatory Visit: Payer: Self-pay | Admitting: Adult Health

## 2023-06-30 DIAGNOSIS — F5101 Primary insomnia: Secondary | ICD-10-CM

## 2023-07-01 NOTE — Telephone Encounter (Signed)
 [place], I have informed [facility] about the urgency of this matter. Our infusion team will reach out to the patient.

## 2023-07-02 ENCOUNTER — Encounter: Payer: Self-pay | Admitting: Adult Health

## 2023-07-02 DIAGNOSIS — F5101 Primary insomnia: Secondary | ICD-10-CM

## 2023-07-03 MED ORDER — MIRTAZAPINE 30 MG PO TABS: ORAL_TABLET | ORAL | 1 refills | Status: DC

## 2023-09-23 ENCOUNTER — Other Ambulatory Visit (HOSPITAL_COMMUNITY): Payer: Self-pay | Admitting: Adult Health

## 2023-09-26 LAB — NMR, LIPOPROFILE
Cholesterol, Total: 222 mg/dL — ABNORMAL HIGH (ref 100–199)
HDL Particle Number: 24.4 umol/L — ABNORMAL LOW (ref >=30.5)
HDL-C: 32 mg/dL — ABNORMAL LOW (ref >39)
LDL Particle Number: 2289 nmol/L — ABNORMAL HIGH (ref <1000)
LDL Size: 20 nm — ABNORMAL LOW (ref >20.5)
LDL-C (NIH Calc): 166 mg/dL — ABNORMAL HIGH (ref 0–99)
LP-IR Score: 70 — ABNORMAL HIGH (ref <=45)
Small LDL Particle Number: 1601 nmol/L — ABNORMAL HIGH (ref <=527)
Triglycerides: 129 mg/dL (ref 0–149)

## 2023-09-27 ENCOUNTER — Ambulatory Visit (HOSPITAL_BASED_OUTPATIENT_CLINIC_OR_DEPARTMENT_OTHER): Payer: Self-pay | Admitting: Internal Medicine

## 2023-10-02 ENCOUNTER — Ambulatory Visit (INDEPENDENT_AMBULATORY_CARE_PROVIDER_SITE_OTHER): Payer: 59

## 2023-10-02 VITALS — BP 147/83 | HR 63 | Temp 97.8°F | Resp 16 | Ht 71.0 in | Wt 191.6 lb

## 2023-10-02 DIAGNOSIS — E7801 Familial hypercholesterolemia: Secondary | ICD-10-CM | POA: Diagnosis not present

## 2023-10-02 MED ORDER — INCLISIRAN SODIUM 284 MG/1.5ML ~~LOC~~ SOSY
284.0000 mg | PREFILLED_SYRINGE | Freq: Once | SUBCUTANEOUS | Status: AC
  Administered 2023-10-02: 284 mg via SUBCUTANEOUS
  Filled 2023-10-02: qty 1.5

## 2023-10-02 NOTE — Progress Notes (Signed)
 Diagnosis: Hyperlipidemia  Provider:  Chilton Greathouse MD  Procedure: Injection  Leqvio (inclisiran), Dose: 284 mg, Site: subcutaneous, Number of injections: 1  Injection Site(s): Right arm   Discharge: Condition: Good, Destination: Home . AVS Declined  Performed by:  Nat Math, RN

## 2023-10-04 ENCOUNTER — Other Ambulatory Visit: Payer: Self-pay | Admitting: Internal Medicine

## 2023-10-04 DIAGNOSIS — M1A09X0 Idiopathic chronic gout, multiple sites, without tophus (tophi): Secondary | ICD-10-CM

## 2023-10-04 NOTE — Telephone Encounter (Signed)
 Last Fill: 08/02/2022  Next Visit: 10/18/2023  Last Visit: 04/19/2023  Dx: not menioned  Current Dose per office note on 04/19/2023: not mentioned  Okay to refill Prednisone ?

## 2023-10-04 NOTE — Progress Notes (Signed)
 Office Visit Note  Patient: Raymond Long             Date of Birth: Sep 12, 1960           MRN: [id number]             PCP: Merna Huxley, NP Referring: Merna Huxley, NP Visit Date: 10/18/2023   Subjective:  Follow-up (Patient states he was put on Leqvio  injections and they shot his uric acid up. )   Discussed the use of AI scribe software for clinical note transcription with the patient, who gave verbal consent to proceed.  History of Present Illness   Raymond Long is a 63 y.o. male here for follow up for gout and psoriatic arthritis currently on probenecid  500 mg once daily. He presents with a severe gout flare following Letvio injection he is questioning if drug related.  He experienced a severe gout flare following his second Letvio injection, occurring within 48 hours of administration. The flare was extremely painful, affecting both feet, particularly the left, and rendered him unable to walk or wear shoes due to swelling and pain. Prior to the flare, he was on Probenecid  once daily, which he felt was managing his gout well. During the flare, he self-administered leftover prednisone , starting with three tablets on the first day, tapering down over several days. Despite this, the inflammation was severe, and it took several days for the symptoms to improve.  He increased his Probenecid  to twice daily since this attack. He has not noticed any major side effects besides an increase in nocturia with the evening dose.  He has a history of hypercholesterolemia, with cholesterol levels previously over 300 mg/dL. Letvio was effective in reducing his cholesterol to just over 200 mg/dL. He has a history of intolerance to statins due to musculoskeletal side effects.  He has previously tried allopurinol  and uloric  for gout management but did not tolerate them well mostly from GI side effects. He also reports a history of adverse reactions to colchicine , which causes gastrointestinal  upset.  Socially, he is retired and enjoys gardening, which he finds beneficial for his well-being. He has a large garden where he grows a variety of vegetables and herbs. He has a large yard and employs someone to assist with mowing due to its size.     Previous HPI 04/19/2023 Raymond Long is a 63 y.o. male here for follow up for gout and psoriatic arthritis currently on probenecid  500 mg once daily. He has been undergoing a series of biopsies and laser treatments for multiple basal cell cancers lately. They describe the post-procedure pain as 'extreme' but are committed to the treatment plan, which is expected to last a year or longer. The patient has been advised to take Aurovela, but has declined due to concerns about potential side effects including nausea, weight loss, diarrhea, hair loss, and loss of taste. They are diligent about daily application of SPF 70 sunscreen and are taking nicotinamide supplements.   They report that their feet, particularly the right foot, are their 'worst enemy.' They have noticed a line appearing on the big toe of the right foot, which is where the pain typically begins. They have not had any severe gout attacks recently that have required steroid treatment.   The patient has been receiving injections of Leqvio  for hyperlipidemia, which they report is not causing any significant side effects. However, they have noticed a slight increase in discomfort in their right foot joint following the second  injection.   The patient also has osteoarthritis, which has resulted in a digital mucinous cyst on one of their fingers. They report no pain from the cyst. They have been managing their arthritis symptoms with regular exercise, specifically using an elliptical machine at the gym three times a week.    Previous HPI 10/19/2022 Raymond Long is a 63 y.o. male here for follow up for gout and psoriatic arthritis currently on probenecid  500 mg twice daily.  Since her  last visit he has not had severe flareups occasionally gets pain usually at the right great toe or right heel sometimes with swelling affecting the left foot.  This usually gets better with some Aleve or has taken 5 mg prednisone  for a few days with good benefit as needed.  Still has GI irritation with the probenecid .  He retired from his job which has been a big improvement in stress level but feels his physical activity during the increased free time is limited somewhat with arthritis.  Also noticing increase in bilateral hip pain and stiffness this is most bothersome at nighttime and throughout the morning it takes a while to feel like he has normal flexibility on both sides.  Also had increase in skin rashes especially a painful area on the side of the neck.  His dermatologist referred him to [facility] for complicated condition with extensive basal cell carcinomas.   Previous HPI 04/19/22 Raymond Long is a 62 y.o. male here for follow up for psoriatic arthritis and gout on otezla  and probencid. He had one mild flare up start after eating red meat and took 1 day of prednisone  no other flares. He is noticing more persistent hand stiffness without visible swelling. Left index finger swelling at the proximal phalanx more on dorsal side for about 6 weeks. He was moving furniture and thinks this might have been an injury. Skin rashes are stable, few near areas acting up on his back temporarily improves with topical treatment.   Previous HPI 01/17/22 Raymond MOURE is a 63 y.o. male here for follow up for psoriatic arthritis and gout. Skin symptoms have been pretty stable since last visit while off otezla . Joints are a problem he had flare ups in the left great for and right ankle with severe swelling and pain lasting for days. He took high amounts of aleve with benefit about 4-6 extra strength capsules daily. But the left toe flare did not improve we called in prednisone  taper that improved within 48 hours.    Previous HPI 07/17/2021 Raymond Long is a 63 y.o. male here for follow up for psoriatic arthritis on otezla  started recently and was suffering some increased GI symptoms from this.  His original amount GI side effects improved though still has somewhat loose stool on a daily basis.  However started to develop pounding or throbbing type headaches particularly in the mornings with this medication and feels fatigued.  He has noticed partial improvement of his skin in multiple areas including his entire torso and on his shins there is still one bad lesion on his left leg.  Joint pain has not improved but she still has a lot of pain and stiffness though not seeing any significant swelling.     Previous HPI 04/24/21 LOREN SAWAYA is a 63 y.o. male here for follow up for seronegative arthritis and gout off maintenance treatment due to suspected mucositis or pneumonitis with trial of methotrexate . He notices increasing pain and stiffness in his fingers without  much swelling. Skin rashes without much change some increased peeling at scalp and ears without much erythema. He sustained a few bites from his dog to both hands during a recent trip for family holiday gathering in [place] . He has been applying neosporin to the affected areas mildly painful at this point largest cut on the right hand.   Previous HPI 10/26/20 OSWALD POTT is a 63 y.o. male here for follow up for his chronic joint pain of multiple sites and skin rashes.  He has continued on the gluten-free diet and thinks there is definitely some improvement with the skin disease while doing this. Laboratory testing at his initial visit showed a uric acid of 9.2 but was otherwise negative for acute inflammatory markers or rheumatoid arthritis serology or HLA-B27 allele.  X-ray imaging showed a mild to moderate osteoarthritis in his hands and feet except for right first MTP joint substantially worse with chronic erosive gouty arthritis  changes   Review of Systems  Constitutional:  Negative for fatigue.  HENT:  Negative for mouth sores and mouth dryness.   Eyes:  Negative for dryness.  Respiratory:  Positive for shortness of breath.   Cardiovascular:  Positive for chest pain and palpitations.  Gastrointestinal:  Positive for diarrhea. Negative for blood in stool and constipation.  Endocrine: Positive for increased urination.  Genitourinary:  Negative for involuntary urination.  Musculoskeletal:  Positive for joint pain, gait problem, joint pain, joint swelling, myalgias, muscle weakness, morning stiffness, muscle tenderness and myalgias.  Skin:  Positive for sensitivity to sunlight. Negative for color change, rash and hair loss.  Allergic/Immunologic: Negative for susceptible to infections.  Neurological:  Positive for dizziness and headaches.  Hematological:  Negative for swollen glands.  Psychiatric/Behavioral:  Negative for depressed mood and sleep disturbance. The patient is not nervous/anxious.     PMFS History:  Patient Active Problem List   Diagnosis Date Noted  . Vitamin D  deficiency 04/19/2023  . Familial hypercholesterolemia 11/23/2022  . Dog bite 04/24/2021  . High risk medication use 04/24/2021  . Cough 12/26/2020  . Psoriatic arthritis (HCC) 10/26/2020  . Rash and other nonspecific skin eruption 10/06/2020  . History of revision of total replacement of left hip joint 01/05/2019  . Insomnia 05/29/2017  . OSA (obstructive sleep apnea) 05/29/2017  . Gastroesophageal reflux with apnea 05/29/2017  . Generalized headache 01/25/2017  . Testicular pain, left 04/26/2016  . Routine general medical examination at a health care facility 10/12/2015  . Essential hypertension 09/19/2015  . Malignant melanoma of skin of left thigh (HCC) 04/06/2013  . Skin cancer, basal cell 10/28/2012  . Fasting hyperglycemia 10/28/2012  . Other abnormal glucose 10/23/2012  . GOUT 10/11/2008  . Bilateral hip pain 06/15/2008   . Hyperlipidemia 04/23/2008  . CARPAL TUNNEL SYNDROME 04/23/2008  . INGUINAL PAIN, LEFT 11/20/2006    Past Medical History:  Diagnosis Date  . Arthralgia of wrist, left   . Arthritis    psoriatic arthritis  . Basal cell carcinoma   . 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 Erectile dysfunction   . Gout    Foot  . Hyperlipidemia   . Hypertension   . Insomnia   . PONV (postoperative nausea and vomiting)     Family History  Problem Relation Age of Onset  . Heart attack Mother        < 65  . Dementia Mother   . Heart attack Father 51  . COPD Sister   . Breast cancer  Sister   . Colon cancer Neg Hx   . Colon polyps Neg Hx   . Esophageal cancer Neg Hx   . Rectal cancer Neg Hx   . Stomach cancer Neg Hx    Past Surgical History:  Procedure Laterality Date  . COLONOSCOPY     @ 49 due to rectal symptoms  . HERNIA REPAIR  2008   Bilateral  . 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 MELANOMA EXCISION WITH SENTINEL LYMPH NODE BIOPSY Left 01/15/2013   Procedure: EXCISION LEFT LEG MELANOMA WITH LEFT INGUINAL SENTINEL NODE BIOPSY;  Surgeon: Donnice Bury, MD;  Location: [facility];  Service: General;  Laterality: Left;  . TONSILLECTOMY  age 75  . TOTAL HIP ARTHROPLASTY     Social History   Social History Narrative   He works in Teacher, English as a foreign language   No kids       Likes to be outside and hike.          Immunization History  Administered Date(s) Administered  . Influenza, Mdck, Trivalent,PF 6+ MOS(egg free) 01/03/2023  . Influenza,inj,Quad PF,6+ Mos 01/25/2017, 04/09/2019, 02/02/2022  . Moderna Sars-Covid-2 Vaccination 07/21/2019, 08/18/2019  . PFIZER Comirnaty(Gray Top)Covid-19 Tri-Sucrose Vaccine 02/02/2022  . Tdap 04/09/2019  . Zoster Recombinant(Shingrix) 06/16/2021     Objective: Vital Signs: BP 125/76 (BP Location: Left Arm, Patient Position: Sitting, Cuff Size: Large)   Pulse 69   Resp 14   Ht 5' 11 (1.803 m)   Wt 190 lb  (86.2 kg)   BMI 26.50 kg/m    Physical Exam  Eyes:     Conjunctiva/sclera: Conjunctivae normal.    Cardiovascular:     Rate and Rhythm: Normal rate and regular rhythm.  Pulmonary:     Effort: Pulmonary effort is normal.     Breath sounds: Normal breath sounds.   Skin:    General: Skin is warm and dry.     Findings: Rash present.     Comments: Multiple spots on extremties and torso, some hyperpigmented some erythematous, without surrounding induration    Neurological:     Mental Status: He is alert.   Psychiatric:        Mood and Affect: Mood normal.     Musculoskeletal Exam:  Shoulders full ROM no tenderness or swelling Elbows full ROM no tenderness or swelling Wrists full ROM no tenderness or swelling Left 3rd DIP small nontender cyst on dorsal side, associated nail indentation Knees full ROM no tenderness or swelling Ankles full ROM right ankle nodule mild tenderness Right midfoot joint pain and swelling present Bony hypertrophy and deviation both MTPs, there is mild overlying swelling and tenderness to pressure worse on medial border   Investigation: No additional findings.  Imaging: No results found.  Recent Labs: Lab Results  Component Value Date   WBC 6.0 11/30/2022   HGB 15.1 11/30/2022   PLT 173.0 11/30/2022   NA 139 10/18/2023   K 4.5 10/18/2023   CL 101 10/18/2023   CO2 28 10/18/2023   GLUCOSE 90 10/18/2023   BUN 22 10/18/2023   CREATININE 1.36 (H) 10/18/2023   BILITOT 0.6 10/18/2023   ALKPHOS 78 11/30/2022   AST 19 10/18/2023   ALT 26 10/18/2023   PROT 7.0 10/18/2023   ALBUMIN 4.6 11/30/2022   CALCIUM  9.4 10/18/2023   GFRAA 82 (L) 01/12/2013   QFTBGOLDPLUS NEGATIVE 04/24/2021    Speciality Comments: No specialty comments available.  Procedures:  No procedures  performed Allergies: Allopurinol , Atorvastatin, Citalopram, Ezetimibe-simvastatin, Fluoxetine hcl, Leqvio  [inclisiran sodium ], Methotrexate  derivatives, Otezla  [apremilast ],  Repatha  [evolocumab ], Rosuvastatin , Venlafaxine, Seroquel  [quetiapine  fumarate], and Trazodone  and nefazodone   Assessment / Plan:     Visit Diagnoses: Chronic gout of multiple sites, unspecified cause - 04/19/2023 Uric Acid 7.0 - Plan: probenecid  (BENEMID ) 500 MG tablet, Sedimentation rate, Uric acid Recent severe flare, maybe related post-Letvio injection I am not aware of any particular relationship. Probenecid  increased since flare. Considering febuxostat  but also had some GI intolerance with that, and cardiovascular risks noted. - Check uric acid levels today and sed rate for disease activity monitoring with recent flare - Continue probenecid  500 mg twice daily. - Recheck kidney function due to increased probenecid  dosage.  Medication monitoring encounter - probenecid  500 mg once daily. - Plan: Comprehensive metabolic panel with GFR - Checking CMP for medication monitoring after increase in probenecid  with some previous mild abnormalities  Skin cancer, basal cell - Continue current treatment plan with dermatologist at [facility]. Continue daily use of SPF 70 sunscreen. Continue Nicotinamide (Vitamin B3) supplementation.  Vitamin D  deficiency - Over-the-counter Vitamin D3 supplement, 1-2 thousand units daily.  Osteoarthritis Osteoarthritis in feet exacerbated by gout flare, causing swelling and tenderness in midfoot joints.  Hyperlipidemia Managed with Letvio, significantly reduced cholesterol. Statins not tolerated. Letvio may contribute to gout flares.   Orders: Orders Placed This Encounter  Procedures  . Sedimentation rate  . Uric acid  . Comprehensive metabolic panel with GFR   Meds ordered this encounter  Medications  . probenecid  (BENEMID ) 500 MG tablet    Sig: Take 1 tablet (500 mg total) by mouth 2 (two) times daily.    Dispense:  180 tablet    Refill:  1     Follow-Up Instructions: Return in about 6 months (around 04/18/2024) for Gout on probencidf/u  6mos.   Lonni LELON Ester, MD  Note - This record has been created using [employer].  Chart creation errors have been sought, but may not always  have been located. Such creation errors do not reflect on  the standard of medical care.

## 2023-10-18 ENCOUNTER — Encounter: Payer: Self-pay | Admitting: Internal Medicine

## 2023-10-18 ENCOUNTER — Ambulatory Visit: Payer: 59 | Attending: Internal Medicine | Admitting: Internal Medicine

## 2023-10-18 VITALS — BP 125/76 | HR 69 | Resp 14 | Ht 71.0 in | Wt 190.0 lb

## 2023-10-18 DIAGNOSIS — E559 Vitamin D deficiency, unspecified: Secondary | ICD-10-CM

## 2023-10-18 DIAGNOSIS — Z5181 Encounter for therapeutic drug level monitoring: Secondary | ICD-10-CM

## 2023-10-18 DIAGNOSIS — C4491 Basal cell carcinoma of skin, unspecified: Secondary | ICD-10-CM | POA: Diagnosis not present

## 2023-10-18 DIAGNOSIS — M1A09X0 Idiopathic chronic gout, multiple sites, without tophus (tophi): Secondary | ICD-10-CM | POA: Diagnosis not present

## 2023-10-18 MED ORDER — PROBENECID 500 MG PO TABS: 500.0000 mg | ORAL_TABLET | Freq: Two times a day (BID) | ORAL | 1 refills | Status: DC

## 2023-10-19 LAB — COMPREHENSIVE METABOLIC PANEL WITH GFR
AG Ratio: 1.8 (calc) (ref 1.0–2.5)
ALT: 26 U/L (ref 9–46)
AST: 19 U/L (ref 10–35)
Albumin: 4.5 g/dL (ref 3.6–5.1)
Alkaline phosphatase (APISO): 88 U/L (ref 35–144)
BUN/Creatinine Ratio: 16 (calc) (ref 6–22)
BUN: 22 mg/dL (ref 7–25)
CO2: 28 mmol/L (ref 20–32)
Calcium: 9.4 mg/dL (ref 8.6–10.3)
Chloride: 101 mmol/L (ref 98–110)
Creat: 1.36 mg/dL — ABNORMAL HIGH (ref 0.70–1.35)
Globulin: 2.5 g/dL (ref 1.9–3.7)
Glucose, Bld: 90 mg/dL (ref 65–99)
Potassium: 4.5 mmol/L (ref 3.5–5.3)
Sodium: 139 mmol/L (ref 135–146)
Total Bilirubin: 0.6 mg/dL (ref 0.2–1.2)
Total Protein: 7 g/dL (ref 6.1–8.1)
eGFR: 58 mL/min/{1.73_m2} — ABNORMAL LOW (ref >=60)

## 2023-10-19 LAB — SEDIMENTATION RATE: Sed Rate: 6 mm/h (ref 0–20)

## 2023-10-19 LAB — URIC ACID: Uric Acid, Serum: 7.8 mg/dL (ref 4.0–8.0)

## 2023-10-24 ENCOUNTER — Telehealth (HOSPITAL_BASED_OUTPATIENT_CLINIC_OR_DEPARTMENT_OTHER): Payer: Self-pay

## 2023-10-24 ENCOUNTER — Ambulatory Visit (INDEPENDENT_AMBULATORY_CARE_PROVIDER_SITE_OTHER): Admitting: Internal Medicine

## 2023-10-24 VITALS — BP 118/62 | HR 83 | Ht 71.0 in | Wt 190.0 lb

## 2023-10-24 DIAGNOSIS — Z79899 Other long term (current) drug therapy: Secondary | ICD-10-CM | POA: Diagnosis not present

## 2023-10-24 DIAGNOSIS — M609 Myositis, unspecified: Secondary | ICD-10-CM

## 2023-10-24 DIAGNOSIS — E7801 Familial hypercholesterolemia: Secondary | ICD-10-CM

## 2023-10-24 DIAGNOSIS — L4050 Arthropathic psoriasis, unspecified: Secondary | ICD-10-CM

## 2023-10-24 DIAGNOSIS — T466X5D Adverse effect of antihyperlipidemic and antiarteriosclerotic drugs, subsequent encounter: Secondary | ICD-10-CM

## 2023-10-24 MED ORDER — PITAVASTATIN CALCIUM 4 MG PO TABS: 1.0000 | ORAL_TABLET | Freq: Every day | ORAL | 3 refills | Status: DC

## 2023-10-24 NOTE — Progress Notes (Signed)
 LIPID CLINIC CONSULT NOTE  Chief Complaint:  Follow-up dyslipidemia  Primary Care Physician: Alto Atta, NP  Primary Cardiologist:  None  HPI:  Raymond Long is a 63 y.o. male who is being seen today for the evaluation of dyslipidemia at the request of Alto Atta, NP.  This is a pleasant 63 year old male kindly referred for evaluation management of dyslipidemia.  He reports longstanding issues with cholesterol that has been difficult to control due to side effects with the statins.  He has previously tried atorvastatin, rosuvastatin  and Vytorin, all of which caused myalgias and increase in his CKs.  He does report family history of heart disease in both parents in their 105s.  His untreated cholesterol has been well over 190 and is up to the 200s in the past.  More recently his total cholesterol was 256, triglycerides 147, HDL 28 and LDL 199.  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  He also has a history of psoriatic arthritis on treatment and this is highly significant for an at risk factor for heart disease, typically conferring up to 7 times the risk of heart disease compared to an on psoriatic arthritis patient.  11/23/2022  Raymond Long is seen today in follow-up. Unfortunately he had significant side effects on Repatha , including severe chest pain, palpitations, headaches, GERD, skin rash and vivid dreams. This apparently completely dissipated when he stopped the medication. His options are quite limited since he cannot tolerate the statins. He may be able to tolerate zetia - so remaining options may include Leqvio  and Nexlitol (Nexlizet).  10/24/2023  Raymond Long is seen today in follow-up.  He has been on Leqvio  although after his third dose noted a marked increase in his uric acid level by his rheumatologist.  I do not think this is related.  There is no real evidence that Leqvio  increase his uric acid levels although significant turnover of cholesterol  might be related to that.  He has had a marked reduction in his lipids and seems to be otherwise tolerating the medication well.  His LDL particle number went down from [phone] with an LDL now of 166 (down from 540).  Because of how high his cholesterol was in the past is highly suspicious that he has a familial hyperlipidemia.  He reports both of his parents have high cholesterol and early heart disease.  He has not previously had genetic testing although may benefit from this.  He has few other options for therapy and I would highly advise him to remain on the Leqvio .  He has previously been intolerant to statins and ezetimibe but said he was willing to try another statin since he needs further lipid-lowering and ideally an LDL below 70.  PMHx:  Past Medical History:  Diagnosis Date   Arthralgia of wrist, left    Arthritis    psoriatic arthritis   Basal cell carcinoma    Cancer (HCC)    Skin   Erectile dysfunction    Gout    Foot   Hyperlipidemia    Hypertension    Insomnia    PONV (postoperative nausea and vomiting)     Past Surgical History:  Procedure Laterality Date   COLONOSCOPY     @ 49 due to rectal symptoms   HERNIA REPAIR  2008   Bilateral   MELANOMA EXCISION  12/13/2015   Located behind right ear   MELANOMA EXCISION WITH SENTINEL LYMPH NODE BIOPSY Left 01/15/2013   Procedure: EXCISION LEFT LEG MELANOMA  WITH LEFT INGUINAL SENTINEL NODE BIOPSY;  Surgeon: Enid Harry, MD;  Location: [facility];  Service: General;  Laterality: Left;   TONSILLECTOMY  age 83   TOTAL HIP ARTHROPLASTY      FAMHx:  Family History  Problem Relation Age of Onset   Heart attack Mother        < 5   Dementia Mother    Heart attack Father 37   COPD Sister    Breast cancer Sister    Colon cancer Neg Hx    Colon polyps Neg Hx    Esophageal cancer Neg Hx    Rectal cancer Neg Hx    Stomach cancer Neg Hx     SOCHx:   reports that he quit smoking about 22 years ago.  His smoking use included cigarettes. He started smoking about 42 years ago. He has a 40 pack-year smoking history. He has never been exposed to tobacco smoke. He has never used smokeless tobacco. He reports that he does not drink alcohol and does not use drugs.  ALLERGIES:  Allergies  Allergen Reactions   Allopurinol  Other (See Comments)    Itching, redness, scaling to his skin and swelling in the ankle   Atorvastatin    Citalopram    Ezetimibe-Simvastatin     REACTION: up cpk   Fluoxetine Hcl    Leqvio  [Inclisiran Sodium ]     Patient states it made his uric acid go up dramatically   Methotrexate  Derivatives Other (See Comments)   Otezla  [Apremilast ]    Repatha  [Evolocumab ]    Rosuvastatin      REACTION: up cpk   Venlafaxine    Seroquel  [Quetiapine  Fumarate] Palpitations    Racing thoughts and restless sleep   Trazodone  And Nefazodone Palpitations    ROS: Pertinent items noted in HPI and remainder of comprehensive ROS otherwise negative.  HOME MEDS: Current Outpatient Medications on File Prior to Visit  Medication Sig Dispense Refill   amLODipine  (NORVASC ) 10 MG tablet TAKE 1 TABLET BY MOUTH EVERY DAY 30 tablet 11   clindamycin (CLEOCIN T) 1 % external solution Apply 1 Application topically daily.     Cyanocobalamin (VITAMIN B-12 PO) Take by mouth.     fluorouracil (EFUDEX) 5 % cream      hydrOXYzine (ATARAX/VISTARIL) 25 MG tablet Take 25 mg by mouth at bedtime.     imiquimod (ALDARA) 5 % cream SMARTSIG:1 Topical Daily PRN     ketoconazole (NIZORAL) 2 % shampoo Apply topically Two (2) times a week to beard.     mirtazapine  (REMERON ) 30 MG tablet TAKE 1 TABLET BY MOUTH EVERYDAY AT BEDTIME 90 tablet 1   predniSONE  (DELTASONE ) 10 MG tablet TAKE AS NEEDED FOR GOUT FLARES 20 tablet 0   probenecid  (BENEMID ) 500 MG tablet Take 1 tablet (500 mg total) by mouth 2 (two) times daily. 180 tablet 1   telmisartan  (MICARDIS ) 80 MG tablet TAKE 1 TABLET BY MOUTH EVERY DAY 90 tablet 2    Current Facility-Administered Medications on File Prior to Visit  Medication Dose Route Frequency Provider Last Rate Last Admin   inclisiran (LEQVIO ) injection 284 mg  284 mg Subcutaneous Once         LABS/IMAGING: No results found for this or any previous visit (from the past 48 hours). No results found.  LIPID PANEL:    Component Value Date/Time   CHOL 301 (H) 10/27/2021 1333   CHOL 251 (H) 11/02/2014 1026   TRIG 151.0 (H) 10/27/2021 1333   TRIG  186 (H) 11/02/2014 1026   TRIG 89 04/23/2006 0959   HDL 38.60 (L) 10/27/2021 1333   HDL 29 (L) 11/02/2014 1026   CHOLHDL 8 10/27/2021 1333   VLDL 30.2 10/27/2021 1333   LDLCALC 232 (H) 10/27/2021 1333   LDLCALC 185 (H) 11/02/2014 1026   LDLDIRECT 176.9 06/09/2008 0958    WEIGHTS: Wt Readings from Last 3 Encounters:  10/24/23 190 lb (86.2 kg)  10/18/23 190 lb (86.2 kg)  10/02/23 191 lb 9.6 oz (86.9 kg)    VITALS: BP 118/62 (BP Location: Left Arm, Patient Position: Sitting, Cuff Size: Normal)   Pulse 83   Ht 5' 11 (1.803 m)   Wt 190 lb (86.2 kg)   BMI 26.50 kg/m   EXAM: Deferred  EKG: N/A  ASSESSMENT: Probable familial hyperlipidemia, based on Simon Broome criteria Longstanding history of LDL cholesterol greater than 190 Premature coronary disease in both parents in their 60s 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   Raymond Long has had a good response to Leqvio .  Although he had an increase in uric acid I do not believe this is primarily due to the medication but could be due to excessive lipid-lowering.  Nonetheless that he has few if any other options and is at high risk of cardiovascular events including heart attack and stroke and therefore really needs to remain on this medication.  I would advise him to talk with his rheumatologist about other uric acid lowering therapies that could be considered in addition to the probenecid .  We discussed the  possibility of rare therapy such as Evkeeza, however he would need some evidence of homozygous familial hyperlipidemia or compound heterozygote with genetic testing to qualify for that.  He is agreeable to genetic testing today which we will arrange for.  Otherwise he is also interested in trying additional statin.  We might have some success with Livalo 4 mg daily.  Will need to check a metabolic profile and CK within a month and then if these are stable repeat lipids in 3 to 4 months.  Follow-up afterwards.  Hazle Lites, MD, [facility], FNLA, FACP  [place]  [facility]  Medical Director of the Advanced Lipid Disorders &  Cardiovascular Risk Reduction Clinic Diplomate of the American Board of Clinical Lipidology Attending Cardiologist  Direct Dial: [phone]  Fax: [phone]  Website:  [email or web address] Shametra Cumberland 10/24/2023, 3:05 PM

## 2023-10-24 NOTE — Telephone Encounter (Signed)
 Genetic test for Familial Hypercholesteremia ordered ([facility]) Cheek swab completed in office Specimen and necessary paperwork mailed. ID: [id number]

## 2023-10-24 NOTE — Patient Instructions (Addendum)
 Medication Instructions:  ADD Pitavastatin (LIVALO) 4 mg daily *If you need a refill on your cardiac medications before your next appointment, please call your pharmacy*  Lab Work: CMET and CK in 2 weeks (Early JULY) FASTING Lipids- NMR in 3 months If you have labs (blood work) drawn today and your tests are completely normal, you will receive your results only by: MyChart Message (if you have MyChart) OR A paper copy in the mail If you have any lab test that is abnormal or we need to change your treatment, we will call you to review the results.  Follow-Up: At [facility], you and your health needs are our priority.  As part of our continuing mission to provide you with exceptional heart care, our providers are all part of one team.  This team includes your primary Cardiologist (physician) and Advanced Practice Providers or APPs (Physician Assistants and Nurse Practitioners) who all work together to provide you with the care you need, when you need it.  Your next appointment:   In 6 months with Dr. Maximo Spar  Dr. Maximo Spar will contact you in 2-3 weeks with Genetic test results

## 2023-10-28 ENCOUNTER — Other Ambulatory Visit: Payer: Self-pay | Admitting: Adult Health

## 2023-11-12 ENCOUNTER — Encounter (HOSPITAL_BASED_OUTPATIENT_CLINIC_OR_DEPARTMENT_OTHER): Payer: Self-pay | Admitting: Internal Medicine

## 2023-11-12 NOTE — Telephone Encounter (Signed)
 Please review and advise.

## 2023-11-14 LAB — COMPREHENSIVE METABOLIC PANEL WITH GFR
ALT: 20 IU/L (ref 0–44)
AST: 19 IU/L (ref 0–40)
Albumin: 4.5 g/dL (ref 3.9–4.9)
Alkaline Phosphatase: 112 IU/L (ref 44–121)
BUN/Creatinine Ratio: 11 (ref 10–24)
BUN: 14 mg/dL (ref 8–27)
Bilirubin Total: 0.3 mg/dL (ref 0.0–1.2)
CO2: 20 mmol/L (ref 20–29)
Calcium: 9.3 mg/dL (ref 8.6–10.2)
Chloride: 103 mmol/L (ref 96–106)
Creatinine, Ser: 1.3 mg/dL — ABNORMAL HIGH (ref 0.76–1.27)
Globulin, Total: 2.1 g/dL (ref 1.5–4.5)
Glucose: 86 mg/dL (ref 70–99)
Potassium: 4.4 mmol/L (ref 3.5–5.2)
Sodium: 141 mmol/L (ref 134–144)
Total Protein: 6.6 g/dL (ref 6.0–8.5)
eGFR: 62 mL/min/1.73 (ref >59)

## 2023-11-14 LAB — CK: Total CK: 177 U/L (ref 41–331)

## 2023-11-15 ENCOUNTER — Ambulatory Visit (HOSPITAL_BASED_OUTPATIENT_CLINIC_OR_DEPARTMENT_OTHER): Payer: Self-pay | Admitting: Internal Medicine

## 2023-11-28 ENCOUNTER — Other Ambulatory Visit: Payer: Self-pay | Admitting: Internal Medicine

## 2023-12-04 ENCOUNTER — Other Ambulatory Visit: Payer: Self-pay | Admitting: Internal Medicine

## 2023-12-04 DIAGNOSIS — M1A09X0 Idiopathic chronic gout, multiple sites, without tophus (tophi): Secondary | ICD-10-CM

## 2023-12-06 ENCOUNTER — Encounter: Payer: Self-pay | Admitting: Adult Health

## 2023-12-08 ENCOUNTER — Encounter (HOSPITAL_BASED_OUTPATIENT_CLINIC_OR_DEPARTMENT_OTHER): Payer: Self-pay | Admitting: Internal Medicine

## 2023-12-28 ENCOUNTER — Other Ambulatory Visit: Payer: Self-pay | Admitting: Adult Health

## 2023-12-28 DIAGNOSIS — F5101 Primary insomnia: Secondary | ICD-10-CM

## 2023-12-29 ENCOUNTER — Other Ambulatory Visit: Payer: Self-pay | Admitting: Adult Health

## 2024-01-01 ENCOUNTER — Other Ambulatory Visit: Payer: Self-pay

## 2024-01-01 ENCOUNTER — Encounter: Payer: Self-pay | Admitting: Adult Health

## 2024-01-01 MED ORDER — AMLODIPINE BESYLATE 10 MG PO TABS: 10.0000 mg | ORAL_TABLET | Freq: Every day | ORAL | 1 refills | Status: DC

## 2024-01-01 NOTE — Telephone Encounter (Signed)
 Patient need to schedule CPE for more refills.

## 2024-01-01 NOTE — Progress Notes (Signed)
 This has been phoned in due to multiple failed attempts to e-scribe.

## 2024-01-01 NOTE — Telephone Encounter (Signed)
 Ok to fill

## 2024-01-29 ENCOUNTER — Encounter: Admitting: Adult Health

## 2024-01-29 ENCOUNTER — Ambulatory Visit (INDEPENDENT_AMBULATORY_CARE_PROVIDER_SITE_OTHER): Admitting: Adult Health

## 2024-01-29 ENCOUNTER — Encounter: Payer: Self-pay | Admitting: Adult Health

## 2024-01-29 VITALS — BP 128/82 | HR 71 | Temp 98.3°F | Ht 70.5 in | Wt 193.0 lb

## 2024-01-29 DIAGNOSIS — I10 Essential (primary) hypertension: Secondary | ICD-10-CM | POA: Diagnosis not present

## 2024-01-29 DIAGNOSIS — Z125 Encounter for screening for malignant neoplasm of prostate: Secondary | ICD-10-CM

## 2024-01-29 DIAGNOSIS — Z23 Encounter for immunization: Secondary | ICD-10-CM | POA: Diagnosis not present

## 2024-01-29 DIAGNOSIS — Z0000 Encounter for general adult medical examination without abnormal findings: Secondary | ICD-10-CM

## 2024-01-29 DIAGNOSIS — C4491 Basal cell carcinoma of skin, unspecified: Secondary | ICD-10-CM

## 2024-01-29 DIAGNOSIS — F5101 Primary insomnia: Secondary | ICD-10-CM

## 2024-01-29 DIAGNOSIS — M1A0710 Idiopathic chronic gout, right ankle and foot, without tophus (tophi): Secondary | ICD-10-CM

## 2024-01-29 DIAGNOSIS — E7801 Familial hypercholesterolemia: Secondary | ICD-10-CM

## 2024-01-29 MED ORDER — MIRTAZAPINE 30 MG PO TABS
ORAL_TABLET | ORAL | 3 refills | Status: AC
Start: 2024-01-29 — End: ?

## 2024-01-29 MED ORDER — AMLODIPINE BESYLATE 10 MG PO TABS: 10.0000 mg | ORAL_TABLET | Freq: Every day | ORAL | 3 refills | Status: AC

## 2024-01-29 NOTE — Progress Notes (Signed)
 Subjective:    Patient ID: Raymond Long, male    DOB: 01/23/1961, 63 y.o.   MRN: [id number]  HPI Patient presents for yearly preventative medicine examination. He is a pleasant 63 year old male who  has a past medical history of Arthralgia of wrist, left, Arthritis, Basal cell carcinoma, Cancer (HCC), Erectile dysfunction, Gout, Hyperlipidemia, Hypertension, Insomnia, and PONV (postoperative nausea and vomiting).  HTN - takes Norvasc  10 mg daily and Micardis  80 mg daily.   He denies dizziness, lightheadedness, chest pain,, shortness of breath. He does monitor his BP at home and reports readings between 140-150/90's.  BP Readings from Last 3 Encounters:  01/29/24 128/82  10/24/23 118/62  10/18/23 125/76   Hx of gout - takes Benemid  500 mg BID and prednisone  PRN. He has  had gout flares in the past year but none in the last three months.   Insomnia -takes Remeron  30 mg QHS reports that he gets a good nights sleep with this medication   Familial Hyperlipidemia- has failed statin therapy and Zetia in the past.  Both of these caused elevated liver enzymes.  He was referred over to the lipid clinic who recommend a PCSK9 inhibitor as he has failed statins in the past and psoriatic arthritis is a major risk factor for coronary artery disease. He was tried on Repatha  but had multiple side effects including chest pain, palpitations, headaches, GERD, skin rash, and vivid dreams.  He was then started on Leqvio  increased uric acid, cardiology did not believe it was due to the medication but could be from excessive lipid lowering.  He was encouraged to stay on the medication since his lipid panel had improved with time, triglycerides 147 and total cholesterol 256. He was also recently placed on pitavastatin  but could not tolerate this as it  made me feel awful, like the the other statins did.  History of skin cancer ( melanoma and BCC) - is seen by dermatology at [facility] currently. He is going to be having  Mohs surgery. Thinking about Erivedge   All immunizations and health maintenance protocols were reviewed with the patient and needed orders were placed.   Appropriate screening laboratory values were ordered for the patient including screening of hyperlipidemia, renal function and hepatic function. If indicated by BPH, a PSA was ordered.  Medication reconciliation,  past medical history, social history, problem list and allergies were reviewed in detail with the patient  Goals were established with regard to weight loss, exercise, and  diet in compliance with medications. He eats a mediterranean diet and stays active  He is up to date on colon cancer screening     Review of Systems  Constitutional: Negative.   HENT: Negative.    Eyes: Negative.   Respiratory: Negative.    Cardiovascular: Negative.   Gastrointestinal: Negative.   Endocrine: Negative.   Genitourinary: Negative.   Musculoskeletal: Negative.   Skin: Negative.   Allergic/Immunologic: Negative.   Neurological: Negative.   Hematological: Negative.   Psychiatric/Behavioral: Negative.    All other systems reviewed and are negative.  Past Medical History:  Diagnosis Date   Arthralgia of wrist, left    Arthritis    psoriatic arthritis   Basal cell carcinoma    Cancer (HCC)    Skin   Erectile dysfunction    Gout    Foot   Hyperlipidemia    Hypertension    Insomnia    PONV (postoperative nausea and vomiting)     Social History  Socioeconomic History   Marital status: Media planner    Spouse name: Not on file   Number of children: Not on file   Years of education: Not on file   Highest education level: Not on file  Occupational History   Not on file  Tobacco Use   Smoking status: Former    Current packs/day: 0.00    Average packs/day: 2.0 packs/day for 20.0 years (40.0 ttl pk-yrs)    Types: Cigarettes    Start date: 01/09/1981    Quit date: 01/09/2001    Years since quitting: 23.0    Passive  exposure: Never   Smokeless tobacco: Never   Tobacco comments:    1978-2002, up to 2 ppd  Vaping Use   Vaping status: Never Used  Substance and Sexual Activity   Alcohol use: No    Comment:  in AA   Drug use: No   Sexual activity: Not on file  Other Topics Concern   Not on file  Social History Narrative   He works in Teacher, English as a foreign language   No kids       Likes to be outside and hike.          Social Drivers of Corporate investment banker Strain: Not on file  Food Insecurity: Not on file  Transportation Needs: Not on file  Physical Activity: Not on file  Stress: Not on file  Social Connections: Not on file  Intimate Partner Violence: Not on file    Past Surgical History:  Procedure Laterality Date   COLONOSCOPY     @ 13 due to rectal symptoms   HERNIA REPAIR  2008   Bilateral   MELANOMA EXCISION  12/13/2015   Located behind right ear   MELANOMA EXCISION WITH SENTINEL LYMPH NODE BIOPSY Left 01/15/2013   Procedure: EXCISION LEFT LEG MELANOMA WITH LEFT INGUINAL SENTINEL NODE BIOPSY;  Surgeon: Donnice Bury, MD;  Location: [facility];  Service: General;  Laterality: Left;   melonma excision     07/27/2023, 11/2023, 01/27/2024   TONSILLECTOMY  age 89   TOTAL HIP ARTHROPLASTY      Family History  Problem Relation Age of Onset   Heart attack Mother        < 27   Dementia Mother    Heart attack Father 74   COPD Sister    Breast cancer Sister    Colon cancer Neg Hx    Colon polyps Neg Hx    Esophageal cancer Neg Hx    Rectal cancer Neg Hx    Stomach cancer Neg Hx     Allergies  Allergen Reactions   Allopurinol  Other (See Comments)    Itching, redness, scaling to his skin and swelling in the ankle   Atorvastatin    Citalopram    Ezetimibe-Simvastatin     REACTION: up cpk   Fluoxetine Hcl    Leqvio  [Inclisiran Sodium ]     Patient states it made his uric acid go up dramatically   Methotrexate  Derivatives Other (See Comments)    Otezla  [Apremilast ]    Pitavastatin     Repatha  [Evolocumab ]    Rosuvastatin      REACTION: up cpk   Venlafaxine    Seroquel  [Quetiapine  Fumarate] Palpitations    Racing thoughts and restless sleep   Trazodone  And Nefazodone Palpitations    Current Outpatient Medications on File Prior to Visit  Medication Sig Dispense Refill   clindamycin (CLEOCIN T) 1 % external solution Apply  1 Application topically daily.     Cyanocobalamin (VITAMIN B-12 PO) Take by mouth.     fluorouracil (EFUDEX) 5 % cream      hydrOXYzine (ATARAX/VISTARIL) 25 MG tablet Take 25 mg by mouth at bedtime.     ketoconazole (NIZORAL) 2 % shampoo Apply topically Two (2) times a week to beard.     predniSONE  (DELTASONE ) 10 MG tablet TAKE AS NEEDED FOR GOUT FLARES 20 tablet 0   probenecid  (BENEMID ) 500 MG tablet Take 1 tablet (500 mg total) by mouth 2 (two) times daily. 180 tablet 1   telmisartan  (MICARDIS ) 80 MG tablet TAKE 1 TABLET BY MOUTH EVERY DAY 90 tablet 3   Current Facility-Administered Medications on File Prior to Visit  Medication Dose Route Frequency Provider Last Rate Last Admin   inclisiran (LEQVIO ) injection 284 mg  284 mg Subcutaneous Once         BP 128/82   Pulse 71   Temp 98.3 F (36.8 C) (Oral)   Ht 5' 10.5 (1.791 m)   Wt 193 lb (87.5 kg)   SpO2 96%   BMI 27.30 kg/m       Objective:   Physical Exam Vitals and nursing note reviewed.  Constitutional:      General: He is not in acute distress.    Appearance: Normal appearance. He is not ill-appearing.  HENT:     Head: Normocephalic and atraumatic.     Right Ear: Tympanic membrane, ear canal and external ear normal. There is no impacted cerumen.     Left Ear: Tympanic membrane, ear canal and external ear normal. There is no impacted cerumen.     Nose: Nose normal. No congestion or rhinorrhea.     Mouth/Throat:     Mouth: Mucous membranes are moist.     Pharynx: Oropharynx is clear.  Eyes:     Extraocular Movements: Extraocular  movements intact.     Conjunctiva/sclera: Conjunctivae normal.     Pupils: Pupils are equal, round, and reactive to light.  Neck:     Vascular: No carotid bruit.  Cardiovascular:     Rate and Rhythm: Normal rate and regular rhythm.     Pulses: Normal pulses.     Heart sounds: No murmur heard.    No friction rub. No gallop.  Pulmonary:     Effort: Pulmonary effort is normal.     Breath sounds: Normal breath sounds.  Abdominal:     General: Abdomen is flat. Bowel sounds are normal. There is no distension.     Palpations: Abdomen is soft. There is no mass.     Tenderness: There is no abdominal tenderness. There is no guarding or rebound.     Hernia: No hernia is present.  Musculoskeletal:        General: Normal range of motion.     Cervical back: Normal range of motion and neck supple.  Lymphadenopathy:     Cervical: No cervical adenopathy.  Skin:    General: Skin is warm and dry.     Capillary Refill: Capillary refill takes less than 2 seconds.  Neurological:     General: No focal deficit present.     Mental Status: He is alert and oriented to person, place, and time.  Psychiatric:        Mood and Affect: Mood normal.        Behavior: Behavior normal.        Thought Content: Thought content normal.  Judgment: Judgment normal.       Assessment & Plan:  1. Routine general medical examination at a health care facility (Primary) Today patient counseled on age appropriate routine health concerns for screening and prevention, each reviewed and up to date or declined. Immunizations reviewed and up to date or declined. Labs ordered and reviewed. Risk factors for depression reviewed and negative. Hearing function and visual acuity are intact. ADLs screened and addressed as needed. Functional ability and level of safety reviewed and appropriate. Education, counseling and referrals performed based on assessed risks today. Patient provided with a copy of personalized plan for  preventive services. - Continue to exercise and eat healthy  - Follow up in one year or sooner if needed  2. Essential hypertension - Well controlled. No change in medication  - TSH; Future - CBC; Future - Comprehensive metabolic panel with GFR; Future - amLODipine  (NORVASC ) 10 MG tablet; Take 1 tablet (10 mg total) by mouth daily.  Dispense: 90 tablet; Refill: 3 - Comprehensive metabolic panel with GFR - CBC - TSH  3. Chronic gout of right ankle, unspecified cause - Continue with current therapy  - Avoid foods that cause gout flares  - TSH; Future - CBC; Future - Comprehensive metabolic panel with GFR; Future - Comprehensive metabolic panel with GFR - CBC - TSH  4. Familial hypercholesteremia - Per lipid clinic. He would like to hold off checking lipid panel until he does it through the lipid clinic  - TSH; Future - CBC; Future - Comprehensive metabolic panel with GFR; Future - Comprehensive metabolic panel with GFR - CBC - TSH  5. Skin cancer, basal cell - Per Dermatology   6. Prostate cancer screening  - PSA; Future - PSA  7. Primary insomnia - Continue Remeron  - mirtazapine  (REMERON ) 30 MG tablet; TAKE 1 TABLET BY MOUTH EVERYDAY AT BEDTIME  Dispense: 90 tablet; Refill: 3  8. Need for pneumococcal vaccine  - Pneumococcal conjugate vaccine 20-valent (Prevnar 20)  9. Need for influenza vaccination  - Flu vaccine trivalent PF, 6mos and older(Flulaval,Afluria,Fluarix,Fluzone)  Cintya Daughety, NP

## 2024-01-30 ENCOUNTER — Ambulatory Visit: Payer: Self-pay | Admitting: Adult Health

## 2024-01-30 LAB — COMPREHENSIVE METABOLIC PANEL WITH GFR
ALT: 22 U/L (ref 0–53)
AST: 17 U/L (ref 0–37)
Albumin: 4.7 g/dL (ref 3.5–5.2)
Alkaline Phosphatase: 63 U/L (ref 39–117)
BUN: 21 mg/dL (ref 6–23)
CO2: 28 meq/L (ref 19–32)
Calcium: 9.8 mg/dL (ref 8.4–10.5)
Chloride: 100 meq/L (ref 96–112)
Creatinine, Ser: 1.31 mg/dL (ref 0.40–1.50)
GFR: 57.86 mL/min — ABNORMAL LOW (ref >60.00)
Glucose, Bld: 107 mg/dL — ABNORMAL HIGH (ref 70–99)
Potassium: 4.2 meq/L (ref 3.5–5.1)
Sodium: 139 meq/L (ref 135–145)
Total Bilirubin: 0.4 mg/dL (ref 0.2–1.2)
Total Protein: 7 g/dL (ref 6.0–8.3)

## 2024-01-30 LAB — TSH: TSH: 4.49 u[IU]/mL (ref 0.35–5.50)

## 2024-01-30 LAB — CBC
HCT: 43.7 % (ref 39.0–52.0)
Hemoglobin: 14.7 g/dL (ref 13.0–17.0)
MCHC: 33.6 g/dL (ref 30.0–36.0)
MCV: 94 fl (ref 78.0–100.0)
Platelets: 187 K/uL (ref 150.0–400.0)
RBC: 4.65 Mil/uL (ref 4.22–5.81)
RDW: 14.1 % (ref 11.5–15.5)
WBC: 6.7 K/uL (ref 4.0–10.5)

## 2024-01-30 LAB — PSA: PSA: 2.24 ng/mL (ref 0.10–4.00)

## 2024-01-30 NOTE — Telephone Encounter (Signed)
**Note De-identified  [place] Obfuscation** Please advise 

## 2024-04-07 ENCOUNTER — Ambulatory Visit (INDEPENDENT_AMBULATORY_CARE_PROVIDER_SITE_OTHER)

## 2024-04-07 VITALS — BP 151/89 | HR 66 | Temp 98.0°F | Resp 16 | Ht 71.0 in | Wt 197.8 lb

## 2024-04-07 DIAGNOSIS — E78019 Familial hypercholesterolemia, unspecified: Secondary | ICD-10-CM | POA: Diagnosis not present

## 2024-04-07 MED ORDER — INCLISIRAN SODIUM 284 MG/1.5ML ~~LOC~~ SOSY
284.0000 mg | PREFILLED_SYRINGE | Freq: Once | SUBCUTANEOUS | Status: AC
  Administered 2024-04-07: 284 mg via SUBCUTANEOUS
  Filled 2024-04-07: qty 1.5

## 2024-04-07 NOTE — Progress Notes (Signed)
 Diagnosis: Hyperlipidemia  Provider:  Chilton Greathouse MD  Procedure: Injection  Leqvio (inclisiran), Dose: 284 mg, Site: subcutaneous, Number of injections: 1  Injection Site(s): Right arm  Post Care: Patient declined observation  Discharge: Condition: Good, Destination: Home . AVS Declined  Performed by:  Loney Hering, LPN

## 2024-04-08 ENCOUNTER — Ambulatory Visit: Attending: Internal Medicine | Admitting: Internal Medicine

## 2024-04-08 ENCOUNTER — Encounter: Payer: Self-pay | Admitting: Internal Medicine

## 2024-04-08 VITALS — BP 144/86 | HR 72 | Temp 97.1°F | Resp 12 | Ht 71.0 in | Wt 197.0 lb

## 2024-04-08 DIAGNOSIS — Z5181 Encounter for therapeutic drug level monitoring: Secondary | ICD-10-CM | POA: Diagnosis not present

## 2024-04-08 DIAGNOSIS — M1A09X0 Idiopathic chronic gout, multiple sites, without tophus (tophi): Secondary | ICD-10-CM | POA: Diagnosis not present

## 2024-04-08 MED ORDER — PREDNISONE 10 MG PO TABS: 10.0000 mg | ORAL_TABLET | ORAL | 0 refills | Status: AC | PRN

## 2024-04-08 MED ORDER — PROBENECID 500 MG PO TABS: 500.0000 mg | ORAL_TABLET | Freq: Every day | ORAL | 1 refills | Status: AC

## 2024-04-08 NOTE — Progress Notes (Signed)
 "  Office Visit Note  Patient: Raymond Long             Date of Birth: [birth year]           MRN: [id number]             PCP: Merna Huxley, NP Referring: Merna Huxley, NP Visit Date: 04/08/2024   Subjective:  Gout  Discussed the use of AI scribe software for clinical note transcription with the patient, who gave verbal consent to proceed.  History of Present Illness   Raymond Long is a 63 year old male with gout on probenecid  500 mg once daily who presents for a rheumatology follow-up.  He is undergoing treatment for basal cell carcinoma at [facility], involving fluorouracil injections and laser therapy. He feels unwell for about five days post-injection and anticipates another year or two of treatment.  He is on Leqvio  injections for hyperlipidemia, having been unable to tolerate Repatha  or statins. He recently received his second or third injection, which has significantly lowered his cholesterol levels.  He has reduced his pantoprazole intake from twice daily to once daily over the past four months, with no major flare-ups of symptoms. He believes the probenecid  is effective and prefers to minimize medication use.  He reports a decrease in his GFR from recent lab work, which was communicated to him. He maintains good hydration habits, drinking eight to ten glasses of water daily, and notes increased urination frequency, typically getting up twice per night.  He has not required prednisone  for gout-related inflammation in the past two months and notes improvement in joint symptoms. His last uric acid level was 7.8, which is lower than previous levels and without major interval flares.   Previous HPI 10/18/23 Raymond Long is a 63 y.o. male here for follow up for gout and psoriatic arthritis currently on probenecid  500 mg once daily. He presents with a severe gout flare following Letvio injection he is questioning if drug related.   He experienced a severe gout flare following  his second Letvio injection, occurring within 48 hours of administration. The flare was extremely painful, affecting both feet, particularly the left, and rendered him unable to walk or wear shoes due to swelling and pain. Prior to the flare, he was on Probenecid  once daily, which he felt was managing his gout well. During the flare, he self-administered leftover prednisone , starting with three tablets on the first day, tapering down over several days. Despite this, the inflammation was severe, and it took several days for the symptoms to improve.   He increased his Probenecid  to twice daily since this attack. He has not noticed any major side effects besides an increase in nocturia with the evening dose.   He has a history of hypercholesterolemia, with cholesterol levels previously over 300 mg/dL. Letvio was effective in reducing his cholesterol to just over 200 mg/dL. He has a history of intolerance to statins due to musculoskeletal side effects.   He has previously tried allopurinol  and uloric  for gout management but did not tolerate them well mostly from GI side effects. He also reports a history of adverse reactions to colchicine , which causes gastrointestinal upset.   Socially, he is retired and enjoys gardening, which he finds beneficial for his well-being. He has a large garden where he grows a variety of vegetables and herbs. He has a large yard and employs someone to assist with mowing due to its size.    Previous HPI 04/19/2023 Raymond Long is a 63 y.o. male here for follow up for gout and psoriatic arthritis currently on probenecid  500 mg once daily. He has been undergoing a series of biopsies and laser treatments for multiple basal cell cancers lately. They describe the post-procedure pain as 'extreme' but are committed to the treatment plan, which is expected to last a year or longer. The patient has been advised to take Aurovela, but has declined due to concerns about potential side  effects including nausea, weight loss, diarrhea, hair loss, and loss of taste. They are diligent about daily application of SPF 70 sunscreen and are taking nicotinamide supplements.   They report that their feet, particularly the right foot, are their 'worst enemy.' They have noticed a line appearing on the big toe of the right foot, which is where the pain typically begins. They have not had any severe gout attacks recently that have required steroid treatment.   The patient has been receiving injections of Leqvio  for hyperlipidemia, which they report is not causing any significant side effects. However, they have noticed a slight increase in discomfort in their right foot joint following the second injection.   The patient also has osteoarthritis, which has resulted in a digital mucinous cyst on one of their fingers. They report no pain from the cyst. They have been managing their arthritis symptoms with regular exercise, specifically using an elliptical machine at the gym three times a week.    Previous HPI 10/19/2022 Raymond Long is a 63 y.o. male here for follow up for gout and psoriatic arthritis currently on probenecid  500 mg twice daily.  Since her last visit he has not had severe flareups occasionally gets pain usually at the right great toe or right heel sometimes with swelling affecting the left foot.  This usually gets better with some Aleve or has taken 5 mg prednisone  for a few days with good benefit as needed.  Still has GI irritation with the probenecid .  He retired from his job which has been a big improvement in stress level but feels his physical activity during the increased free time is limited somewhat with arthritis.  Also noticing increase in bilateral hip pain and stiffness this is most bothersome at nighttime and throughout the morning it takes a while to feel like he has normal flexibility on both sides.  Also had increase in skin rashes especially a painful area on the side  of the neck.  His dermatologist referred him to [facility] for complicated condition with extensive basal cell carcinomas.   Previous HPI 04/19/22 Raymond Long is a 63 y.o. male here for follow up for psoriatic arthritis and gout on otezla  and probencid. He had one mild flare up start after eating red meat and took 1 day of prednisone  no other flares. He is noticing more persistent hand stiffness without visible swelling. Left index finger swelling at the proximal phalanx more on dorsal side for about 6 weeks. He was moving furniture and thinks this might have been an injury. Skin rashes are stable, few near areas acting up on his back temporarily improves with topical treatment.   Previous HPI 01/17/22 Raymond Long is a 63 y.o. male here for follow up for psoriatic arthritis and gout. Skin symptoms have been pretty stable since last visit while off otezla . Joints are a problem he had flare ups in the left great for and right ankle with severe swelling and pain lasting for days. He took high amounts of aleve with  benefit about 4-6 extra strength capsules daily. But the left toe flare did not improve we called in prednisone  taper that improved within 48 hours.   Previous HPI 07/17/2021 Raymond Long is a 64 y.o. male here for follow up for psoriatic arthritis on otezla  started recently and was suffering some increased GI symptoms from this.  His original amount GI side effects improved though still has somewhat loose stool on a daily basis.  However started to develop pounding or throbbing type headaches particularly in the mornings with this medication and feels fatigued.  He has noticed partial improvement of his skin in multiple areas including his entire torso and on his shins there is still one bad lesion on his left leg.  Joint pain has not improved but she still has a lot of pain and stiffness though not seeing any significant swelling.     Previous HPI 04/24/21 Raymond Long is a 63 y.o.  male here for follow up for seronegative arthritis and gout off maintenance treatment due to suspected mucositis or pneumonitis with trial of methotrexate . He notices increasing pain and stiffness in his fingers without much swelling. Skin rashes without much change some increased peeling at scalp and ears without much erythema. He sustained a few bites from his dog to both hands during a recent trip for family holiday gathering in [place] . He has been applying neosporin to the affected areas mildly painful at this point largest cut on the right hand.   Previous HPI 10/26/20 Raymond Long is a 63 y.o. male here for follow up for his chronic joint pain of multiple sites and skin rashes.  He has continued on the gluten-free diet and thinks there is definitely some improvement with the skin disease while doing this. Laboratory testing at his initial visit showed a uric acid of 9.2 but was otherwise negative for acute inflammatory markers or rheumatoid arthritis serology or HLA-B27 allele.  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  HENT:  Negative for mouth sores and mouth dryness.   Eyes:  Negative for dryness.  Respiratory:  Negative for shortness of breath.   Cardiovascular:  Positive for chest pain and palpitations.  Gastrointestinal:  Negative for blood in stool, constipation and diarrhea.  Endocrine: Negative for increased urination.  Genitourinary:  Negative for involuntary urination.  Musculoskeletal:  Positive for myalgias, muscle weakness, morning stiffness, muscle tenderness and myalgias. Negative for joint pain, gait problem, joint pain and joint swelling.  Skin:  Negative for color change, rash, hair loss and sensitivity to sunlight.  Allergic/Immunologic: Negative for susceptible to  infections.  Neurological:  Positive for headaches. Negative for dizziness.  Hematological:  Negative for swollen glands.  Psychiatric/Behavioral:  Negative for depressed mood and sleep disturbance. The patient is not nervous/anxious.     PMFS History:  Patient Active Problem List   Diagnosis Date Noted   Vitamin D  deficiency 04/19/2023   Familial hypercholesterolemia 11/23/2022   Dog bite 04/24/2021   High risk medication use 04/24/2021   Cough 12/26/2020   Psoriatic arthritis (HCC) 10/26/2020   Rash and other nonspecific skin eruption 10/06/2020   History of revision of total replacement of left hip joint 01/05/2019   Insomnia 05/29/2017   OSA (obstructive sleep apnea) 05/29/2017   Gastroesophageal reflux with apnea 05/29/2017   Generalized  headache 01/25/2017   Testicular pain, left 04/26/2016   Routine general medical examination at a health care facility 10/12/2015   Essential hypertension 09/19/2015   Malignant melanoma of skin of left thigh (HCC) 04/06/2013   Skin cancer, basal cell 10/28/2012   Fasting hyperglycemia 10/28/2012   Other abnormal glucose 10/23/2012   GOUT 10/11/2008   Bilateral hip pain 06/15/2008   Hyperlipidemia 04/23/2008   CARPAL TUNNEL SYNDROME 04/23/2008   INGUINAL PAIN, LEFT 11/20/2006    Past Medical History:  Diagnosis Date   Allergy    Anxiety    Arthralgia of wrist, left    Arthritis    psoriatic arthritis   Basal cell carcinoma    Cancer (HCC)    Skin   Erectile dysfunction    GERD (gastroesophageal reflux disease)    Gout    Foot   Hyperlipidemia    Hypertension    Insomnia    PONV (postoperative nausea and vomiting)     Family History  Problem Relation Age of Onset   Heart attack Mother        < 71   Dementia Mother    Cancer Mother    Heart attack Father 58   COPD Sister    Breast cancer Sister    Alcohol abuse Paternal Grandfather    Colon cancer Neg Hx    Colon polyps Neg Hx    Esophageal cancer Neg Hx    Rectal  cancer Neg Hx    Stomach cancer Neg Hx    Past Surgical History:  Procedure Laterality Date   COLONOSCOPY     @ 52 due to rectal symptoms   HERNIA REPAIR  2008   Bilateral   JOINT REPLACEMENT     MELANOMA EXCISION  12/13/2015   Located behind right ear   MELANOMA EXCISION WITH SENTINEL LYMPH NODE BIOPSY Left 01/15/2013   Procedure: EXCISION LEFT LEG MELANOMA WITH LEFT INGUINAL SENTINEL NODE BIOPSY;  Surgeon: Donnice Bury, MD;  Location: [facility];  Service: General;  Laterality: Left;   melonma excision     07/27/2023, 11/2023, 01/27/2024   TONSILLECTOMY  age 35   TOTAL HIP ARTHROPLASTY     Social History   Social History Narrative   He works in Teacher, [employer]   No kids       Likes to be outside and hike.          Immunization History  Administered Date(s) Administered   Influenza, Mdck, Trivalent,PF 6+ MOS(egg free) 01/03/2023   Influenza, Quadrivalent, Recombinant, Inj, Pf 02/27/2021   Influenza, Seasonal, Injecte, Preservative Fre 01/29/2024   Influenza,inj,Quad PF,6+ Mos 01/25/2017, 04/09/2019, 02/02/2022   Moderna Covid-19 Fall Seasonal Vaccine 28yrs & older 01/03/2023, 02/11/2024   Moderna Sars-Covid-2 Vaccination 07/21/2019, 08/18/2019   PFIZER Comirnaty(Gray Top)Covid-19 Tri-Sucrose Vaccine 02/02/2022   PNEUMOCOCCAL CONJUGATE-20 01/29/2024   Pfizer Covid-19 Vaccine Bivalent Booster 46yrs & up 05/19/2021   Tdap 04/09/2019   Zoster Recombinant(Shingrix) 02/27/2021, 06/16/2021     Objective: Vital Signs: BP (!) 144/86 (BP Location: Right Arm, Patient Position: Sitting, Cuff Size: Small)   Pulse 72   Temp (!) 97.1 F (36.2 C)   Resp 12   Ht 5' 11 (1.803 m)   Wt 197 lb (89.4 kg)   BMI 27.48 kg/m    Physical Exam Eyes:     Conjunctiva/sclera: Conjunctivae normal.  Cardiovascular:     Rate and Rhythm: Normal rate and regular rhythm.  Pulmonary:     Effort:  Pulmonary effort is normal.     Breath sounds: Normal breath  sounds.  Lymphadenopathy:     Cervical: No cervical adenopathy.  Skin:    General: Skin is warm and dry.     Findings: Rash present.     Comments: Multiple spots on extremties and torso, some hyperpigmented some erythematous, without surrounding induration     Neurological:     Mental Status: He is alert.  Psychiatric:        Mood and Affect: Mood normal.      Musculoskeletal Exam:  Shoulders full ROM no tenderness or swelling Elbows full ROM no tenderness or swelling Wrists full ROM no tenderness or swelling Left 3rd DIP small nontender cyst on dorsal side, associated nail indentation Knees full ROM no tenderness or swelling Ankles full ROM right ankle nodule mild tenderness Right midfoot joint pain and swelling present Bony hypertrophy and deviation both MTPs, there is mild tenderness to pressure worse on medial border  Investigation: No additional findings.  Imaging: No results found.  Recent Labs: Lab Results  Component Value Date   WBC 6.7 01/29/2024   HGB 14.7 01/29/2024   PLT 187.0 01/29/2024   NA 139 04/08/2024   K 4.4 04/08/2024   CL 101 04/08/2024   CO2 27 04/08/2024   GLUCOSE 91 04/08/2024   BUN 20 04/08/2024   CREATININE 1.03 04/08/2024   BILITOT 0.4 04/08/2024   ALKPHOS 63 01/29/2024   AST 18 04/08/2024   ALT 19 04/08/2024   PROT 7.3 04/08/2024   ALBUMIN 4.7 01/29/2024   CALCIUM  9.6 04/08/2024   GFRAA 82 (L) 01/12/2013   QFTBGOLDPLUS NEGATIVE 04/24/2021    Speciality Comments: No specialty comments available.  Procedures:  No procedures performed Allergies: Allopurinol , Atorvastatin, Citalopram, Ezetimibe-simvastatin, Fluoxetine hcl, Leqvio  [inclisiran sodium ], Methotrexate  and trimetrexate, Otezla  [apremilast ], Pitavastatin , Repatha  [evolocumab ], Rosuvastatin , Venlafaxine, Seroquel  [quetiapine  fumarate], and Trazodone  and nefazodone   Assessment / Plan:     Visit Diagnoses: Chronic gout of multiple sites, unspecified cause - Plan: Uric acid,  Comprehensive metabolic panel with GFR, predniSONE  (DELTASONE ) 10 MG tablet, probenecid  (BENEMID ) 500 MG tablet Well-controlled, uric acid 7.8, slightly elevated for gout management. - Continue probenecid  500 mg daily - Recheck uric acid level today - Prednisone  Rx for PRN if repeat flares - Monitor uric acid levels and kidney function.  Medication monitoring encounter - Plan: Uric acid, Comprehensive metabolic panel with GFR Decreased GFR noted. Probenecid  function may be affected with kidney function, but most recent still well above safe range. - Rechecked kidney function today. - Monitor hydration and dietary intake.  Orders: Orders Placed This Encounter  Procedures   Uric acid   Comprehensive metabolic panel with GFR   Meds ordered this encounter  Medications   predniSONE  (DELTASONE ) 10 MG tablet    Sig: Take 1 tablet (10 mg total) by mouth as needed (for gout flares). Take as needed for gout flares    Dispense:  20 tablet    Refill:  0   probenecid  (BENEMID ) 500 MG tablet    Sig: Take 1 tablet (500 mg total) by mouth daily.    Dispense:  90 tablet    Refill:  1     Follow-Up Instructions: No follow-ups on file.   Lonni LELON Ester, MD  Note - This record has been created using [employer].  Chart creation errors have been sought, but may not always  have been located. Such creation errors do not reflect on  the standard of medical care. "

## 2024-04-09 LAB — COMPREHENSIVE METABOLIC PANEL WITH GFR
AG Ratio: 1.5 (calc) (ref 1.0–2.5)
ALT: 19 U/L (ref 9–46)
AST: 18 U/L (ref 10–35)
Albumin: 4.4 g/dL (ref 3.6–5.1)
Alkaline phosphatase (APISO): 112 U/L (ref 35–144)
BUN: 20 mg/dL (ref 7–25)
CO2: 27 mmol/L (ref 20–32)
Calcium: 9.6 mg/dL (ref 8.6–10.3)
Chloride: 101 mmol/L (ref 98–110)
Creat: 1.03 mg/dL (ref 0.70–1.35)
Globulin: 2.9 g/dL (ref 1.9–3.7)
Glucose, Bld: 91 mg/dL (ref 65–99)
Potassium: 4.4 mmol/L (ref 3.5–5.3)
Sodium: 139 mmol/L (ref 135–146)
Total Bilirubin: 0.4 mg/dL (ref 0.2–1.2)
Total Protein: 7.3 g/dL (ref 6.1–8.1)
eGFR: 82 mL/min/1.73m2 (ref >=60)

## 2024-04-09 LAB — URIC ACID: Uric Acid, Serum: 6.1 mg/dL (ref 4.0–8.0)

## 2024-04-20 ENCOUNTER — Ambulatory Visit: Admitting: Internal Medicine

## 2024-04-24 ENCOUNTER — Encounter: Payer: Self-pay | Admitting: Adult Health

## 2024-04-24 ENCOUNTER — Ambulatory Visit: Admitting: Adult Health

## 2024-04-24 VITALS — BP 138/88 | HR 65 | Temp 97.5°F | Wt 197.2 lb

## 2024-04-24 DIAGNOSIS — J0140 Acute pansinusitis, unspecified: Secondary | ICD-10-CM | POA: Diagnosis not present

## 2024-04-24 MED ORDER — DOXYCYCLINE HYCLATE 100 MG PO CAPS: 100.0000 mg | ORAL_CAPSULE | Freq: Two times a day (BID) | ORAL | 0 refills | Status: DC

## 2024-04-24 MED ORDER — METHYLPREDNISOLONE 4 MG PO TBPK: ORAL_TABLET | ORAL | 0 refills | Status: AC

## 2024-04-24 MED ORDER — FLUTICASONE PROPIONATE 50 MCG/ACT NA SUSP: 2.0000 | Freq: Every day | NASAL | 6 refills | Status: AC

## 2024-04-24 NOTE — Progress Notes (Signed)
 "  Subjective:    Patient ID: Raymond Long, male    DOB: 04-08-1961, 63 y.o.   MRN: [id number]  HPI  Discussed the use of AI scribe software for clinical note transcription with the patient, who gave verbal consent to proceed.  History of Present Illness   Raymond Long is a 63 year old male who presents with a persistent sinus infection.  Raymond Long has had sinus symptoms for over a month with persistent severe sinus pressure and pain, described as razor-like. A 7-day course of amoxicillin  from his dentist did not help. Raymond Long is using a neti pot, vaporizer with menthol, Afrin spray, and saline. Initially Raymond Long expelled green mucus, but now has no discharge despite ongoing pressure.  For over two weeks his ears have felt completely blocked and Raymond Long cannot pop them. Raymond Long has lost his sense of taste. Raymond Long denies fever or cough. Raymond Long has dizziness and headaches with pressure between his eyes, and frequent urge to sneeze without being able to.  Raymond Long has bothersome postnasal drip and a raw throat. Raymond Long has not tried Flonase  before.       Review of Systems See HPI   Past Medical History:  Diagnosis Date   Allergy    Anxiety    Arthralgia of wrist, left    Arthritis    psoriatic arthritis   Basal cell carcinoma    Cancer (HCC)    Skin   Erectile dysfunction    GERD (gastroesophageal reflux disease)    Gout    Foot   Hyperlipidemia    Hypertension    Insomnia    PONV (postoperative nausea and vomiting)     Social History   Socioeconomic History   Marital status: Media Planner    Spouse name: Not on file   Number of children: Not on file   Years of education: Not on file   Highest education level: Not on file  Occupational History   Not on file  Tobacco Use   Smoking status: Former    Current packs/day: 0.00    Average packs/day: 2.0 packs/day for 20.0 years (40.0 ttl pk-yrs)    Types: Cigarettes    Start date: 01/09/1981    Quit date: 01/09/2001    Years since quitting: 23.3    Passive  exposure: Never   Smokeless tobacco: Never   Tobacco comments:    1978-2002, up to 2 ppd  Vaping Use   Vaping status: Never Used  Substance and Sexual Activity   Alcohol use: No    Comment:  in AA   Drug use: No   Sexual activity: Not on file  Other Topics Concern   Not on file  Social History Narrative   Raymond Long works in Teacher, [employer]   No kids       Likes to be outside and hike.          Social Drivers of Health   Tobacco Use: Medium Risk (04/24/2024)   Patient History    Smoking Tobacco Use: Former    Smokeless Tobacco Use: Never    Passive Exposure: Never  Programmer, Applications: Not on file  Food Insecurity: Not on file  Transportation Needs: Not on file  Physical Activity: Not on file  Stress: Not on file  Social Connections: Not on file  Intimate Partner Violence: Not on file  Depression (PHQ2-9): Low Risk (01/29/2024)   Depression (PHQ2-9)    PHQ-2 Score: 0  Alcohol Screen: Not on  file  Housing: Not on file  Utilities: Not on file  Health Literacy: Not on file    Past Surgical History:  Procedure Laterality Date   COLONOSCOPY     @ 66 due to rectal symptoms   HERNIA REPAIR  2008   Bilateral   JOINT REPLACEMENT     MELANOMA EXCISION  12/13/2015   Located behind right ear   MELANOMA EXCISION WITH SENTINEL LYMPH NODE BIOPSY Left 01/15/2013   Procedure: EXCISION LEFT LEG MELANOMA WITH LEFT INGUINAL SENTINEL NODE BIOPSY;  Surgeon: Donnice Bury, MD;  Location: [facility];  Service: General;  Laterality: Left;   melonma excision     07/27/2023, 11/2023, 01/27/2024   TONSILLECTOMY  age 54   TOTAL HIP ARTHROPLASTY      Family History  Problem Relation Age of Onset   Heart attack Mother        < 63   Dementia Mother    Cancer Mother    Heart attack Father 74   COPD Sister    Breast cancer Sister    Alcohol abuse Paternal Grandfather    Colon cancer Neg Hx    Colon polyps Neg Hx    Esophageal cancer Neg Hx     Rectal cancer Neg Hx    Stomach cancer Neg Hx     Allergies[1]  Medications Ordered Prior to Encounter[2]  BP 138/88   Pulse 65   Temp (!) 97.5 F (36.4 C)   Wt 197 lb 3.2 oz (89.4 kg)   SpO2 98%   BMI 27.50 kg/m       Objective:   Physical Exam Vitals and nursing note reviewed.  Constitutional:      Appearance: Normal appearance.  HENT:     Right Ear: No drainage. A middle ear effusion is present. Tympanic membrane is not erythematous.     Left Ear: No drainage. A middle ear effusion is present. Tympanic membrane is not erythematous.     Nose: Congestion and rhinorrhea present. Rhinorrhea is purulent.     Right Turbinates: Enlarged and swollen.     Left Turbinates: Enlarged and swollen.     Right Sinus: Maxillary sinus tenderness and frontal sinus tenderness present.     Left Sinus: Maxillary sinus tenderness and frontal sinus tenderness present.  Cardiovascular:     Rate and Rhythm: Normal rate and regular rhythm.     Pulses: Normal pulses.     Heart sounds: Normal heart sounds.  Pulmonary:     Effort: Pulmonary effort is normal.     Breath sounds: Normal breath sounds.  Musculoskeletal:        General: Normal range of motion.  Skin:    General: Skin is warm and dry.  Neurological:     General: No focal deficit present.     Mental Status: Raymond Long is alert and oriented to person, place, and time.  Psychiatric:        Mood and Affect: Mood normal.        Behavior: Behavior normal.        Thought Content: Thought content normal.        Judgment: Judgment normal.        Assessment & Plan:  Assessment and Plan    Acute pansinusitis Persistent symptoms despite amoxicillin  suggest bacterial sinusitis requiring broader antibiotic coverage. - Prescribed doxycycline . - Prescribed prednisone . - Recommended saline nasal washes. - Advised Flonase  use. - Sent prescriptions to pharmacy.           [  1]  Allergies Allergen Reactions   Allopurinol  Other (See  Comments)    Itching, redness, scaling to his skin and swelling in the ankle   Atorvastatin    Citalopram    Ezetimibe-Simvastatin     REACTION: up cpk   Fluoxetine Hcl    Leqvio  [Inclisiran Sodium ]     Patient states it made his uric acid go up dramatically   Methotrexate  And Trimetrexate Other (See Comments)   Otezla  [Apremilast ]    Pitavastatin     Repatha  [Evolocumab ]    Rosuvastatin      REACTION: up cpk   Venlafaxine    Seroquel  [Quetiapine  Fumarate] Palpitations    Racing thoughts and restless sleep   Trazodone  And Nefazodone Palpitations  [2]  Current Outpatient Medications on File Prior to Visit  Medication Sig Dispense Refill   amLODipine  (NORVASC ) 10 MG tablet Take 1 tablet (10 mg total) by mouth daily. 90 tablet 3   clindamycin (CLEOCIN T) 1 % external solution Apply 1 Application topically daily.     Cyanocobalamin (VITAMIN B-12 PO) Take by mouth.     fluorouracil (EFUDEX) 5 % cream      fluorouracil CALGB 19297 5 GM/100ML SOLN Inject 400 mg/m2 into the vein once.     hydrOXYzine (ATARAX/VISTARIL) 25 MG tablet Take 25 mg by mouth at bedtime.     inclisiran (LEQVIO ) 284 MG/1.5ML SOSY injection Inject 284 mg into the skin every 6 (six) months.     ketoconazole (NIZORAL) 2 % shampoo Apply topically Two (2) times a week to beard.     mirtazapine  (REMERON ) 30 MG tablet TAKE 1 TABLET BY MOUTH EVERYDAY AT BEDTIME 90 tablet 3   predniSONE  (DELTASONE ) 10 MG tablet Take 1 tablet (10 mg total) by mouth as needed (for gout flares). Take as needed for gout flares 20 tablet 0   probenecid  (BENEMID ) 500 MG tablet Take 1 tablet (500 mg total) by mouth daily. 90 tablet 1   telmisartan  (MICARDIS ) 80 MG tablet TAKE 1 TABLET BY MOUTH EVERY DAY 90 tablet 3   amoxicillin  (AMOXIL ) 500 MG tablet Take 500 mg by mouth 3 (three) times daily. (Patient not taking: Reported on 04/24/2024)     Current Facility-Administered Medications on File Prior to Visit  Medication Dose Route Frequency Provider  Last Rate Last Admin   inclisiran (LEQVIO ) injection 284 mg  284 mg Subcutaneous Once        "

## 2024-06-09 ENCOUNTER — Encounter: Payer: Self-pay | Admitting: Adult Health

## 2024-06-10 ENCOUNTER — Encounter: Payer: Self-pay | Admitting: Internal Medicine

## 2024-06-11 ENCOUNTER — Ambulatory Visit: Admitting: Adult Health

## 2024-06-12 ENCOUNTER — Encounter: Payer: Self-pay | Admitting: Adult Health

## 2024-06-12 ENCOUNTER — Ambulatory Visit: Admitting: Adult Health

## 2024-06-12 VITALS — BP 110/80 | HR 76 | Temp 97.7°F | Ht 71.0 in | Wt 192.0 lb

## 2024-06-12 DIAGNOSIS — J0140 Acute pansinusitis, unspecified: Secondary | ICD-10-CM

## 2024-06-12 MED ORDER — AMOXICILLIN-POT CLAVULANATE 875-125 MG PO TABS: 1.0000 | ORAL_TABLET | Freq: Two times a day (BID) | ORAL | 0 refills | Status: AC

## 2024-06-12 NOTE — Progress Notes (Signed)
 "  Subjective:    Patient ID: Raymond Long, male    DOB: [birth year], 64 y.o.   MRN: [id number]  HPI Discussed the use of AI scribe software for clinical note transcription with the patient, who gave verbal consent to proceed.  History of Present Illness   Raymond Long is a 64 year old male who presents with persistent sinus pressure and loss of taste and smell.  He has had severe sinus pressure since mid-December 2025 with complete loss of taste and smell, burning in the sinuses, and an abnormal chemical smell when he touches his nose.  He has not improved with amoxicillin , doxycycline , prednisone , neti pot, vaporizer, Afrin, saline sprays, or oral antihistamines. He has ongoing nasal discharge and congestion with ears feeling plugged, needs to blow his nose frequently, and sometimes has trouble breathing through his nose.  He has been on Florical chemotherapy for skin cancer for about a year and wonders if it contributes to his loss of taste and smell Flonase  raises his blood pressure and makes him feel flushed, tingly, and lightheaded, so he avoids it.        Review of Systems See HPI   Past Medical History:  Diagnosis Date   Allergy    Anxiety    Arthralgia of wrist, left    Arthritis    psoriatic arthritis   Basal cell carcinoma    Cancer (HCC)    Skin   Erectile dysfunction    GERD (gastroesophageal reflux disease)    Gout    Foot   Hyperlipidemia    Hypertension    Insomnia    PONV (postoperative nausea and vomiting)     Social History   Socioeconomic History   Marital status: Media Planner    Spouse name: Not on file   Number of children: Not on file   Years of education: Not on file   Highest education level: Not on file  Occupational History   Not on file  Tobacco Use   Smoking status: Former    Current packs/day: 0.00    Average packs/day: 2.0 packs/day for 20.0 years (40.0 ttl pk-yrs)    Types: Cigarettes    Start date: 01/09/1981    Quit  date: 01/09/2001    Years since quitting: 23.4    Passive exposure: Never   Smokeless tobacco: Never   Tobacco comments:    1978-2002, up to 2 ppd  Vaping Use   Vaping status: Never Used  Substance and Sexual Activity   Alcohol use: No    Comment:  in AA   Drug use: No   Sexual activity: Not on file  Other Topics Concern   Not on file  Social History Narrative   He works in Teacher, [employer]   No kids       Likes to be outside and hike.          Social Drivers of Health   Tobacco Use: Medium Risk (06/12/2024)   Patient History    Smoking Tobacco Use: Former    Smokeless Tobacco Use: Never    Passive Exposure: Never  Programmer, Applications: Not on file  Food Insecurity: Not on file  Transportation Needs: Not on file  Physical Activity: Not on file  Stress: Not on file  Social Connections: Not on file  Intimate Partner Violence: Not on file  Depression (PHQ2-9): Low Risk (01/29/2024)   Depression (PHQ2-9)    PHQ-2 Score: 0  Alcohol Screen:  Not on file  Housing: Not on file  Utilities: Not on file  Health Literacy: Not on file    Past Surgical History:  Procedure Laterality Date   COLONOSCOPY     @ 41 due to rectal symptoms   HERNIA REPAIR  2008   Bilateral   JOINT REPLACEMENT     MELANOMA EXCISION  12/13/2015   Located behind right ear   MELANOMA EXCISION WITH SENTINEL LYMPH NODE BIOPSY Left 01/15/2013   Procedure: EXCISION LEFT LEG MELANOMA WITH LEFT INGUINAL SENTINEL NODE BIOPSY;  Surgeon: Donnice Bury, MD;  Location: [facility];  Service: General;  Laterality: Left;   melonma excision     07/27/2023, 11/2023, 01/27/2024   TONSILLECTOMY  age 68   TOTAL HIP ARTHROPLASTY      Family History  Problem Relation Age of Onset   Heart attack Mother        < 73   Dementia Mother    Cancer Mother    Heart attack Father 78   COPD Sister    Breast cancer Sister    Alcohol abuse Paternal Grandfather    Colon cancer Neg Hx     Colon polyps Neg Hx    Esophageal cancer Neg Hx    Rectal cancer Neg Hx    Stomach cancer Neg Hx     Allergies[1]  Medications Ordered Prior to Encounter[2]  BP 110/80   Pulse 76   Temp 97.7 F (36.5 C) (Oral)   Ht 5' 11 (1.803 m)   Wt 192 lb (87.1 kg)   SpO2 97%   BMI 26.78 kg/m       Objective:   Physical Exam Vitals and nursing note reviewed.  Constitutional:      Appearance: Normal appearance.  HENT:     Nose: Rhinorrhea present. Rhinorrhea is purulent.     Right Turbinates: Enlarged and swollen.     Left Turbinates: Enlarged.     Right Sinus: Frontal sinus tenderness present.     Left Sinus: Frontal sinus tenderness present.  Cardiovascular:     Rate and Rhythm: Normal rate and regular rhythm.     Pulses: Normal pulses.     Heart sounds: Normal heart sounds.  Pulmonary:     Effort: Pulmonary effort is normal.     Breath sounds: Normal breath sounds.  Skin:    General: Skin is warm and dry.     Capillary Refill: Capillary refill takes less than 2 seconds.  Neurological:     General: No focal deficit present.     Mental Status: He is alert and oriented to person, place, and time.  Psychiatric:        Mood and Affect: Mood normal.        Behavior: Behavior normal.        Thought Content: Thought content normal.        Judgment: Judgment normal.        Assessment & Plan:  Assessment and Plan    Acute pansinusitis Persistent sinus pressure, anosmia, and dysgeusia since mid-December 2025. Previous treatments ineffective. Differential includes sinus infection or other pathology.  - Prescribed Augmentin  (amoxicillin /clavulanate) twice daily for 10 days. - Ordered CT scan of the sinuses if symptoms persist after antibiotic treatment. - Advised saline nasal spray for symptomatic relief.      Adelina Collard, NP  I personally spent a total of 30 minutes in the care of the patient today including preparing to see the patient, getting/reviewing separately  obtained history, performing a medically appropriate exam/evaluation, counseling and educating, placing orders, and documenting clinical information in the EHR.       [1]  Allergies Allergen Reactions   Allopurinol  Other (See Comments)    Itching, redness, scaling to his skin and swelling in the ankle   Atorvastatin    Citalopram    Ezetimibe-Simvastatin     REACTION: up cpk   Febuxostat  Other (See Comments)   Fluoxetine Hcl    Leqvio  [Inclisiran Sodium ]     Patient states it made his uric acid go up dramatically   Methotrexate  And Trimetrexate Other (See Comments)   Otezla  [Apremilast ]    Pitavastatin     Repatha  [Evolocumab ]    Rosuvastatin      REACTION: up cpk   Venlafaxine    Seroquel  [Quetiapine  Fumarate] Palpitations    Racing thoughts and restless sleep   Trazodone  And Nefazodone Palpitations  [2]  Current Outpatient Medications on File Prior to Visit  Medication Sig Dispense Refill   amLODipine  (NORVASC ) 10 MG tablet Take 1 tablet (10 mg total) by mouth daily. 90 tablet 3   clindamycin (CLEOCIN T) 1 % external solution Apply 1 Application topically daily.     Cyanocobalamin (VITAMIN B-12 PO) Take by mouth.     fluorouracil (EFUDEX) 5 % cream      fluorouracil CALGB 19297 5 GM/100ML SOLN Inject 400 mg/m2 into the vein once.     fluticasone  (FLONASE ) 50 MCG/ACT nasal spray Place 2 sprays into both nostrils daily. 16 g 6   hydrOXYzine (ATARAX/VISTARIL) 25 MG tablet Take 25 mg by mouth at bedtime.     inclisiran (LEQVIO ) 284 MG/1.5ML SOSY injection Inject 284 mg into the skin every 6 (six) months.     ketoconazole (NIZORAL) 2 % shampoo Apply topically Two (2) times a week to beard.     methylPREDNISolone  (MEDROL  DOSEPAK) 4 MG TBPK tablet Take as directed 21 tablet 0   mirtazapine  (REMERON ) 30 MG tablet TAKE 1 TABLET BY MOUTH EVERYDAY AT BEDTIME 90 tablet 3   predniSONE  (DELTASONE ) 10 MG tablet Take 1 tablet (10 mg total) by mouth as needed (for gout flares). Take as  needed for gout flares 20 tablet 0   probenecid  (BENEMID ) 500 MG tablet Take 1 tablet (500 mg total) by mouth daily. 90 tablet 1   telmisartan  (MICARDIS ) 80 MG tablet TAKE 1 TABLET BY MOUTH EVERY DAY 90 tablet 3   Current Facility-Administered Medications on File Prior to Visit  Medication Dose Route Frequency Provider Last Rate Last Admin   inclisiran (LEQVIO ) injection 284 mg  284 mg Subcutaneous Once        "

## 2024-10-06 ENCOUNTER — Ambulatory Visit

## 2024-10-07 ENCOUNTER — Ambulatory Visit: Payer: PRIVATE HEALTH INSURANCE | Admitting: Internal Medicine
